# Patient Record
Sex: Female | Born: 1990 | Race: Black or African American | Hispanic: No | Marital: Single | Smoking: Former smoker
Health system: Southern US, Community
[De-identification: ages and names within clinical notes are randomized; demographics above are authoritative.]

## PROBLEM LIST (undated history)

## (undated) ENCOUNTER — Inpatient Hospital Stay (HOSPITAL_COMMUNITY): Payer: Self-pay

## (undated) DIAGNOSIS — I10 Essential (primary) hypertension: Secondary | ICD-10-CM

## (undated) DIAGNOSIS — F319 Bipolar disorder, unspecified: Secondary | ICD-10-CM

## (undated) DIAGNOSIS — T50902A Poisoning by unspecified drugs, medicaments and biological substances, intentional self-harm, initial encounter: Secondary | ICD-10-CM

## (undated) DIAGNOSIS — Z8041 Family history of malignant neoplasm of ovary: Secondary | ICD-10-CM

## (undated) DIAGNOSIS — K8019 Calculus of gallbladder with other cholecystitis with obstruction: Secondary | ICD-10-CM

## (undated) DIAGNOSIS — F329 Major depressive disorder, single episode, unspecified: Secondary | ICD-10-CM

## (undated) DIAGNOSIS — F32A Depression, unspecified: Secondary | ICD-10-CM

## (undated) DIAGNOSIS — M1990 Unspecified osteoarthritis, unspecified site: Secondary | ICD-10-CM

## (undated) HISTORY — DX: Family history of malignant neoplasm of ovary: Z80.41

---
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
## 2011-05-06 ENCOUNTER — Encounter (HOSPITAL_COMMUNITY): Payer: Self-pay | Admitting: *Deleted

## 2011-05-06 ENCOUNTER — Emergency Department (HOSPITAL_COMMUNITY)
Admission: EM | Admit: 2011-05-06 | Discharge: 2011-05-06 | Disposition: A | Discharge status: Home / Self Care | Payer: Medicaid Other | Attending: Emergency Medicine | Admitting: Emergency Medicine

## 2011-05-06 DIAGNOSIS — F17200 Nicotine dependence, unspecified, uncomplicated: Secondary | ICD-10-CM | POA: Insufficient documentation

## 2011-05-06 DIAGNOSIS — R0981 Nasal congestion: Secondary | ICD-10-CM

## 2011-05-06 DIAGNOSIS — J45909 Unspecified asthma, uncomplicated: Secondary | ICD-10-CM | POA: Insufficient documentation

## 2011-05-06 DIAGNOSIS — J3489 Other specified disorders of nose and nasal sinuses: Secondary | ICD-10-CM | POA: Insufficient documentation

## 2011-05-06 DIAGNOSIS — J029 Acute pharyngitis, unspecified: Secondary | ICD-10-CM | POA: Insufficient documentation

## 2011-05-06 DIAGNOSIS — F3289 Other specified depressive episodes: Secondary | ICD-10-CM | POA: Insufficient documentation

## 2011-05-06 DIAGNOSIS — F329 Major depressive disorder, single episode, unspecified: Secondary | ICD-10-CM | POA: Insufficient documentation

## 2011-05-06 HISTORY — DX: Depression, unspecified: F32.A

## 2011-05-06 HISTORY — DX: Major depressive disorder, single episode, unspecified: F32.9

## 2011-05-06 LAB — POCT I-STAT, CHEM 8
Glucose, Bld: 90 mg/dL (ref 70–99)
HCT: 42 % (ref 36.0–46.0)
Hemoglobin: 14.3 g/dL (ref 12.0–15.0)
Potassium: 4 mEq/L (ref 3.5–5.1)
Sodium: 139 mEq/L (ref 135–145)

## 2011-05-06 LAB — RAPID STREP SCREEN (MED CTR MEBANE ONLY): Streptococcus, Group A Screen (Direct): NEGATIVE

## 2011-05-06 MED ORDER — IBUPROFEN 800 MG PO TABS
800.0000 mg | ORAL_TABLET | Freq: Once | ORAL | Status: AC
  Administered 2011-05-06: 800 mg via ORAL
  Filled 2011-05-06: qty 1

## 2011-05-06 NOTE — ED Notes (Signed)
The pt arrived by ambulance with a cold sorethroat ear stuffiness and  Congestion for one week

## 2011-05-06 NOTE — Discharge Instructions (Signed)
Return to the ED with any concerns including shortness of breath, vomiting and not able to keep down liquids, difficulty swallowing or breathing, or any other alarming symptoms.  He used to take ibuprofen as needed for your sore throat and be sure to increase your fluid intake. He should also arrange for followup if her symptoms persist with your primary care Dr.

## 2011-05-06 NOTE — ED Provider Notes (Signed)
History     CSN: [id number]  Arrival date & time 05/06/11  2034   First MD Initiated Contact with Patient 05/06/11 2155      Chief Complaint  Patient presents with  . Sore Throat    (Consider location/radiation/quality/duration/timing/severity/associated sxs/prior treatment) HPI Patient presents with complaint of nasal congestion mild sore throat and mild congestion in her chest. She states the symptoms have been present over the past one week. She arrived via EMS. She's had no fever or difficulty breathing. She's had no vomiting. She states that she's continued to drink and eat normally. She does have a history of asthma and has been using her albuterol inhaler last time just shortly prior to arrival. It has not made any change in her symptoms. There no other associated systemic symptoms. She does not know of any specific sick contacts. There are no alleviating or modifying factors.  Symptoms are constant and described as mild  Past Medical History  Diagnosis Date  . Depression     History reviewed. No pertinent past surgical history.  No family history on file.  History  Substance Use Topics  . Smoking status: Current Everyday Smoker  . Smokeless tobacco: Not on file  . Alcohol Use: Yes    OB History    Grav Para Term Preterm Abortions TAB SAB Ect Mult Living                  Review of Systems ROS reviewed and all otherwise negative except for mentioned in HPI  Allergies  Review of patient's allergies indicates no known allergies.  Home Medications   Current Outpatient Rx  Name Route Sig Dispense Refill  . ALBUTEROL SULFATE HFA 108 (90 BASE) MCG/ACT IN AERS Inhalation Inhale 2 puffs into the lungs every 6 (six) hours as needed. For wheezing/shortness of breath    . CETIRIZINE HCL 10 MG PO TABS Oral Take 10 mg by mouth daily.      BP 119/80  Pulse 107  Temp(Src) 98.5 F (36.9 C) (Oral)  Resp 18  SpO2 99% Vitals reviewed Physical Exam Physical  Examination: General appearance - alert, well appearing, and in no distress Mental status - alert, oriented to person, place, and time Eyes - pupils equal and reactive, no conjunctival injection Mouth - mmm, op with mild erythema, no exudate, palate symmetric and uvula midline Neck - supple, no significant adenopathy Chest - clear to auscultation, no wheezes, rales or rhonchi, symmetric air entry, no increased respiratory effort Heart - normal rate, regular rhythm, normal S1, S2, no murmurs, rubs, clicks or gallops Abdomen - soft, nontender, nondistended, no masses or organomegaly Extremities - peripheral pulses normal, no pedal edema, no clubbing or cyanosis Skin - normal coloration and turgor, no rashes, no suspicious skin lesions noted Psych- laughing, cooperative  ED Course  Procedures (including critical care time)  9:59 PM went to see patient, she is not in room   Labs Reviewed  RAPID STREP SCREEN  POCT I-STAT, CHEM 8  LAB REPORT - SCANNED   No results found.   1. Pharyngitis   2. Nasal congestion       MDM   patient presenting with nasal congestion and symptoms of mild sore throat and congestion in her chest. She has been using albuterol inhaler and was noted to be mildly tachycardiac upon arrival. This did improve. She does not appear dehydrated and states that she's been drinking liquids normally. I believe the tachycardia is most likely related to recent albuterol  use.  Her lungs are clear and there is no wheezing or increased respiratory effort. A rapid strep screen was obtained which was negative. She is overall well appearing on examination. Of note patient was laughing and jovial throughout the entire examination and history. Advised her to continue her albuterol as needed and continue taking Zyrtec. She was discharged with strict return precautions and she is agreeable with this plan.        Ethelda Chick, MD 05/11/11 [phone]

## 2011-07-16 ENCOUNTER — Emergency Department (HOSPITAL_COMMUNITY)
Admission: EM | Admit: 2011-07-16 | Discharge: 2011-07-17 | Disposition: A | Discharge status: Home / Self Care | Payer: Medicaid Other | Attending: Emergency Medicine | Admitting: Emergency Medicine

## 2011-07-16 ENCOUNTER — Encounter (HOSPITAL_COMMUNITY): Payer: Self-pay

## 2011-07-16 DIAGNOSIS — T424X4A Poisoning by benzodiazepines, undetermined, initial encounter: Secondary | ICD-10-CM | POA: Insufficient documentation

## 2011-07-16 DIAGNOSIS — T424X1A Poisoning by benzodiazepines, accidental (unintentional), initial encounter: Secondary | ICD-10-CM | POA: Insufficient documentation

## 2011-07-16 DIAGNOSIS — F3289 Other specified depressive episodes: Secondary | ICD-10-CM | POA: Insufficient documentation

## 2011-07-16 DIAGNOSIS — F329 Major depressive disorder, single episode, unspecified: Secondary | ICD-10-CM | POA: Insufficient documentation

## 2011-07-16 DIAGNOSIS — F17200 Nicotine dependence, unspecified, uncomplicated: Secondary | ICD-10-CM | POA: Insufficient documentation

## 2011-07-16 DIAGNOSIS — Y92009 Unspecified place in unspecified non-institutional (private) residence as the place of occurrence of the external cause: Secondary | ICD-10-CM | POA: Insufficient documentation

## 2011-07-16 DIAGNOSIS — Z79899 Other long term (current) drug therapy: Secondary | ICD-10-CM | POA: Insufficient documentation

## 2011-07-16 DIAGNOSIS — G4700 Insomnia, unspecified: Secondary | ICD-10-CM | POA: Insufficient documentation

## 2011-07-16 HISTORY — DX: Poisoning by unspecified drugs, medicaments and biological substances, intentional self-harm, initial encounter: T50.902A

## 2011-07-16 NOTE — ED Notes (Signed)
YNW:[id number]<OZ> Expected date:07/16/11<BR> Expected time:11:22 PM<BR> Means of arrival:Ambulance<BR> Comments:<BR> Overdose

## 2011-07-16 NOTE — ED Notes (Signed)
Per [facility]- Pt reports ingestion of ativan 1mg  (20 tablets) over 2-3 hours.  Also reports ingestion of tylenol 2 tablets.  Pt present alert and active with care.  #18 LAC saline lock.  Pt denies SI states "wants to sleep"  VSS

## 2011-07-16 NOTE — ED Notes (Signed)
MD at bedside. EDP Miller 

## 2011-07-17 LAB — PREGNANCY, URINE: Preg Test, Ur: NEGATIVE

## 2011-07-17 LAB — URINALYSIS, ROUTINE W REFLEX MICROSCOPIC
Bilirubin Urine: NEGATIVE
Glucose, UA: NEGATIVE mg/dL
Hgb urine dipstick: NEGATIVE
Specific Gravity, Urine: 1.011 (ref 1.005–1.030)
pH: 7 (ref 5.0–8.0)

## 2011-07-17 LAB — COMPREHENSIVE METABOLIC PANEL
ALT: 14 U/L (ref 0–35)
Albumin: 4.1 g/dL (ref 3.5–5.2)
Alkaline Phosphatase: 65 U/L (ref 39–117)
Chloride: 100 mEq/L (ref 96–112)
Glucose, Bld: 91 mg/dL (ref 70–99)
Potassium: 4 mEq/L (ref 3.5–5.1)
Sodium: 135 mEq/L (ref 135–145)
Total Protein: 7.6 g/dL (ref 6.0–8.3)

## 2011-07-17 LAB — CBC
Hemoglobin: 13.2 g/dL (ref 12.0–15.0)
MCHC: 33.6 g/dL (ref 30.0–36.0)
RDW: 12.7 % (ref 11.5–15.5)
WBC: 5.6 10*3/uL (ref 4.0–10.5)

## 2011-07-17 LAB — ACETAMINOPHEN LEVEL: Acetaminophen (Tylenol), Serum: 19.4 ug/mL (ref 10–30)

## 2011-07-17 LAB — RAPID URINE DRUG SCREEN, HOSP PERFORMED: Tetrahydrocannabinol: NOT DETECTED

## 2011-07-17 LAB — SALICYLATE LEVEL: Salicylate Lvl: <2 mg/dL — ABNORMAL LOW (ref 2.8–20.0)

## 2011-07-17 NOTE — ED Provider Notes (Signed)
History     CSN: [id number]  Arrival date & time 07/16/11  2336   First MD Initiated Contact with Patient 07/16/11 2358      Chief Complaint  Patient presents with  . Drug Overdose    (Consider location/radiation/quality/duration/timing/severity/associated sxs/prior treatment) HPI Comments: 21 year old female with a history of recently being diagnosed with bipolar disorder and depression who also has been given a sleeping pill for difficulty with sleep. She states that this evening she had been having a tough time going to sleep and states that she wanted to get her schedule on track for starting summer school. She took 2 Ativan tablets of 1 mg each, this did not do anything so short time later she took 3 tablets, then 4 tablets, then 5 tablets. After taking approximately 20 mg of Ativan and 2 tablets of acetaminophen and not being sleepy, she called her girlfriend to let her to. Her girlfriend called the ambulance for transport to 2 overdose. The patient denies being depressed or suicidal but does state that she has some stressors including difficulty paying rent which is due tomorrow. She states that she has recently been in counseling discussions with her girlfriend because of difficulties with their relationship but states that it is going well at this time. She adamantly denies that this was a suicide attempt but states that it was wanting to go to sleep.  Patient is a 21 y.o. female presenting with Overdose. The history is provided by the patient and the EMS personnel.  Drug Overdose    Past Medical History  Diagnosis Date  . Depression   . Suicidal overdose     History reviewed. No pertinent past surgical history.  No family history on file.  History  Substance Use Topics  . Smoking status: Current Everyday Smoker  . Smokeless tobacco: Not on file  . Alcohol Use: Yes    OB History    Grav Para Term Preterm Abortions TAB SAB Ect Mult Living                  Review  of Systems  All other systems reviewed and are negative.    Allergies  Review of patient's allergies indicates no known allergies.  Home Medications   Current Outpatient Rx  Name Route Sig Dispense Refill  . ACETAMINOPHEN 500 MG PO TABS Oral Take 500 mg by mouth every 6 (six) hours as needed. For pain    . ALBUTEROL SULFATE HFA 108 (90 BASE) MCG/ACT IN AERS Inhalation Inhale 2 puffs into the lungs every 6 (six) hours as needed. For wheezing/shortness of breath    . CETIRIZINE HCL 10 MG PO TABS Oral Take 10 mg by mouth daily.    Marland Kitchen LORAZEPAM 1 MG PO TABS Oral Take 0.5-1 mg by mouth at bedtime as needed. For sleep      BP 138/99  Pulse 118  Temp 98.9 F (37.2 C) (Oral)  Resp 20  SpO2 100%  Physical Exam  Nursing note and vitals reviewed. Constitutional: She appears well-developed and well-nourished.  HENT:  Head: Normocephalic and atraumatic.  Mouth/Throat: Oropharynx is clear and moist. No oropharyngeal exudate.  Eyes: Conjunctivae and EOM are normal. Pupils are equal, round, and reactive to light. Right eye exhibits no discharge. Left eye exhibits no discharge. No scleral icterus.  Neck: Normal range of motion. Neck supple. No JVD present. No thyromegaly present.  Cardiovascular: Normal rate, regular rhythm, normal heart sounds and intact distal pulses.  Exam reveals no gallop and  no friction rub.   No murmur heard. Pulmonary/Chest: Effort normal and breath sounds normal. No respiratory distress. She has no wheezes. She has no rales.  Abdominal: Soft. Bowel sounds are normal. She exhibits no distension and no mass. There is no tenderness.  Musculoskeletal: Normal range of motion. She exhibits no edema and no tenderness.  Lymphadenopathy:    She has no cervical adenopathy.  Neurological: She is alert. Coordination normal.       Clear speech, normal coordination, no tremor, no facial droop  Skin: Skin is warm and dry. No rash noted. No erythema.  Psychiatric:       Tearful,  no suicidal thoughts, no hallucinations, depressed affect    ED Course  Procedures (including critical care time)  Labs Reviewed  SALICYLATE LEVEL - Abnormal; Notable for the following:    Salicylate Lvl <2.0 (*)     All other components within normal limits  CBC  COMPREHENSIVE METABOLIC PANEL  ETHANOL  URINE RAPID DRUG SCREEN (HOSP PERFORMED)  URINALYSIS, ROUTINE W REFLEX MICROSCOPIC  PREGNANCY, URINE  ACETAMINOPHEN LEVEL   No results found.   1. Overdose of benzodiazepine   2. Insomnia       MDM  At this time the patient does appear depressed and tearful, she denies that there was any part of this that was suicidal, will wait for significant other to get further information regarding the patient's mental state at this time. We'll have behavioral health assessment team to evaluate patient as well. Levels ordered,  blood pressure stable at this time. EKG and coingestants labs ordered.   ED ECG REPORT   Date: 07/17/2011 I personally interpreted this ECG  Rate: 110  Rhythm: sinus tachycardia  QRS Axis: normal  Intervals: normal  ST/T Wave abnormalities: normal  Conduction Disutrbances:none  Narrative Interpretation:   Old EKG Reviewed: c/w 04/30/10, T wave abnormalities less abnormal in lateral precordial leads  Patient has been evaluated by ACT team who has agreed that she is stable for discharge, this was not a suicide attempt. Patient has been sleeping, easily arousable and agreeable to discharge with return should her symptoms worsen. Laboratory workup otherwise unremarkable    Vida Roller, MD 07/17/11 [phone]

## 2011-07-17 NOTE — ED Notes (Signed)
ACT team spoke with this pt

## 2011-07-17 NOTE — ED Notes (Signed)
Pt resting with eyes closed.
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
## 2011-07-17 NOTE — Discharge Instructions (Signed)
Please don't take any more of the ativan - this is not a sleep medicine.  It is a sedative and will make you sleepy and groggy throughout the day today.  If you develop worsening depression or any suicidal thoughts return to the hospital immediately. Call your therapist in the morning to schedule a followup appointment to reevaluate your difficulty sleeping and ongoing depression.

## 2011-07-17 NOTE — ED Notes (Signed)
Pt changed into blue scrubs, wanded by [employer]. Pt has piercing on bottom of lip left side that will not come out (imbeded in lip). Charge RN made aware.

## 2011-07-17 NOTE — ED Notes (Signed)
Gave pt. [place] sandwich, sprite and cheese stick after she stated she was hungry and RN cleared her to eat.

## 2011-08-17 ENCOUNTER — Encounter (HOSPITAL_COMMUNITY): Payer: Self-pay | Admitting: *Deleted

## 2011-08-17 ENCOUNTER — Emergency Department (HOSPITAL_COMMUNITY)
Admission: EM | Admit: 2011-08-17 | Discharge: 2011-08-18 | Disposition: A | Discharge status: Home / Self Care | Payer: Medicaid Other | Source: Home / Self Care | Attending: Emergency Medicine | Admitting: Emergency Medicine

## 2011-08-17 DIAGNOSIS — F329 Major depressive disorder, single episode, unspecified: Secondary | ICD-10-CM | POA: Insufficient documentation

## 2011-08-17 DIAGNOSIS — J069 Acute upper respiratory infection, unspecified: Secondary | ICD-10-CM | POA: Insufficient documentation

## 2011-08-17 DIAGNOSIS — Z79899 Other long term (current) drug therapy: Secondary | ICD-10-CM | POA: Insufficient documentation

## 2011-08-17 DIAGNOSIS — B354 Tinea corporis: Secondary | ICD-10-CM | POA: Insufficient documentation

## 2011-08-17 DIAGNOSIS — F17200 Nicotine dependence, unspecified, uncomplicated: Secondary | ICD-10-CM | POA: Insufficient documentation

## 2011-08-17 DIAGNOSIS — F3289 Other specified depressive episodes: Secondary | ICD-10-CM | POA: Insufficient documentation

## 2011-08-17 NOTE — ED Notes (Addendum)
Per [facility] - pt c/o congestion, sore throat, n/v, and fever that began approx 2000 this evening.

## 2011-08-18 ENCOUNTER — Encounter (HOSPITAL_COMMUNITY): Payer: Self-pay | Admitting: Family Medicine

## 2011-08-18 ENCOUNTER — Emergency Department (HOSPITAL_COMMUNITY)
Admission: EM | Admit: 2011-08-18 | Discharge: 2011-08-18 | Disposition: A | Discharge status: Home / Self Care | Payer: Medicaid Other | Attending: Emergency Medicine | Admitting: Emergency Medicine

## 2011-08-18 DIAGNOSIS — B49 Unspecified mycosis: Secondary | ICD-10-CM

## 2011-08-18 MED ORDER — PSEUDOEPHEDRINE HCL ER 120 MG PO TB12: 120.0000 mg | ORAL_TABLET | Freq: Two times a day (BID) | ORAL | Status: DC

## 2011-08-18 MED ORDER — CLOTRIMAZOLE 1 % EX CREA: TOPICAL_CREAM | CUTANEOUS | Status: DC

## 2011-08-18 MED ORDER — PSEUDOEPHEDRINE HCL ER 120 MG PO TB12
120.0000 mg | ORAL_TABLET | Freq: Two times a day (BID) | ORAL | Status: DC
  Administered 2011-08-18: 120 mg via ORAL
  Filled 2011-08-18: qty 1

## 2011-08-18 NOTE — ED Notes (Signed)
Patient states that she was here earlier for a sinus infection and thinks she may be having a reaction to the medication she received. States that approx 2 hours after leaving she noticed "bruising" to her left side. Area of redness noted underneath left breast.

## 2011-08-18 NOTE — ED Provider Notes (Signed)
Medical screening examination/treatment/procedure(s) were performed by non-physician practitioner and as supervising physician I was immediately available for consultation/collaboration.  Jasmine Awe, MD 08/18/11 [phone]

## 2011-08-18 NOTE — ED Provider Notes (Addendum)
History     CSN: [id number]  Arrival date & time 08/17/11  2213   First MD Initiated Contact with Patient 08/18/11 0001      Chief Complaint  Patient presents with  . Nasal Congestion  . Emesis    (Consider location/radiation/quality/duration/timing/severity/associated sxs/prior treatment) HPI Comments: URI symptoms   The history is provided by the patient.    Past Medical History  Diagnosis Date  . Depression   . Suicidal overdose     History reviewed. No pertinent past surgical history.  History reviewed. No pertinent family history.  History  Substance Use Topics  . Smoking status: Current Everyday Smoker  . Smokeless tobacco: Not on file  . Alcohol Use: Yes    OB History    Grav Para Term Preterm Abortions TAB SAB Ect Mult Living                  Review of Systems  Constitutional: Positive for fever. Negative for chills.  HENT: Positive for congestion and sore throat.   Gastrointestinal: Positive for nausea and vomiting.  Neurological: Positive for headaches. Negative for dizziness and weakness.    Allergies  Review of patient's allergies indicates no known allergies.  Home Medications   Current Outpatient Rx  Name Route Sig Dispense Refill  . ACETAMINOPHEN 500 MG PO TABS Oral Take 500 mg by mouth every 6 (six) hours as needed. For pain    . ALBUTEROL SULFATE HFA 108 (90 BASE) MCG/ACT IN AERS Inhalation Inhale 2 puffs into the lungs every 6 (six) hours as needed. For wheezing/shortness of breath    . CETIRIZINE HCL 10 MG PO TABS Oral Take 10 mg by mouth daily.    Marland Kitchen LORAZEPAM 1 MG PO TABS Oral Take 0.5-1 mg by mouth at bedtime as needed. For sleep    . PSEUDOEPHEDRINE HCL ER 120 MG PO TB12 Oral Take 1 tablet (120 mg total) by mouth every 12 (twelve) hours. 20 tablet 0    BP 117/67  Pulse 74  Temp 98.4 F (36.9 C) (Oral)  Resp 22  Ht 5\' 5"  (1.651 m)  SpO2 100%  Physical Exam  Constitutional: She is oriented to person, place, and time. She  appears well-developed.  HENT:  Head: Normocephalic.  Eyes: Pupils are equal, round, and reactive to light.  Neck: Normal range of motion.  Cardiovascular: Normal rate.   Pulmonary/Chest: Effort normal. No respiratory distress. She has no wheezes. She has no rales.  Abdominal: Soft. She exhibits no distension. There is no tenderness.  Musculoskeletal: Normal range of motion.  Neurological: She is alert and oriented to person, place, and time.  Skin: Skin is warm.    ED Course  Procedures (including critical care time)  Labs Reviewed - No data to display No results found.   1. URI (upper respiratory infection)       MDM  URI        Arman Filter, NP 08/18/11 0038  Arman Filter, NP 10/19/11 2005

## 2011-08-18 NOTE — ED Provider Notes (Signed)
Medical screening examination/treatment/procedure(s) were performed by non-physician practitioner and as supervising physician I was immediately available for consultation/collaboration.  Rosemary Mossbarger K Yolanda Huffstetler-Rasch, MD 08/18/11 0414 

## 2011-08-18 NOTE — ED Provider Notes (Signed)
History     CSN: [id number]  Arrival date & time 08/18/11  0228   None     Chief Complaint  Patient presents with  . Rash    (Consider location/radiation/quality/duration/timing/severity/associated sxs/prior treatment) HPI  Patient to the ER for complaints of "bruise" to left ribs. She says that she was seen here in the ED last night and treated with Sudafed for nasal congestion. After leaving the hospital she noticed that she developed a tender area under her left breast. It does not itch, it is not bleeding, their is no puss. Denies trauma or injury. She denies fevers or the use of antibiotics. She denies having had this reaction in the past. VSS/NAD, afebrile,   Past Medical History  Diagnosis Date  . Depression   . Suicidal overdose     History reviewed. No pertinent past surgical history.  No family history on file.  History  Substance Use Topics  . Smoking status: Current Everyday Smoker  . Smokeless tobacco: Not on file  . Alcohol Use: Yes    OB History    Grav Para Term Preterm Abortions TAB SAB Ect Mult Living                  Review of Systems   HEENT: denies blurry vision or change in hearing PULMONARY: Denies difficulty breathing and SOB CARDIAC: denies chest pain or heart palpitations SKIN: + new rash PSYCH: patient denies anxiety or depression. NECK: Pt denies having neck pain     Allergies  Review of patient's allergies indicates no known allergies.  Home Medications   Current Outpatient Rx  Name Route Sig Dispense Refill  . ACETAMINOPHEN 500 MG PO TABS Oral Take 500 mg by mouth every 6 (six) hours as needed. For pain    . ALBUTEROL SULFATE HFA 108 (90 BASE) MCG/ACT IN AERS Inhalation Inhale 2 puffs into the lungs every 6 (six) hours as needed. For wheezing/shortness of breath    . CETIRIZINE HCL 10 MG PO TABS Oral Take 10 mg by mouth daily.    Marland Kitchen LORAZEPAM 1 MG PO TABS Oral Take 0.5-1 mg by mouth at bedtime as needed. For sleep    .  PSEUDOEPHEDRINE HCL ER 120 MG PO TB12 Oral Take 1 tablet (120 mg total) by mouth every 12 (twelve) hours. 20 tablet 0  . CLOTRIMAZOLE 1 % EX CREA  Apply to affected area 2 times daily 15 g 0    BP 124/72  Pulse 83  Temp 98.5 F (36.9 C) (Oral)  Resp 18  SpO2 100%  LMP 08/09/2011  Physical Exam  Nursing note and vitals reviewed. Constitutional: She appears well-developed and well-nourished. No distress.  HENT:  Head: Normocephalic and atraumatic.  Eyes: Pupils are equal, round, and reactive to light.  Neck: Normal range of motion. Neck supple.  Cardiovascular: Normal rate and regular rhythm.   Pulmonary/Chest: Effort normal.  Abdominal: Soft.  Neurological: She is alert.  Skin: Skin is warm and dry. Rash noted.          Their is a small area of raw appearing skin under the left breast that measures approx 5 cm x 4 cm in a circular formation with central clearing. She states that it is tender and slightly itchy.  No induration, crepitus, drainage.     ED Course  Procedures (including critical care time)  Labs Reviewed - No data to display No results found.   1. Fungal infection       MDM  Pt Rx antifungal cream and education about keeping skin under breasts dry, such as using powders under her breasts.  Pt has been advised of the symptoms that warrant their return to the ED. Patient has voiced understanding and has agreed to follow-up with the PCP or specialist.         Dorthula Matas, PA 08/18/11 [phone]

## 2011-09-13 ENCOUNTER — Emergency Department (HOSPITAL_COMMUNITY): Payer: Medicaid Other

## 2011-09-13 ENCOUNTER — Emergency Department (HOSPITAL_COMMUNITY)
Admission: EM | Admit: 2011-09-13 | Discharge: 2011-09-13 | Disposition: A | Discharge status: Home / Self Care | Payer: Medicaid Other | Attending: Emergency Medicine | Admitting: Emergency Medicine

## 2011-09-13 ENCOUNTER — Encounter (HOSPITAL_COMMUNITY): Payer: Self-pay | Admitting: Emergency Medicine

## 2011-09-13 DIAGNOSIS — IMO0002 Reserved for concepts with insufficient information to code with codable children: Secondary | ICD-10-CM | POA: Insufficient documentation

## 2011-09-13 DIAGNOSIS — Y92009 Unspecified place in unspecified non-institutional (private) residence as the place of occurrence of the external cause: Secondary | ICD-10-CM | POA: Insufficient documentation

## 2011-09-13 DIAGNOSIS — F319 Bipolar disorder, unspecified: Secondary | ICD-10-CM | POA: Insufficient documentation

## 2011-09-13 DIAGNOSIS — S6990XA Unspecified injury of unspecified wrist, hand and finger(s), initial encounter: Secondary | ICD-10-CM | POA: Insufficient documentation

## 2011-09-13 DIAGNOSIS — F17200 Nicotine dependence, unspecified, uncomplicated: Secondary | ICD-10-CM | POA: Insufficient documentation

## 2011-09-13 HISTORY — DX: Bipolar disorder, unspecified: F31.9

## 2011-09-13 MED ORDER — IBUPROFEN 800 MG PO TABS
800.0000 mg | ORAL_TABLET | Freq: Once | ORAL | Status: AC
  Administered 2011-09-13: 800 mg via ORAL
  Filled 2011-09-13: qty 1

## 2011-09-13 NOTE — ED Notes (Signed)
HQI:ON62<XB> Expected date:<BR> Expected time:<BR> Means of arrival:<BR> Comments:<BR> EMS/punched a wall-hand pain

## 2011-09-13 NOTE — ED Provider Notes (Signed)
History     CSN: [id number]  Arrival date & time 09/13/11  0208   First MD Initiated Contact with Patient 09/13/11 0240      Chief Complaint  Patient presents with  . Hand Injury    (Consider location/radiation/quality/duration/timing/severity/associated sxs/prior treatment) Patient is a 21 y.o. female presenting with hand injury. The history is provided by the patient.  Hand Injury  The incident occurred less than 1 hour ago. The incident occurred at home. The injury mechanism was a direct blow. The pain is present in the right hand. The quality of the pain is described as aching. The pain is at a severity of 4/10. The pain is mild. The pain has been constant since the incident. Pertinent negatives include no fever. The symptoms are aggravated by movement and palpation. She has tried nothing for the symptoms.    Past Medical History  Diagnosis Date  . Depression   . Suicidal overdose   . Bipolar 1 disorder     History reviewed. No pertinent past surgical history.  No family history on file.  History  Substance Use Topics  . Smoking status: Current Some Day Smoker  . Smokeless tobacco: Never Used  . Alcohol Use: 12.0 oz/week    20 Shots of liquor per week     vodka 2-3 times weekly    OB History    Grav Para Term Preterm Abortions TAB SAB Ect Mult Living                  Review of Systems  Constitutional: Negative for fever.  Musculoskeletal: Positive for joint swelling.  Neurological: Negative for weakness and numbness.    Allergies  Review of patient's allergies indicates no known allergies.  Home Medications   Current Outpatient Rx  Name Route Sig Dispense Refill  . ACETAMINOPHEN 500 MG PO TABS Oral Take 500 mg by mouth every 6 (six) hours as needed. For pain    . ALBUTEROL SULFATE HFA 108 (90 BASE) MCG/ACT IN AERS Inhalation Inhale 2 puffs into the lungs every 6 (six) hours as needed. For wheezing/shortness of breath    . CETIRIZINE HCL 10 MG PO TABS  Oral Take 10 mg by mouth daily.    Marland Kitchen CLOTRIMAZOLE 1 % EX CREA  Apply to affected area 2 times daily 15 g 0  . LORAZEPAM 1 MG PO TABS Oral Take 0.5-1 mg by mouth at bedtime as needed. For sleep    . PSEUDOEPHEDRINE HCL ER 120 MG PO TB12 Oral Take 1 tablet (120 mg total) by mouth every 12 (twelve) hours. 20 tablet 0    BP 118/69  Pulse 119  Temp 98.5 F (36.9 C) (Oral)  Resp 20  SpO2 97%  LMP 08/09/2011  Physical Exam  Constitutional: She appears well-nourished.  Eyes: Pupils are equal, round, and reactive to light.  Cardiovascular: Normal rate.   Musculoskeletal: She exhibits tenderness. She exhibits no edema.       Hands: Skin: Skin is warm and dry. There is erythema.    ED Course  Procedures (including critical care time)  Labs Reviewed - No data to display Dg Hand Complete Right  09/13/2011  *RADIOLOGY REPORT*  Clinical Data: Hand pain status post trauma.  RIGHT HAND - COMPLETE 3+ VIEW  Comparison: 04/13/2010  Findings: Suboptimal examination as the patient is unable to extend her fingers.  Furthermore, due to the positioning, the carpal bones/base of the metacarpals are nondiagnostic, requiring dedicated wrist views if there is clinical concern  at this level. Soft tissue swelling overlies the dorsum of the hand. No displaced fracture or dislocation identified.  IMPRESSION: Flexed second through fifth digits at the PIP joints limits osseous evaluation.  No displaced fracture is identified. See above.   Original Report Authenticated By: Waneta Martins, M.D.      1. Hand injury       MDM  Will review xray         Arman Filter, NP 09/13/11 0325  Arman Filter, NP 09/13/11 [phone]

## 2011-09-13 NOTE — ED Provider Notes (Signed)
Medical screening examination/treatment/procedure(s) were performed by non-physician practitioner and as supervising physician I was immediately available for consultation/collaboration.   Lyanne Co, MD 09/13/11 [phone]

## 2011-09-13 NOTE — ED Notes (Signed)
While receiving bedside reporting, pt stated she wanted to know when "the woman was going to look at her hand." She then began to say she is impatient and does not have a problem punching one of our walls. Stated she "wouldn't break the wall", but "would just punch it. " Instructed pt to not punch the wall because there would be consequences to her actions if performed. Nurse Vance Gather notified.

## 2011-09-13 NOTE — ED Notes (Signed)
Pt "punched" wall, (fiber board) with right hand, has mild swelling, c/o pain in right hand. Called EMS, not treatment PTA, refused to use ice that was offered. Pt demeanor giggly, and euphoric upon arrival

## 2011-10-20 NOTE — ED Provider Notes (Signed)
Medical screening examination/treatment/procedure(s) were performed by non-physician practitioner and as supervising physician I was immediately available for consultation/collaboration.  Jasmine Awe, MD 10/20/11 [phone]

## 2012-03-03 ENCOUNTER — Emergency Department (HOSPITAL_COMMUNITY)
Admission: EM | Admit: 2012-03-03 | Discharge: 2012-03-03 | Disposition: A | Discharge status: Home / Self Care | Payer: Medicaid Other | Attending: Emergency Medicine | Admitting: Emergency Medicine

## 2012-03-03 ENCOUNTER — Encounter (HOSPITAL_COMMUNITY): Payer: Self-pay | Admitting: Emergency Medicine

## 2012-03-03 DIAGNOSIS — Z3202 Encounter for pregnancy test, result negative: Secondary | ICD-10-CM | POA: Insufficient documentation

## 2012-03-03 DIAGNOSIS — M549 Dorsalgia, unspecified: Secondary | ICD-10-CM | POA: Insufficient documentation

## 2012-03-03 DIAGNOSIS — F17200 Nicotine dependence, unspecified, uncomplicated: Secondary | ICD-10-CM | POA: Insufficient documentation

## 2012-03-03 DIAGNOSIS — R1110 Vomiting, unspecified: Secondary | ICD-10-CM | POA: Insufficient documentation

## 2012-03-03 DIAGNOSIS — Z8659 Personal history of other mental and behavioral disorders: Secondary | ICD-10-CM | POA: Insufficient documentation

## 2012-03-03 DIAGNOSIS — R1084 Generalized abdominal pain: Secondary | ICD-10-CM | POA: Insufficient documentation

## 2012-03-03 LAB — POCT I-STAT, CHEM 8
Calcium, Ion: 1.1 mmol/L — ABNORMAL LOW (ref 1.12–1.23)
Glucose, Bld: 102 mg/dL — ABNORMAL HIGH (ref 70–99)
HCT: 39 % (ref 36.0–46.0)
Hemoglobin: 13.3 g/dL (ref 12.0–15.0)

## 2012-03-03 LAB — POCT PREGNANCY, URINE: Preg Test, Ur: NEGATIVE

## 2012-03-03 MED ORDER — ONDANSETRON 4 MG PO TBDP
4.0000 mg | ORAL_TABLET | Freq: Once | ORAL | Status: AC
  Administered 2012-03-03: 4 mg via ORAL
  Filled 2012-03-03: qty 1

## 2012-03-03 MED ORDER — KETOROLAC TROMETHAMINE 60 MG/2ML IM SOLN
60.0000 mg | Freq: Once | INTRAMUSCULAR | Status: AC
  Administered 2012-03-03: 60 mg via INTRAMUSCULAR
  Filled 2012-03-03: qty 2

## 2012-03-03 MED ORDER — ONDANSETRON HCL 4 MG PO TABS: 4.0000 mg | ORAL_TABLET | Freq: Three times a day (TID) | ORAL | Status: DC | PRN

## 2012-03-03 NOTE — ED Provider Notes (Signed)
History     CSN: [id number]  Arrival date & time 03/03/12  1123   First MD Initiated Contact with Patient 03/03/12 1201      Chief Complaint  Patient presents with  . Emesis  . Back Pain    (Consider location/radiation/quality/duration/timing/severity/associated sxs/prior treatment) HPI Comments: This is a 22 year old, female, PMH of bipolar and depression, who presents to the emergency department with a chief complaint of vomiting.  Patient states that the vomiting started this morning.  She denies any gross blood, but states that after she finished vomiting, she noticed a little bit of streaking.  She also endorses crampy abdominal pain, which she believes is from the retching.  She denies fever, chills, CP, SOB, diarrhea, constipation, numbness or tingling of the extremities.  She states that her pain is moderate now.  She has not tried taking anything to alleviate her symptoms.  Nothing makes her symptoms better or worse.  The history is provided by the patient. No language interpreter was used.    Past Medical History  Diagnosis Date  . Depression   . Suicidal overdose   . Bipolar 1 disorder     History reviewed. No pertinent past surgical history.  No family history on file.  History  Substance Use Topics  . Smoking status: Current Every Day Smoker    Types: Cigarettes  . Smokeless tobacco: Never Used  . Alcohol Use: No     Comment: vodka 2-3 times weekly; quit 02/18/12    OB History   Grav Para Term Preterm Abortions TAB SAB Ect Mult Living                  Review of Systems  All other systems reviewed and are negative.    Allergies  Review of patient's allergies indicates no known allergies.  Home Medications   Current Outpatient Rx  Name  Route  Sig  Dispense  Refill  . albuterol (PROVENTIL HFA;VENTOLIN HFA) 108 (90 BASE) MCG/ACT inhaler   Inhalation   Inhale 2 puffs into the lungs every 6 (six) hours as needed. For wheezing/shortness of breath          . cetirizine (ZYRTEC) 10 MG tablet   Oral   Take 10 mg by mouth daily.           BP 118/54  Pulse 82  Temp(Src) 98.5 F (36.9 C) (Oral)  Resp 20  SpO2 100%  LMP 10/02/2011  Physical Exam  Nursing note and vitals reviewed. Constitutional: She is oriented to person, place, and time. She appears well-developed and well-nourished.  HENT:  Head: Normocephalic and atraumatic.  Eyes: Conjunctivae and EOM are normal. Pupils are equal, round, and reactive to light.  Neck: Normal range of motion. Neck supple.  Cardiovascular: Normal rate and regular rhythm.  Exam reveals no gallop and no friction rub.   No murmur heard. Pulmonary/Chest: Effort normal and breath sounds normal. No respiratory distress. She has no wheezes. She has no rales. She exhibits no tenderness.  Abdominal: Soft. Bowel sounds are normal. She exhibits no distension and no mass. There is no tenderness. There is no rebound and no guarding.  Mild diffuse abdominal discomfort with palpation, no RLQ tenderness, no Murphy's sign, no peritoneal signs.  Musculoskeletal: Normal range of motion. She exhibits no edema and no tenderness.  1x1 cm ganglion cyst on anterior left wrist, no signs of infection  Neurological: She is alert and oriented to person, place, and time.  Skin: Skin is  warm and dry.  Psychiatric: She has a normal mood and affect. Her behavior is normal. Judgment and thought content normal.  Inappropriate at times, overly dramatic    ED Course  Procedures (including critical care time)  Labs Reviewed  POCT I-STAT, CHEM 8 - Abnormal; Notable for the following:    Glucose, Bld 102 (*)    Calcium, Ion 1.10 (*)    All other components within normal limits   Results for orders placed during the hospital encounter of 03/03/12  POCT I-STAT, CHEM 8      Result Value Range   Sodium 142  135 - 145 mEq/L   Potassium 3.9  3.5 - 5.1 mEq/L   Chloride 104  96 - 112 mEq/L   BUN 8  6 - 23 mg/dL   Creatinine,  Ser 1.61  0.50 - 1.10 mg/dL   Glucose, Bld 096 (*) 70 - 99 mg/dL   Calcium, Ion 0.45 (*) 1.12 - 1.23 mmol/L   TCO2 30  0 - 100 mmol/L   Hemoglobin 13.3  12.0 - 15.0 g/dL   HCT 40.9  81.1 - 91.4 %  POCT PREGNANCY, URINE      Result Value Range   Preg Test, Ur NEGATIVE  NEGATIVE       1. Vomiting       MDM  22 year old female with vomiting and back pain.  Suspect the back pain to be musculoskeletal in nature as it is reproducible with palpation.  I have treated the patient's pain with toradol in the ED, have also given Zofran and fluid challenged the patient.  Believe the patient's abdominal discomfort to be due to retching.  I am going to discharge the patient to home with PCP follow up.  Encouraged her to see her PCP regarding the ganglion cyst, as this is not an emergent issue.  The patient understands and agrees with the plan.  She is stable and ready for discharge.        Roxy Horseman, PA-C 03/03/12 1446

## 2012-03-03 NOTE — ED Notes (Signed)
Pt laughing, giggling with friend, faint smell of ETOH in room- pt denies drinking last night

## 2012-03-03 NOTE — ED Notes (Signed)
Also states has not had a period in 5 months. Admits to drinking heavily on the weekends- goes to college

## 2012-03-03 NOTE — ED Provider Notes (Signed)
Medical screening examination/treatment/procedure(s) were performed by non-physician practitioner and as supervising physician I was immediately available for consultation/collaboration.  Tobin Chad, MD 03/03/12 [phone]

## 2012-03-03 NOTE — ED Notes (Signed)
Fluid challenge- gingerale given to drink

## 2012-03-03 NOTE — Discharge Instructions (Signed)
Nausea and Vomiting Nausea is a sick feeling that often comes before throwing up (vomiting). Vomiting is a reflex where stomach contents come out of your mouth. Vomiting can cause severe loss of body fluids (dehydration). Children and elderly adults can become dehydrated quickly, especially if they also have diarrhea. Nausea and vomiting are symptoms of a condition or disease. It is important to find the cause of your symptoms. CAUSES   Direct irritation of the stomach lining. This irritation can result from increased acid production (gastroesophageal reflux disease), infection, food poisoning, taking certain medicines (such as nonsteroidal anti-inflammatory drugs), alcohol use, or tobacco use.  Signals from the brain.These signals could be caused by a headache, heat exposure, an inner ear disturbance, increased pressure in the brain from injury, infection, a tumor, or a concussion, pain, emotional stimulus, or metabolic problems.  An obstruction in the gastrointestinal tract (bowel obstruction).  Illnesses such as diabetes, hepatitis, gallbladder problems, appendicitis, kidney problems, cancer, sepsis, atypical symptoms of a heart attack, or eating disorders.  Medical treatments such as chemotherapy and radiation.  Receiving medicine that makes you sleep (general anesthetic) during surgery. DIAGNOSIS Your caregiver may ask for tests to be done if the problems do not improve after a few days. Tests may also be done if symptoms are severe or if the reason for the nausea and vomiting is not clear. Tests may include:  Urine tests.  Blood tests.  Stool tests.  Cultures (to look for evidence of infection).  X-rays or other imaging studies. Test results can help your caregiver make decisions about treatment or the need for additional tests. TREATMENT You need to stay well hydrated. Drink frequently but in small amounts.You may wish to drink water, sports drinks, clear broth, or eat frozen  ice pops or gelatin dessert to help stay hydrated.When you eat, eating slowly may help prevent nausea.There are also some antinausea medicines that may help prevent nausea. HOME CARE INSTRUCTIONS   Take all medicine as directed by your caregiver.  If you do not have an appetite, do not force yourself to eat. However, you must continue to drink fluids.  If you have an appetite, eat a normal diet unless your caregiver tells you differently.  Eat a variety of complex carbohydrates (rice, wheat, potatoes, bread), lean meats, yogurt, fruits, and vegetables.  Avoid high-fat foods because they are more difficult to digest.  Drink enough water and fluids to keep your urine clear or pale yellow.  If you are dehydrated, ask your caregiver for specific rehydration instructions. Signs of dehydration may include:  Severe thirst.  Dry lips and mouth.  Dizziness.  Dark urine.  Decreasing urine frequency and amount.  Confusion.  Rapid breathing or pulse. SEEK IMMEDIATE MEDICAL CARE IF:   You have blood or brown flecks (like coffee grounds) in your vomit.  You have black or bloody stools.  You have a severe headache or stiff neck.  You are confused.  You have severe abdominal pain.  You have chest pain or trouble breathing.  You do not urinate at least once every 8 hours.  You develop cold or clammy skin.  You continue to vomit for longer than 24 to 48 hours.  You have a fever. MAKE SURE YOU:   Understand these instructions.  Will watch your condition.  Will get help right away if you are not doing well or get worse. Document Released: 01/02/2005 Document Revised: 03/27/2011 Document Reviewed: 06/01/2010 [employer] Patient Information 2013 [employer].  

## 2012-03-03 NOTE — ED Notes (Signed)
Pt c/o low back pain x 2 days, abdominal pain and  vomiting onset today. Pt also c/o finding a knot on left wrist this morning, pt denies pain to area of injury.

## 2012-04-29 ENCOUNTER — Encounter: Payer: Self-pay | Admitting: Obstetrics

## 2012-04-29 ENCOUNTER — Ambulatory Visit (INDEPENDENT_AMBULATORY_CARE_PROVIDER_SITE_OTHER): Payer: Medicaid Other | Admitting: Obstetrics

## 2012-04-29 VITALS — BP 124/85 | HR 80 | Temp 98.3°F | Ht 66.0 in | Wt 218.0 lb

## 2012-04-29 DIAGNOSIS — N912 Amenorrhea, unspecified: Secondary | ICD-10-CM

## 2012-04-29 DIAGNOSIS — Z113 Encounter for screening for infections with a predominantly sexual mode of transmission: Secondary | ICD-10-CM

## 2012-04-29 DIAGNOSIS — N760 Acute vaginitis: Secondary | ICD-10-CM

## 2012-04-29 DIAGNOSIS — N766 Ulceration of vulva: Secondary | ICD-10-CM

## 2012-04-29 DIAGNOSIS — F329 Major depressive disorder, single episode, unspecified: Secondary | ICD-10-CM

## 2012-04-29 DIAGNOSIS — N921 Excessive and frequent menstruation with irregular cycle: Secondary | ICD-10-CM

## 2012-04-29 MED ORDER — NORETHIN ACE-ETH ESTRAD-FE 1-20 MG-MCG(24) PO TABS: 1.0000 | ORAL_TABLET | Freq: Every day | ORAL | Status: DC

## 2012-04-29 NOTE — Patient Instructions (Signed)
Contraception options. AUB

## 2012-04-29 NOTE — Progress Notes (Signed)
.   Subjective:     [name] [name] is a 22 y.o. female here for a problem visit.   Current complaints - she has not had a cycle since October 2013 until this past Friday and she only spotted for 2 days.  She has a painful bump on the outside of her vagina.  Personal health questionnaire reviewed: yes.   Gynecologic History Patient's last menstrual period was 04/26/2012. Contraception: none Last Pap: N/A Last mammogram: N/A    Common ambulatory SmartLinks:[id number]}  Review of Systems Pertinent items are noted in HPI.    Objective:    Pelvic: cervix normal in appearance, no adnexal masses or tenderness, no cervical motion tenderness, uterus normal size, shape, and consistency, vagina normal without discharge and ulceration upper left labia majora.  Herpes culture done.    Assessment:    Metrorrhagia.  R/O genital herpes.   Plan:    Education reviewed: Contraeption options..    Loestrin 24 Fe Rx for regulation of cycles.  Herpes culture done.

## 2012-04-30 LAB — GC/CHLAMYDIA PROBE AMP: GC Probe RNA: NEGATIVE

## 2012-05-01 LAB — HERPES SIMPLEX VIRUS CULTURE: Organism ID, Bacteria: NOT DETECTED

## 2012-05-06 ENCOUNTER — Encounter: Payer: Self-pay | Admitting: Obstetrics

## 2012-05-07 NOTE — Telephone Encounter (Signed)
Spoke with patient on the phone. Pt informed that all of her test results in the office came back negative. Pt informed that the bump tested negative for HSV.

## 2012-05-21 ENCOUNTER — Ambulatory Visit (INDEPENDENT_AMBULATORY_CARE_PROVIDER_SITE_OTHER): Payer: Medicaid Other | Admitting: Obstetrics

## 2012-05-21 ENCOUNTER — Other Ambulatory Visit: Payer: Self-pay | Admitting: Obstetrics

## 2012-05-21 ENCOUNTER — Encounter: Payer: Self-pay | Admitting: Obstetrics

## 2012-05-21 VITALS — BP 115/80 | HR 68 | Temp 98.0°F | Ht 66.0 in | Wt 219.0 lb

## 2012-05-21 DIAGNOSIS — N946 Dysmenorrhea, unspecified: Secondary | ICD-10-CM

## 2012-05-21 DIAGNOSIS — N760 Acute vaginitis: Secondary | ICD-10-CM | POA: Insufficient documentation

## 2012-05-21 DIAGNOSIS — R109 Unspecified abdominal pain: Secondary | ICD-10-CM

## 2012-05-21 LAB — POCT URINALYSIS DIPSTICK
Bilirubin, UA: NEGATIVE
Glucose, UA: NEGATIVE
Nitrite, UA: NEGATIVE
Urobilinogen, UA: NEGATIVE

## 2012-05-21 MED ORDER — TINIDAZOLE 500 MG PO TABS: 500.0000 mg | ORAL_TABLET | Freq: Every day | ORAL | Status: DC

## 2012-05-21 MED ORDER — IBUPROFEN 800 MG PO TABS: 800.0000 mg | ORAL_TABLET | Freq: Three times a day (TID) | ORAL | Status: DC | PRN

## 2012-05-21 MED ORDER — FLUCONAZOLE 150 MG PO TABS: 150.0000 mg | ORAL_TABLET | Freq: Once | ORAL | Status: DC

## 2012-05-21 NOTE — Patient Instructions (Signed)
BV Pelvic Pain

## 2012-05-21 NOTE — Progress Notes (Signed)
Subjective:     [name] [name] is a 22 y.o. female here for follow up on OCP.  Current complaints: constant lower abdominal pain and pressure.    Gynecologic History Patient's last menstrual period was 04/26/2012. Contraception: OCP (estrogen/progesterone)  Obstetric History OB History   Grav Para Term Preterm Abortions TAB SAB Ect Mult Living   0 0 0 0 0 0 0 0 0 0        The following portions of the patient's history were reviewed and updated as appropriate: allergies, current medications, past family history, past medical history, past social history, past surgical history and problem list.  Review of Systems Pertinent items are noted in HPI.    Objective:    General appearance: alert and no distress Abdomen: normal findings: soft, non-tender Pelvic: cervix normal in appearance, external genitalia normal, no adnexal masses or tenderness, no cervical motion tenderness and uterus normal size, shape, and consistency   Vagina with thin, gray discharge.  Assessment:    BV. Dysmenorrhea.   Plan:    Education reviewed: safe sex/STD prevention and BV. Contraception: OCP (estrogen/progesterone). Follow up in: 6 weeks. Tindamax / Diflucan Rx. U/S ordered.

## 2012-05-22 LAB — WET PREP BY MOLECULAR PROBE
Candida species: NEGATIVE
Trichomonas vaginosis: NEGATIVE

## 2012-05-24 ENCOUNTER — Ambulatory Visit (HOSPITAL_COMMUNITY)
Admission: RE | Admit: 2012-05-24 | Discharge: 2012-05-24 | Disposition: A | Discharge status: Home / Self Care | Payer: Medicaid Other | Source: Ambulatory Visit | Attending: Obstetrics | Admitting: Obstetrics

## 2012-05-24 DIAGNOSIS — N946 Dysmenorrhea, unspecified: Secondary | ICD-10-CM

## 2012-05-24 DIAGNOSIS — N949 Unspecified condition associated with female genital organs and menstrual cycle: Secondary | ICD-10-CM | POA: Insufficient documentation

## 2012-05-28 ENCOUNTER — Ambulatory Visit: Payer: Medicaid Other | Admitting: Obstetrics

## 2012-06-15 ENCOUNTER — Encounter (HOSPITAL_COMMUNITY): Payer: Self-pay | Admitting: *Deleted

## 2012-06-15 ENCOUNTER — Emergency Department (HOSPITAL_COMMUNITY): Payer: Medicaid Other

## 2012-06-15 ENCOUNTER — Emergency Department (HOSPITAL_COMMUNITY)
Admission: EM | Admit: 2012-06-15 | Discharge: 2012-06-15 | Disposition: A | Discharge status: Home / Self Care | Payer: Medicaid Other | Attending: Emergency Medicine | Admitting: Emergency Medicine

## 2012-06-15 DIAGNOSIS — F10920 Alcohol use, unspecified with intoxication, uncomplicated: Secondary | ICD-10-CM

## 2012-06-15 DIAGNOSIS — R4789 Other speech disturbances: Secondary | ICD-10-CM | POA: Insufficient documentation

## 2012-06-15 DIAGNOSIS — Z8659 Personal history of other mental and behavioral disorders: Secondary | ICD-10-CM | POA: Insufficient documentation

## 2012-06-15 DIAGNOSIS — S0590XA Unspecified injury of unspecified eye and orbit, initial encounter: Secondary | ICD-10-CM | POA: Insufficient documentation

## 2012-06-15 DIAGNOSIS — F1010 Alcohol abuse, uncomplicated: Secondary | ICD-10-CM | POA: Insufficient documentation

## 2012-06-15 DIAGNOSIS — Z79899 Other long term (current) drug therapy: Secondary | ICD-10-CM | POA: Insufficient documentation

## 2012-06-15 DIAGNOSIS — Z87891 Personal history of nicotine dependence: Secondary | ICD-10-CM | POA: Insufficient documentation

## 2012-06-15 LAB — POCT I-STAT, CHEM 8
Creatinine, Ser: 1 mg/dL (ref 0.50–1.10)
HCT: 38 % (ref 36.0–46.0)
Hemoglobin: 12.9 g/dL (ref 12.0–15.0)
Sodium: 145 mEq/L (ref 135–145)
TCO2: 27 mmol/L (ref 0–100)

## 2012-06-15 LAB — RAPID URINE DRUG SCREEN, HOSP PERFORMED
Amphetamines: NOT DETECTED
Barbiturates: NOT DETECTED
Benzodiazepines: NOT DETECTED
Tetrahydrocannabinol: POSITIVE — AB

## 2012-06-15 LAB — ETHANOL: Alcohol, Ethyl (B): 189 mg/dL — ABNORMAL HIGH (ref 0–11)
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
## 2012-06-15 NOTE — ED Notes (Signed)
Pt states that she cannot urinate.   Informed RN.
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
## 2012-06-15 NOTE — ED Notes (Signed)
Pt refusing to allow labs to be drawn or give a urine sample at this time.  Will make PA aware.

## 2012-06-15 NOTE — ED Notes (Signed)
Pt states she cannot urinate right now and would rather have in and out cath

## 2012-06-16 NOTE — ED Provider Notes (Signed)
Medical screening examination/treatment/procedure(s) were performed by non-physician practitioner and as supervising physician I was immediately available for consultation/collaboration.  Khalifa Knecht R. Yuritzi Kamp, MD 06/16/12 0805 

## 2012-08-29 ENCOUNTER — Ambulatory Visit: Payer: Medicaid Other | Admitting: Obstetrics

## 2012-11-21 ENCOUNTER — Other Ambulatory Visit: Payer: Self-pay

## 2012-12-07 ENCOUNTER — Emergency Department (HOSPITAL_COMMUNITY)
Admission: EM | Admit: 2012-12-07 | Discharge: 2012-12-07 | Disposition: A | Discharge status: Home / Self Care | Payer: Medicaid Other | Attending: Emergency Medicine | Admitting: Emergency Medicine

## 2012-12-07 ENCOUNTER — Emergency Department (HOSPITAL_COMMUNITY): Payer: Medicaid Other

## 2012-12-07 ENCOUNTER — Encounter (HOSPITAL_COMMUNITY): Payer: Self-pay | Admitting: Emergency Medicine

## 2012-12-07 DIAGNOSIS — R109 Unspecified abdominal pain: Secondary | ICD-10-CM | POA: Insufficient documentation

## 2012-12-07 DIAGNOSIS — Z3202 Encounter for pregnancy test, result negative: Secondary | ICD-10-CM | POA: Insufficient documentation

## 2012-12-07 DIAGNOSIS — F3289 Other specified depressive episodes: Secondary | ICD-10-CM | POA: Insufficient documentation

## 2012-12-07 DIAGNOSIS — R197 Diarrhea, unspecified: Secondary | ICD-10-CM | POA: Insufficient documentation

## 2012-12-07 DIAGNOSIS — R509 Fever, unspecified: Secondary | ICD-10-CM | POA: Insufficient documentation

## 2012-12-07 DIAGNOSIS — R112 Nausea with vomiting, unspecified: Secondary | ICD-10-CM | POA: Insufficient documentation

## 2012-12-07 DIAGNOSIS — F329 Major depressive disorder, single episode, unspecified: Secondary | ICD-10-CM | POA: Insufficient documentation

## 2012-12-07 DIAGNOSIS — R3989 Other symptoms and signs involving the genitourinary system: Secondary | ICD-10-CM | POA: Insufficient documentation

## 2012-12-07 DIAGNOSIS — Z87891 Personal history of nicotine dependence: Secondary | ICD-10-CM | POA: Insufficient documentation

## 2012-12-07 DIAGNOSIS — Z79899 Other long term (current) drug therapy: Secondary | ICD-10-CM | POA: Insufficient documentation

## 2012-12-07 LAB — COMPREHENSIVE METABOLIC PANEL
Alkaline Phosphatase: 65 U/L (ref 39–117)
BUN: 10 mg/dL (ref 6–23)
CO2: 23 mEq/L (ref 19–32)
Chloride: 99 mEq/L (ref 96–112)
Creatinine, Ser: 0.65 mg/dL (ref 0.50–1.10)
GFR calc Af Amer: >90 mL/min (ref >90)
GFR calc non Af Amer: >90 mL/min (ref >90)
Glucose, Bld: 87 mg/dL (ref 70–99)
Total Bilirubin: 0.4 mg/dL (ref 0.3–1.2)

## 2012-12-07 LAB — CBC WITH DIFFERENTIAL/PLATELET
Basophils Relative: 0 % (ref 0–1)
Eosinophils Relative: 1 % (ref 0–5)
HCT: 36 % (ref 36.0–46.0)
Hemoglobin: 11.7 g/dL — ABNORMAL LOW (ref 12.0–15.0)
Lymphocytes Relative: 14 % (ref 12–46)
Lymphs Abs: 0.9 10*3/uL (ref 0.7–4.0)
MCH: 28 pg (ref 26.0–34.0)
MCHC: 32.5 g/dL (ref 30.0–36.0)
MCV: 86.1 fL (ref 78.0–100.0)
Monocytes Absolute: 0.4 10*3/uL (ref 0.1–1.0)
Monocytes Relative: 6 % (ref 3–12)
Neutro Abs: 5.4 10*3/uL (ref 1.7–7.7)
RBC: 4.18 MIL/uL (ref 3.87–5.11)

## 2012-12-07 LAB — URINALYSIS, ROUTINE W REFLEX MICROSCOPIC
Bilirubin Urine: NEGATIVE
Glucose, UA: NEGATIVE mg/dL
Ketones, ur: NEGATIVE mg/dL
Leukocytes, UA: NEGATIVE
Specific Gravity, Urine: 1.017 (ref 1.005–1.030)
pH: 6.5 (ref 5.0–8.0)

## 2012-12-07 LAB — LIPASE, BLOOD: Lipase: 20 U/L (ref 11–59)

## 2012-12-07 LAB — POCT PREGNANCY, URINE: Preg Test, Ur: NEGATIVE

## 2012-12-07 MED ORDER — TRAMADOL HCL 50 MG PO TABS: 50.0000 mg | ORAL_TABLET | Freq: Four times a day (QID) | ORAL | Status: DC | PRN

## 2012-12-07 MED ORDER — SODIUM CHLORIDE 0.9 % IV BOLUS (SEPSIS)
1000.0000 mL | Freq: Once | INTRAVENOUS | Status: AC
  Administered 2012-12-07: 1000 mL via INTRAVENOUS

## 2012-12-07 MED ORDER — ONDANSETRON HCL 4 MG/2ML IJ SOLN
4.0000 mg | Freq: Once | INTRAMUSCULAR | Status: AC
  Administered 2012-12-07: 4 mg via INTRAVENOUS
  Filled 2012-12-07: qty 2

## 2012-12-07 MED ORDER — PROMETHAZINE HCL 25 MG PO TABS: 25.0000 mg | ORAL_TABLET | Freq: Four times a day (QID) | ORAL | Status: DC | PRN

## 2012-12-07 MED ORDER — DICYCLOMINE HCL 20 MG PO TABS: 20.0000 mg | ORAL_TABLET | Freq: Two times a day (BID) | ORAL | Status: DC

## 2012-12-07 MED ORDER — MORPHINE SULFATE 4 MG/ML IJ SOLN
4.0000 mg | Freq: Once | INTRAMUSCULAR | Status: AC
  Administered 2012-12-07: 4 mg via INTRAVENOUS
  Filled 2012-12-07: qty 1

## 2012-12-07 NOTE — ED Notes (Signed)
Patient in via EMS with co N/V/D and abdominal pain.

## 2012-12-07 NOTE — ED Provider Notes (Signed)
CSN: [id number]     Arrival date & time 12/07/12  1030 History   First MD Initiated Contact with Patient 12/07/12 1032     Chief Complaint  Patient presents with  . Nausea  . Emesis  . Abdominal Pain   (Consider location/radiation/quality/duration/timing/severity/associated sxs/prior Treatment) HPI  [name] [name] is a 22 y.o. female complaining of 3 days of nonbloody, nonbilious, non-coffee ground emesis, watery diarrhea, 8/10 abdominal pain associated with fever (Tmax 1013 days ago). Patient denies any sick contacts, melena, hematochezia, prior abdominal surgeries. Reports reduced urinary output.  Past Medical History  Diagnosis Date  . Depression   . Suicidal overdose   . Bipolar 1 disorder    Past Surgical History  Procedure Laterality Date  . No past surgeries     Family History  Problem Relation Age of Onset  . Heart attack Mother   . Hypertension Father   . Stomach cancer Mother    History  Substance Use Topics  . Smoking status: Former Smoker -- 0.50 packs/day for 1 years    Types: Cigarettes  . Smokeless tobacco: Never Used  . Alcohol Use: 0.6 oz/week    1 Cans of beer per week     Comment: vodka 2-3 times weekly   OB History   Grav Para Term Preterm Abortions TAB SAB Ect Mult Living   0 0 0 0 0 0 0 0 0 0      Review of Systems 10 systems reviewed and found to be negative, except as noted in the HPI   Allergies  Review of patient's allergies indicates no known allergies.  Home Medications   Current Outpatient Rx  Name  Route  Sig  Dispense  Refill  . albuterol (PROVENTIL HFA;VENTOLIN HFA) 108 (90 BASE) MCG/ACT inhaler   Inhalation   Inhale 2 puffs into the lungs every 6 (six) hours as needed. For wheezing/shortness of breath         . norelgestromin-ethinyl estradiol (ORTHO EVRA) 150-35 MCG/24HR transdermal patch   Transdermal   Place 1 patch onto the skin once a week.         . dicyclomine (BENTYL) 20 MG tablet   Oral   Take 1 tablet (20  mg total) by mouth 2 (two) times daily.   20 tablet   0   . promethazine (PHENERGAN) 25 MG tablet   Oral   Take 1 tablet (25 mg total) by mouth every 6 (six) hours as needed for nausea or vomiting.   12 tablet   0   . traMADol (ULTRAM) 50 MG tablet   Oral   Take 1 tablet (50 mg total) by mouth every 6 (six) hours as needed.   15 tablet   0    BP 117/59  Pulse 99  Temp(Src) 99.2 F (37.3 C) (Oral)  Resp 20  SpO2 100%  LMP 11/23/2012 Physical Exam  Nursing note and vitals reviewed. Constitutional: She is oriented to person, place, and time. She appears well-developed and well-nourished. No distress.  HENT:  Head: Normocephalic.  Eyes: Conjunctivae and EOM are normal.  Neck: Normal range of motion.  Cardiovascular: Normal rate.   Pulmonary/Chest: Effort normal and breath sounds normal. No stridor. No respiratory distress. She has no wheezes. She has no rales. She exhibits no tenderness.  Abdominal: Soft. Bowel sounds are normal. There is tenderness.  Positive Murphy sign, positive epigastric pain. No guarding or rebound.  Musculoskeletal: Normal range of motion.  Neurological: She is alert and oriented to person, place,  and time.  Psychiatric: She has a normal mood and affect.    ED Course  Procedures (including critical care time) Labs Review Labs Reviewed  CBC WITH DIFFERENTIAL - Abnormal; Notable for the following:    Hemoglobin 11.7 (*)    Neutrophils Relative % 79 (*)    All other components within normal limits  COMPREHENSIVE METABOLIC PANEL - Abnormal; Notable for the following:    Sodium 133 (*)    All other components within normal limits  URINALYSIS, ROUTINE W REFLEX MICROSCOPIC  LIPASE, BLOOD  POCT PREGNANCY, URINE   Imaging Review US Abdomen Complete  12/07/2012   CLINICAL DATA:  Nausea, vomiting, abdominal pain  EXAM: ULTRASOUND ABDOMEN COMPLETE  COMPARISON:  None.  FINDINGS: Gallbladder  No gallstones or wall thickening visualized. No sonographic  Murphy sign noted.  Common bile duct  Diameter: Normal caliber, 4 mm.  Liver  No focal lesion identified. Within normal limits in parenchymal echogenicity.  IVC  No abnormality visualized.  Pancreas  Visualized portion unremarkable.  Spleen  Size and appearance within normal limits.  Right Kidney  Length: 11.0 cm. Echogenicity within normal limits. No mass or hydronephrosis visualized.  Left Kidney  Length: 10.9 cm. Echogenicity within normal limits. No mass or hydronephrosis visualized.  Abdominal aorta  No aneurysm visualized.  IMPRESSION: Unremarkable abdominal ultrasound.   Electronically Signed   By: Charlett Nose M.D.   On: 12/07/2012 13:01    EKG Interpretation   None       MDM   1. Nausea vomiting and diarrhea   2. Abdominal pain      Filed Vitals:   12/07/12 1035 12/07/12 1134 12/07/12 1406  BP: 125/75 110/56 117/59  Pulse: 99 90 99  Temp: 99 F (37.2 C) 99.2 F (37.3 C)   TempSrc: Oral Oral   Resp: 16 18 20   SpO2: 99% 100% 100%     [name] [name] is a 22 y.o. female nausea vomiting diarrhea and abdominal pain for 3 days. Patient has positive Murphy sign, basic blood work and ultrasound ordered.  Ultrasound shows no abnormalities, blood work is grossly within normal limits. Serial abdominal exam remains benign. Doubt any acute process that would require intervention at this time. We have discussed return precautions. Patient verbalized her understanding. Patient was sent home with pain control and medication for nausea.  Medications  sodium chloride 0.9 % bolus 1,000 mL (0 mLs Intravenous Stopped 12/07/12 1209)  morphine 4 MG/ML injection 4 mg (4 mg Intravenous Given 12/07/12 1227)  ondansetron (ZOFRAN) injection 4 mg (4 mg Intravenous Given 12/07/12 1226)    Pt is hemodynamically stable, appropriate for, and amenable to discharge at this time. Pt verbalized understanding and agrees with care plan. All questions answered. Outpatient follow-up and specific return  precautions discussed.    New Prescriptions   DICYCLOMINE (BENTYL) 20 MG TABLET    Take 1 tablet (20 mg total) by mouth 2 (two) times daily.   PROMETHAZINE (PHENERGAN) 25 MG TABLET    Take 1 tablet (25 mg total) by mouth every 6 (six) hours as needed for nausea or vomiting.   TRAMADOL (ULTRAM) 50 MG TABLET    Take 1 tablet (50 mg total) by mouth every 6 (six) hours as needed.    Note: Portions of this report may have been transcribed using voice recognition software. Every effort was made to ensure accuracy; however, inadvertent computerized transcription errors may be present      Wynetta Emery, PA-C 12/07/12 1638

## 2012-12-07 NOTE — ED Notes (Signed)
US at bedside

## 2012-12-07 NOTE — ED Provider Notes (Signed)
Medical screening examination/treatment/procedure(s) were performed by non-physician practitioner and as supervising physician I was immediately available for consultation/collaboration.  EKG Interpretation   None        Tyion Boylen F Cashmere Dingley, MD 12/07/12 1816 

## 2012-12-07 NOTE — ED Notes (Signed)
Pt up to BR w/o assistance

## 2012-12-07 NOTE — ED Notes (Signed)
Pt states she is unable to give urine sample at this time. 

## 2012-12-07 NOTE — ED Notes (Signed)
Patient will call when she can urinate 

## 2012-12-07 NOTE — ED Notes (Signed)
Bed: WA05 Expected date:  Expected time:  Means of arrival:  Comments: EMS N/V 

## 2013-01-10 ENCOUNTER — Emergency Department (HOSPITAL_COMMUNITY)
Admission: EM | Admit: 2013-01-10 | Discharge: 2013-01-11 | Disposition: A | Discharge status: Home / Self Care | Payer: Medicaid Other | Attending: Emergency Medicine | Admitting: Emergency Medicine

## 2013-01-10 DIAGNOSIS — R1110 Vomiting, unspecified: Secondary | ICD-10-CM

## 2013-01-10 DIAGNOSIS — Z87891 Personal history of nicotine dependence: Secondary | ICD-10-CM | POA: Insufficient documentation

## 2013-01-10 DIAGNOSIS — R109 Unspecified abdominal pain: Secondary | ICD-10-CM | POA: Insufficient documentation

## 2013-01-10 DIAGNOSIS — Z79899 Other long term (current) drug therapy: Secondary | ICD-10-CM | POA: Insufficient documentation

## 2013-01-10 DIAGNOSIS — Z8659 Personal history of other mental and behavioral disorders: Secondary | ICD-10-CM | POA: Insufficient documentation

## 2013-01-10 DIAGNOSIS — R112 Nausea with vomiting, unspecified: Secondary | ICD-10-CM | POA: Insufficient documentation

## 2013-01-11 ENCOUNTER — Encounter (HOSPITAL_COMMUNITY): Payer: Self-pay | Admitting: Emergency Medicine

## 2013-01-11 MED ORDER — PROMETHAZINE HCL 25 MG/ML IJ SOLN
25.0000 mg | Freq: Once | INTRAMUSCULAR | Status: AC
  Administered 2013-01-11: 25 mg via INTRAMUSCULAR
  Filled 2013-01-11: qty 1

## 2013-01-11 MED ORDER — ONDANSETRON 8 MG PO TBDP
8.0000 mg | ORAL_TABLET | Freq: Once | ORAL | Status: AC
  Administered 2013-01-11: 8 mg via ORAL
  Filled 2013-01-11: qty 1

## 2013-01-11 MED ORDER — PROMETHAZINE HCL 25 MG PO TABS: 25.0000 mg | ORAL_TABLET | Freq: Four times a day (QID) | ORAL | Status: DC | PRN

## 2013-01-11 NOTE — ED Provider Notes (Signed)
Medical screening examination/treatment/procedure(s) were performed by non-physician practitioner and as supervising physician I was immediately available for consultation/collaboration.   Selma Rodelo, MD 01/11/13 0749 

## 2013-01-11 NOTE — ED Notes (Signed)
Pt reports developing emesis last night and later today developed abdominal pain "after throwing up so many times."  Pt denies diarrhea. Pt is A/O x4 and vital signs are WDL.

## 2013-01-11 NOTE — ED Provider Notes (Signed)
CSN: [id number]     Arrival date & time 01/10/13  2241 History   First MD Initiated Contact with Patient 01/11/13 0014     Chief Complaint  Patient presents with  . Emesis  . Abdominal Pain   (Consider location/radiation/quality/duration/timing/severity/associated sxs/prior Treatment) HPI Comments: Patient is otherwise healthy 22 year old female who presents to the ED with complaints of nausea, vomiting x 6 with dry heaves now all day.  She reports abdominal cramping just before vomiting, and "soreness" because of so many episodes of vomiting.  She denies fever, chills, vaginal discharge, bleeding, states she is not pregnant "because I am gay".  She also denies dysuria, hematuria, recent sick contacts.  Patient is a 22 y.o. female presenting with vomiting and abdominal pain. The history is provided by the patient. No language interpreter was used.  Emesis Severity:  Moderate Duration:  12 hours Timing:  Constant Number of daily episodes:  6 Quality:  Undigested food and stomach contents How soon after eating does vomiting occur:  12 hours Progression:  Worsening Chronicity:  New Recent urination:  Normal Relieved by:  Nothing Worsened by:  Nothing tried Ineffective treatments:  None tried Associated symptoms: abdominal pain   Associated symptoms: no chills, no cough, no diarrhea, no fever, no headaches, no myalgias, no sore throat and no URI   Abdominal Pain Associated symptoms: vomiting   Associated symptoms: no chills, no diarrhea and no sore throat     Past Medical History  Diagnosis Date  . Depression   . Suicidal overdose   . Bipolar 1 disorder    Past Surgical History  Procedure Laterality Date  . No past surgeries     Family History  Problem Relation Age of Onset  . Heart attack Mother   . Hypertension Father   . Stomach cancer Mother    History  Substance Use Topics  . Smoking status: Former Smoker -- 0.50 packs/day for 1 years    Types: Cigarettes  .  Smokeless tobacco: Never Used  . Alcohol Use: 0.6 oz/week    1 Cans of beer per week     Comment: vodka 2-3 times weekly   OB History   Grav Para Term Preterm Abortions TAB SAB Ect Mult Living   0 0 0 0 0 0 0 0 0 0      Review of Systems  Constitutional: Negative for chills.  HENT: Negative for sore throat.   Gastrointestinal: Positive for vomiting and abdominal pain. Negative for diarrhea.  Musculoskeletal: Negative for myalgias.  Neurological: Negative for headaches.  All other systems reviewed and are negative.    Allergies  Review of patient's allergies indicates no known allergies.  Home Medications   Current Outpatient Rx  Name  Route  Sig  Dispense  Refill  . albuterol (PROVENTIL HFA;VENTOLIN HFA) 108 (90 BASE) MCG/ACT inhaler   Inhalation   Inhale 2 puffs into the lungs every 6 (six) hours as needed. For wheezing/shortness of breath          BP 122/69  Pulse 63  Temp(Src) 98.3 F (36.8 C) (Oral)  Resp 16  SpO2 100%  LMP 12/28/2012 Physical Exam  Nursing note and vitals reviewed. Constitutional: She is oriented to person, place, and time. She appears well-developed and well-nourished. No distress.  HENT:  Head: Normocephalic and atraumatic.  Right Ear: External ear normal.  Left Ear: External ear normal.  Nose: Nose normal.  Mouth/Throat: Oropharynx is clear and moist. No oropharyngeal exudate.  Eyes: Conjunctivae are  normal. Pupils are equal, round, and reactive to light. No scleral icterus.  Neck: Normal range of motion. Neck supple.  Cardiovascular: Normal rate, regular rhythm and normal heart sounds.  Exam reveals no gallop and no friction rub.   No murmur heard. Pulmonary/Chest: Effort normal and breath sounds normal. No respiratory distress. She has no wheezes.  Abdominal: Soft. Bowel sounds are normal. She exhibits no distension. There is no tenderness. There is no rebound and no guarding.  Musculoskeletal: Normal range of motion. She exhibits no  edema and no tenderness.  Lymphadenopathy:    She has no cervical adenopathy.  Neurological: She is alert and oriented to person, place, and time. She exhibits normal muscle tone. Coordination normal.  Skin: Skin is warm and dry. No rash noted. No erythema. No pallor.  Psychiatric: She has a normal mood and affect. Her behavior is normal. Judgment and thought content normal.    ED Course  Procedures (including critical care time) Labs Review Labs Reviewed - No data to display Imaging Review No results found.  EKG Interpretation   None       MDM  Vomiting  Clinically very well appearing 22 year old female, no signs of dehydration, still with nausea after zofran 4mg  ODT in triage, states feels better after phenergan 25 mg IM.  Able to tolerate po fluids, no abdominal pain on examination so I do not suspect an acute emergency abdominal condition.  Patient to be discharged home.    Izola Price Marisue Humble, [place] 01/11/13 0203

## 2013-01-11 NOTE — ED Notes (Signed)
Fluids given.

## 2013-07-27 ENCOUNTER — Emergency Department (HOSPITAL_COMMUNITY)
Admission: EM | Admit: 2013-07-27 | Discharge: 2013-07-27 | Disposition: A | Discharge status: Home / Self Care | Payer: Medicaid Other | Attending: Emergency Medicine | Admitting: Emergency Medicine

## 2013-07-27 ENCOUNTER — Encounter (HOSPITAL_COMMUNITY): Payer: Self-pay | Admitting: Emergency Medicine

## 2013-07-27 ENCOUNTER — Emergency Department (HOSPITAL_COMMUNITY): Payer: Medicaid Other

## 2013-07-27 DIAGNOSIS — R509 Fever, unspecified: Secondary | ICD-10-CM | POA: Insufficient documentation

## 2013-07-27 DIAGNOSIS — H9209 Otalgia, unspecified ear: Secondary | ICD-10-CM | POA: Diagnosis not present

## 2013-07-27 DIAGNOSIS — R1110 Vomiting, unspecified: Secondary | ICD-10-CM | POA: Insufficient documentation

## 2013-07-27 DIAGNOSIS — J069 Acute upper respiratory infection, unspecified: Secondary | ICD-10-CM | POA: Diagnosis not present

## 2013-07-27 DIAGNOSIS — J029 Acute pharyngitis, unspecified: Secondary | ICD-10-CM | POA: Insufficient documentation

## 2013-07-27 DIAGNOSIS — Z87891 Personal history of nicotine dependence: Secondary | ICD-10-CM | POA: Diagnosis not present

## 2013-07-27 DIAGNOSIS — R05 Cough: Secondary | ICD-10-CM | POA: Diagnosis present

## 2013-07-27 DIAGNOSIS — Z8659 Personal history of other mental and behavioral disorders: Secondary | ICD-10-CM | POA: Insufficient documentation

## 2013-07-27 DIAGNOSIS — R059 Cough, unspecified: Secondary | ICD-10-CM | POA: Diagnosis present

## 2013-07-27 LAB — RAPID STREP SCREEN (MED CTR MEBANE ONLY): STREPTOCOCCUS, GROUP A SCREEN (DIRECT): NEGATIVE

## 2013-07-27 MED ORDER — ACETAMINOPHEN 325 MG PO TABS
650.0000 mg | ORAL_TABLET | Freq: Once | ORAL | Status: AC
  Administered 2013-07-27: 650 mg via ORAL
  Filled 2013-07-27: qty 2

## 2013-07-27 MED ORDER — AZITHROMYCIN 250 MG PO TABS: ORAL_TABLET | ORAL | Status: DC

## 2013-07-27 NOTE — ED Provider Notes (Signed)
Complains of nonproductive cough, sore throat with pain on swallowing and one episode of posttussive vomiting onset this morning. No treatment prior to coming here. On exam no distress. HEENT exam oral pharynx minimally reddened. No tonsillar exudate. Uvula midline. Daily Citrucel neck supple. Trachea midline. No adenopathy. Lungs clear auscultation  Doug SouSam Yuritzy Zehring, MD 07/27/13 1622

## 2013-07-27 NOTE — ED Notes (Signed)
Pt sts woke up this afternoon feeling sick, with sore throat and cough, sts generalized body aches and nausea, pt sts went to club last night without any symptoms. Pt also reports sick nephew at home "but he has different symptoms".

## 2013-07-27 NOTE — Discharge Instructions (Signed)
°Upper Respiratory Infection, Adult °An upper respiratory infection (URI) is also sometimes known as the common cold. The upper respiratory tract includes the nose, sinuses, throat, trachea, and bronchi. Bronchi are the airways leading to the lungs. Most people improve within 1 week, but symptoms can last up to 2 weeks. A residual cough may last even longer.  °CAUSES °Many different viruses can infect the tissues lining the upper respiratory tract. The tissues become irritated and inflamed and often become very moist. Mucus production is also common. A cold is contagious. You can easily spread the virus to others by oral contact. This includes kissing, sharing a glass, coughing, or sneezing. Touching your mouth or nose and then touching a surface, which is then touched by another person, can also spread the virus. °SYMPTOMS  °Symptoms typically develop 1 to 3 days after you come in contact with a cold virus. Symptoms vary from person to person. They may include: °· Runny nose. °· Sneezing. °· Nasal congestion. °· Sinus irritation. °· Sore throat. °· Loss of voice (laryngitis). °· Cough. °· Fatigue. °· Muscle aches. °· Loss of appetite. °· Headache. °· Low-grade fever. °DIAGNOSIS  °You might diagnose your own cold based on familiar symptoms, since most people get a cold 2 to 3 times a year. Your caregiver can confirm this based on your exam. Most importantly, your caregiver can check that your symptoms are not due to another disease such as strep throat, sinusitis, pneumonia, asthma, or epiglottitis. Blood tests, throat tests, and X-rays are not necessary to diagnose a common cold, but they may sometimes be helpful in excluding other more serious diseases. Your caregiver will decide if any further tests are required. °RISKS AND COMPLICATIONS  °You may be at risk for a more severe case of the common cold if you smoke cigarettes, have chronic heart disease (such as heart failure) or lung disease (such as asthma), or  if you have a weakened immune system. The very young and very old are also at risk for more serious infections. Bacterial sinusitis, middle ear infections, and bacterial pneumonia can complicate the common cold. The common cold can worsen asthma and chronic obstructive pulmonary disease (COPD). Sometimes, these complications can require emergency medical care and may be life-threatening. °PREVENTION  °The best way to protect against getting a cold is to practice good hygiene. Avoid oral or hand contact with people with cold symptoms. Wash your hands often if contact occurs. There is no clear evidence that vitamin C, vitamin E, echinacea, or exercise reduces the chance of developing a cold. However, it is always recommended to get plenty of rest and practice good nutrition. °TREATMENT  °Treatment is directed at relieving symptoms. There is no cure. Antibiotics are not effective, because the infection is caused by a virus, not by bacteria. Treatment may include: °· Increased fluid intake. Sports drinks offer valuable electrolytes, sugars, and fluids. °· Breathing heated mist or steam (vaporizer or shower). °· Eating chicken soup or other clear broths, and maintaining good nutrition. °· Getting plenty of rest. °· Using gargles or lozenges for comfort. °· Controlling fevers with ibuprofen or acetaminophen as directed by your caregiver. °· Increasing usage of your inhaler if you have asthma. °Zinc gel and zinc lozenges, taken in the first 24 hours of the common cold, can shorten the duration and lessen the severity of symptoms. Pain medicines may help with fever, muscle aches, and throat pain. A variety of non-prescription medicines are available to treat congestion and runny nose. Your caregiver   can make recommendations and may suggest nasal or lung inhalers for other symptoms.  HOME CARE INSTRUCTIONS   Only take over-the-counter or prescription medicines for pain, discomfort, or fever as directed by your  caregiver.  Use a warm mist humidifier or inhale steam from a shower to increase air moisture. This may keep secretions moist and make it easier to breathe.  Drink enough water and fluids to keep your urine clear or pale yellow.  Rest as needed.  Return to work when your temperature has returned to normal or as your caregiver advises. You may need to stay home longer to avoid infecting others. You can also use a face mask and careful hand washing to prevent spread of the virus. SEEK MEDICAL CARE IF:   After the first few days, you feel you are getting worse rather than better.  You need your caregiver's advice about medicines to control symptoms.  You develop chills, worsening shortness of breath, or brown or red sputum. These may be signs of pneumonia.  You develop yellow or brown nasal discharge or pain in the face, especially when you bend forward. These may be signs of sinusitis.  You develop a fever, swollen neck glands, pain with swallowing, or white areas in the back of your throat. These may be signs of strep throat. SEEK IMMEDIATE MEDICAL CARE IF:   You have a fever.  You develop severe or persistent headache, ear pain, sinus pain, or chest pain.  You develop wheezing, a prolonged cough, cough up blood, or have a change in your usual mucus (if you have chronic lung disease).  You develop sore muscles or a stiff neck. Document Released: 06/28/2000 Document Revised: 03/27/2011 Document Reviewed: 05/06/2010 [facility] Patient Information 2015 [employer], [place]. This information is not intended to replace advice given to you by your health care provider. Make sure you discuss any questions you have with your health care provider. Azithromycin tablets What is this medicine? AZITHROMYCIN (az ith roe MYE sin) is a macrolide antibiotic. It is used to treat or prevent certain kinds of bacterial infections. It will not work for colds, flu, or other viral infections. This medicine may be  used for other purposes; ask your health care provider or pharmacist if you have questions. COMMON BRAND NAME(S): Zithromax, Zithromax Tri-Pak, Zithromax Z-Pak What should I tell my health care provider before I take this medicine? They need to know if you have any of these conditions: -kidney disease -liver disease -irregular heartbeat or heart disease -an unusual or allergic reaction to azithromycin, erythromycin, other macrolide antibiotics, foods, dyes, or preservatives -pregnant or trying to get pregnant -breast-feeding How should I use this medicine? Take this medicine by mouth with a full glass of water. Follow the directions on the prescription label. The tablets can be taken with food or on an empty stomach. If the medicine upsets your stomach, take it with food. Take your medicine at regular intervals. Do not take your medicine more often than directed. Take all of your medicine as directed even if you think your are better. Do not skip doses or stop your medicine early. Talk to your pediatrician regarding the use of this medicine in children. Special care may be needed. Overdosage: If you think you have taken too much of this medicine contact a poison control center or emergency room at once. NOTE: This medicine is only for you. Do not share this medicine with others. What if I miss a dose? If you miss a dose, take it  as soon as you can. If it is almost time for your next dose, take only that dose. Do not take double or extra doses. What may interact with this medicine? Do not take this medicine with any of the following medications: -lincomycin This medicine may also interact with the following medications: -amiodarone -antacids -birth control pills -cyclosporine -digoxin -magnesium -nelfinavir -phenytoin -warfarin This list may not describe all possible interactions. Give your health care provider a list of all the medicines, herbs, non-prescription drugs, or dietary  supplements you use. Also tell them if you smoke, drink alcohol, or use illegal drugs. Some items may interact with your medicine. What should I watch for while using this medicine? Tell your doctor or health care professional if your symptoms do not improve. Do not treat diarrhea with over the counter products. Contact your doctor if you have diarrhea that lasts more than 2 days or if it is severe and watery. This medicine can make you more sensitive to the sun. Keep out of the sun. If you cannot avoid being in the sun, wear protective clothing and use sunscreen. Do not use sun lamps or tanning beds/booths. What side effects may I notice from receiving this medicine? Side effects that you should report to your doctor or health care professional as soon as possible: -allergic reactions like skin rash, itching or hives, swelling of the face, lips, or tongue -confusion, nightmares or hallucinations -dark urine -difficulty breathing -hearing loss -irregular heartbeat or chest pain -pain or difficulty passing urine -redness, blistering, peeling or loosening of the skin, including inside the mouth -white patches or sores in the mouth -yellowing of the eyes or skin Side effects that usually do not require medical attention (report to your doctor or health care professional if they continue or are bothersome): -diarrhea -dizziness, drowsiness -headache -stomach upset or vomiting -tooth discoloration -vaginal irritation This list may not describe all possible side effects. Call your doctor for medical advice about side effects. You may report side effects to FDA at 1-800-FDA-1088. Where should I keep my medicine? Keep out of the reach of children. Store at room temperature between 15 and 30 degrees C (59 and 86 degrees F). Throw away any unused medicine after the expiration date. NOTE: This sheet is a summary. It may not cover all possible information. If you have questions about this medicine,  talk to your doctor, pharmacist, or health care provider.  2015, [employer]. (2012-08-08 15:38:48) .

## 2013-07-27 NOTE — ED Notes (Signed)
PA-C at bedside 

## 2013-07-27 NOTE — ED Provider Notes (Signed)
CSN: [id number]     Arrival date & time 07/27/13  1408 History   First MD Initiated Contact with Patient 07/27/13 1514     Chief Complaint  Patient presents with  . multiple complaints      (Consider location/radiation/quality/duration/timing/severity/associated sxs/prior Treatment) Patient is a 23 y.o. female presenting with URI. The history is provided by the patient and medical records. No language interpreter was used.  URI Presenting symptoms: congestion, cough, ear pain, fatigue, fever and sore throat   Cough:    Cough characteristics:  Non-productive   Severity:  Moderate   Onset quality:  Sudden Severity:  Moderate Onset quality:  Sudden Duration: began this morning. Timing:  Constant Progression:  Worsening Chronicity:  Recurrent (similar sxs last week) Relieved by:  None tried Exacerbated by: coughing swallowing. Associated symptoms: arthralgias and myalgias   Associated symptoms: no headaches   Associated symptoms comment:  Chest pain with cough. Wheezing which resolved PTA Risk factors: no sick contacts   Risk factors comment:  Smoker   Past Medical History  Diagnosis Date  . Depression   . Suicidal overdose   . Bipolar 1 disorder    Past Surgical History  Procedure Laterality Date  . No past surgeries     Family History  Problem Relation Age of Onset  . Heart attack Mother   . Hypertension Father   . Stomach cancer Mother    History  Substance Use Topics  . Smoking status: Former Smoker -- 0.50 packs/day for 1 years    Types: Cigarettes  . Smokeless tobacco: Never Used  . Alcohol Use: 0.6 oz/week    1 Cans of beer per week     Comment: vodka 2-3 times weekly   OB History   Grav Para Term Preterm Abortions TAB SAB Ect Mult Living   0 0 0 0 0 0 0 0 0 0      Review of Systems  Constitutional: Positive for fever, chills, appetite change and fatigue.  HENT: Positive for congestion, ear pain and sore throat.   Respiratory: Positive for cough.    Gastrointestinal: Positive for vomiting (Post tussive (2 episodes)).  Genitourinary: Negative for dysuria.  Musculoskeletal: Positive for arthralgias and myalgias.  Neurological: Positive for weakness. Negative for headaches.  Hematological: Negative for adenopathy.      Allergies  Review of patient's allergies indicates no known allergies.  Home Medications   Prior to Admission medications   Medication Sig Start Date End Date Taking? Authorizing Provider  albuterol (PROVENTIL HFA;VENTOLIN HFA) 108 (90 BASE) MCG/ACT inhaler Inhale 2 puffs into the lungs every 6 (six) hours as needed. For wheezing/shortness of breath   Yes Historical Provider, MD   BP 125/71  Pulse 105  Temp(Src) 102.2 F (39 C) (Oral)  Resp 16  SpO2 100% Physical Exam Appears moderately ill but not toxic; temperature as noted in vitals. Ears normal. Eyes:glassy appearance, no discharge  Heart: RRR, NO M/G/R Throat and pharynx erythematous with mild swelling of the tonsils. No exudates.  Neck supple. No adenopathyhy in the neck.  Sinuses non tender.  The chest is clear. Abdomen is soft and non tender. Normal BS.  ED Course  Procedures (including critical care time) Labs Review Labs Reviewed  RAPID STREP SCREEN    Imaging Review Dg Chest 2 View  07/27/2013   CLINICAL DATA:  Cough and weakness.  EXAM: CHEST  2 VIEW  COMPARISON:  None.  FINDINGS: Heart size and mediastinal contours are within normal limits. Both lungs are  clear. Visualized skeletal structures are unremarkable.  IMPRESSION: Negative exam.   Electronically Signed   By: Drusilla Kannerhomas  Dalessio M.D.   On: 07/27/2013 15:41     EKG Interpretation None      MDM   Final diagnoses:  URI (upper respiratory infection)    4:00 PM BP 125/71  Pulse 105  Temp(Src) 102.2 F (39 C) (Oral)  Resp 16  SpO2 100% Patient febrile with sudden onset URI sxs here in the ED. Patient denies uriary sxs. She denies possibility of pregnancy as she is  WSW. Last LMP  6 mos ago (normal for her to have irregular periods). She was given tylenol . Negative CXR, no wheezing or abnormal lung sounds. rapid strep pending.  Patient rapid strep is negative. As the patient reports, she has a recurrence of sxs from last week. i will discharge her with azithromycin. She may use otc meds for sxs relief. Her vital signs are improving and her temperature has decreased.  Patient / Family / Caregiver informed of clinical course, understand medical decision-making process, and agree with plan.      Arthor Captainbigail Surie Suchocki, PA-C 07/29/13 [phone]

## 2013-07-27 NOTE — ED Notes (Signed)
Per EMS pt coming from home with c/o feeling sick: sore throat, cough, runny nose, lower back pain. Pt was ambulatory on arrival.

## 2013-07-27 NOTE — ED Notes (Signed)
Bed: WA03 Expected date:  Expected time:  Means of arrival:  Comments: EMS flu-like sx

## 2013-07-29 LAB — CULTURE, GROUP A STREP

## 2013-07-30 NOTE — ED Provider Notes (Signed)
Medical screening examination/treatment/procedure(s) were conducted as a shared visit with non-physician practitioner(s) and myself.  I personally evaluated the patient during the encounter.   EKG Interpretation None       Doug SouSam Hanifah Royse, MD 07/30/13 [phone]

## 2013-12-26 ENCOUNTER — Encounter (HOSPITAL_COMMUNITY): Payer: Self-pay | Admitting: *Deleted

## 2013-12-26 ENCOUNTER — Inpatient Hospital Stay (HOSPITAL_COMMUNITY)
Admission: AD | Admit: 2013-12-26 | Discharge: 2013-12-26 | Disposition: A | Discharge status: Home / Self Care | Payer: Medicaid Other | Source: Ambulatory Visit | Attending: Obstetrics and Gynecology | Admitting: Obstetrics and Gynecology

## 2013-12-26 ENCOUNTER — Inpatient Hospital Stay (HOSPITAL_COMMUNITY): Payer: Medicaid Other

## 2013-12-26 DIAGNOSIS — Z87891 Personal history of nicotine dependence: Secondary | ICD-10-CM | POA: Diagnosis not present

## 2013-12-26 DIAGNOSIS — O26851 Spotting complicating pregnancy, first trimester: Secondary | ICD-10-CM

## 2013-12-26 DIAGNOSIS — N949 Unspecified condition associated with female genital organs and menstrual cycle: Secondary | ICD-10-CM | POA: Insufficient documentation

## 2013-12-26 DIAGNOSIS — Z3A00 Weeks of gestation of pregnancy not specified: Secondary | ICD-10-CM | POA: Insufficient documentation

## 2013-12-26 DIAGNOSIS — R109 Unspecified abdominal pain: Secondary | ICD-10-CM | POA: Diagnosis present

## 2013-12-26 DIAGNOSIS — R102 Pelvic and perineal pain: Secondary | ICD-10-CM

## 2013-12-26 DIAGNOSIS — F319 Bipolar disorder, unspecified: Secondary | ICD-10-CM | POA: Insufficient documentation

## 2013-12-26 DIAGNOSIS — O26891 Other specified pregnancy related conditions, first trimester: Secondary | ICD-10-CM

## 2013-12-26 LAB — URINALYSIS, ROUTINE W REFLEX MICROSCOPIC
Bilirubin Urine: NEGATIVE
Glucose, UA: NEGATIVE mg/dL
KETONES UR: NEGATIVE mg/dL
NITRITE: NEGATIVE
Protein, ur: NEGATIVE mg/dL
SPECIFIC GRAVITY, URINE: 1.02 (ref 1.005–1.030)
Urobilinogen, UA: 1 mg/dL (ref 0.0–1.0)
pH: 6.5 (ref 5.0–8.0)

## 2013-12-26 LAB — URINE MICROSCOPIC-ADD ON

## 2013-12-26 LAB — WET PREP, GENITAL
Clue Cells Wet Prep HPF POC: NONE SEEN
TRICH WET PREP: NONE SEEN
YEAST WET PREP: NONE SEEN

## 2013-12-26 LAB — CBC
HCT: 35.1 % — ABNORMAL LOW (ref 36.0–46.0)
Hemoglobin: 11.5 g/dL — ABNORMAL LOW (ref 12.0–15.0)
MCH: 29.3 pg (ref 26.0–34.0)
MCHC: 32.8 g/dL (ref 30.0–36.0)
MCV: 89.5 fL (ref 78.0–100.0)
Platelets: 276 10*3/uL (ref 150–400)
RBC: 3.92 MIL/uL (ref 3.87–5.11)
RDW: 13.1 % (ref 11.5–15.5)
WBC: 8.1 10*3/uL (ref 4.0–10.5)

## 2013-12-26 LAB — ABO/RH: ABO/RH(D): A POS

## 2013-12-26 LAB — HIV ANTIBODY (ROUTINE TESTING W REFLEX): HIV 1&2 Ab, 4th Generation: NONREACTIVE

## 2013-12-26 LAB — POCT PREGNANCY, URINE: Preg Test, Ur: POSITIVE — AB

## 2013-12-26 LAB — HCG, QUANTITATIVE, PREGNANCY: hCG, Beta Chain, Quant, S: 20287 m[IU]/mL — ABNORMAL HIGH (ref <5)

## 2013-12-26 NOTE — Discharge Instructions (Signed)
First Trimester of Pregnancy The first trimester of pregnancy is from week 1 until the end of week 12 (months 1 through 3). A week after a sperm fertilizes an egg, the egg will implant on the wall of the uterus. This embryo will begin to develop into a baby. Genes from you and your partner are forming the baby. The female genes determine whether the baby is a boy or a girl. At 6-8 weeks, the eyes and face are formed, and the heartbeat can be seen on ultrasound. At the end of 12 weeks, all the baby's organs are formed.  Now that you are pregnant, you will want to do everything you can to have a healthy baby. Two of the most important things are to get good prenatal care and to follow your health care provider's instructions. Prenatal care is all the medical care you receive before the baby's birth. This care will help prevent, find, and treat any problems during the pregnancy and childbirth. BODY CHANGES Your body goes through many changes during pregnancy. The changes vary from woman to woman.   You may gain or lose a couple of pounds at first.  You may feel sick to your stomach (nauseous) and throw up (vomit). If the vomiting is uncontrollable, call your health care provider.  You may tire easily.  You may develop headaches that can be relieved by medicines approved by your health care provider.  You may urinate more often. Painful urination may mean you have a bladder infection.  You may develop heartburn as a result of your pregnancy.  You may develop constipation because certain hormones are causing the muscles that push waste through your intestines to slow down.  You may develop hemorrhoids or swollen, bulging veins (varicose veins).  Your breasts may begin to grow larger and become tender. Your nipples may stick out more, and the tissue that surrounds them (areola) may become darker.  Your gums may bleed and may be sensitive to brushing and flossing.  Dark spots or blotches (chloasma,  mask of pregnancy) may develop on your face. This will likely fade after the baby is born.  Your menstrual periods will stop.  You may have a loss of appetite.  You may develop cravings for certain kinds of food.  You may have changes in your emotions from day to day, such as being excited to be pregnant or being concerned that something may go wrong with the pregnancy and baby.  You may have more vivid and strange dreams.  You may have changes in your hair. These can include thickening of your hair, rapid growth, and changes in texture. Some women also have hair loss during or after pregnancy, or hair that feels dry or thin. Your hair will most likely return to normal after your baby is born. WHAT TO EXPECT AT YOUR PRENATAL VISITS During a routine prenatal visit:  You will be weighed to make sure you and the baby are growing normally.  Your blood pressure will be taken.  Your abdomen will be measured to track your baby's growth.  The fetal heartbeat will be listened to starting around week 10 or 12 of your pregnancy.  Test results from any previous visits will be discussed. Your health care provider may ask you:  How you are feeling.  If you are feeling the baby move.  If you have had any abnormal symptoms, such as leaking fluid, bleeding, severe headaches, or abdominal cramping.  If you have any questions. Other tests   that may be performed during your first trimester include:  Blood tests to find your blood type and to check for the presence of any previous infections. They will also be used to check for low iron levels (anemia) and Rh antibodies. Later in the pregnancy, blood tests for diabetes will be done along with other tests if problems develop.  Urine tests to check for infections, diabetes, or protein in the urine.  An ultrasound to confirm the proper growth and development of the baby.  An amniocentesis to check for possible genetic problems.  Fetal screens for  spina bifida and Down syndrome.  You may need other tests to make sure you and the baby are doing well. HOME CARE INSTRUCTIONS  Medicines  Follow your health care provider's instructions regarding medicine use. Specific medicines may be either safe or unsafe to take during pregnancy.  Take your prenatal vitamins as directed.  If you develop constipation, try taking a stool softener if your health care provider approves. Diet  Eat regular, well-balanced meals. Choose a variety of foods, such as meat or vegetable-based protein, fish, milk and low-fat dairy products, vegetables, fruits, and whole grain breads and cereals. Your health care provider will help you determine the amount of weight gain that is right for you.  Avoid raw meat and uncooked cheese. These carry germs that can cause birth defects in the baby.  Eating four or five small meals rather than three large meals a day may help relieve nausea and vomiting. If you start to feel nauseous, eating a few soda crackers can be helpful. Drinking liquids between meals instead of during meals also seems to help nausea and vomiting.  If you develop constipation, eat more high-fiber foods, such as fresh vegetables or fruit and whole grains. Drink enough fluids to keep your urine clear or pale yellow. Activity and Exercise  Exercise only as directed by your health care provider. Exercising will help you:  Control your weight.  Stay in shape.  Be prepared for labor and delivery.  Experiencing pain or cramping in the lower abdomen or low back is a good sign that you should stop exercising. Check with your health care provider before continuing normal exercises.  Try to avoid standing for long periods of time. Move your legs often if you must stand in one place for a long time.  Avoid heavy lifting.  Wear low-heeled shoes, and practice good posture.  You may continue to have sex unless your health care provider directs you  otherwise. Relief of Pain or Discomfort  Wear a good support bra for breast tenderness.   Take warm sitz baths to soothe any pain or discomfort caused by hemorrhoids. Use hemorrhoid cream if your health care provider approves.   Rest with your legs elevated if you have leg cramps or low back pain.  If you develop varicose veins in your legs, wear support hose. Elevate your feet for 15 minutes, 3-4 times a day. Limit salt in your diet. Prenatal Care  Schedule your prenatal visits by the twelfth week of pregnancy. They are usually scheduled monthly at first, then more often in the last 2 months before delivery.  Write down your questions. Take them to your prenatal visits.  Keep all your prenatal visits as directed by your health care provider. Safety  Wear your seat belt at all times when driving.  Make a list of emergency phone numbers, including numbers for family, friends, the hospital, and police and fire departments. General Tips    Ask your health care provider for a referral to a local prenatal education class. Begin classes no later than at the beginning of month 6 of your pregnancy.  Ask for help if you have counseling or nutritional needs during pregnancy. Your health care provider can offer advice or refer you to specialists for help with various needs.  Do not use hot tubs, steam rooms, or saunas.  Do not douche or use tampons or scented sanitary pads.  Do not cross your legs for long periods of time.  Avoid cat litter boxes and soil used by cats. These carry germs that can cause birth defects in the baby and possibly loss of the fetus by miscarriage or stillbirth.  Avoid all smoking, herbs, alcohol, and medicines not prescribed by your health care provider. Chemicals in these affect the formation and growth of the baby.  Schedule a dentist appointment. At home, brush your teeth with a soft toothbrush and be gentle when you floss. SEEK MEDICAL CARE IF:   You have  dizziness.  You have mild pelvic cramps, pelvic pressure, or nagging pain in the abdominal area.  You have persistent nausea, vomiting, or diarrhea.  You have a bad smelling vaginal discharge.  You have pain with urination.  You notice increased swelling in your face, hands, legs, or ankles. SEEK IMMEDIATE MEDICAL CARE IF:   You have a fever.  You are leaking fluid from your vagina.  You have spotting or bleeding from your vagina.  You have severe abdominal cramping or pain.  You have rapid weight gain or loss.  You vomit blood or material that looks like coffee grounds.  You are exposed to German measles and have never had them.  You are exposed to fifth disease or chickenpox.  You develop a severe headache.  You have shortness of breath.  You have any kind of trauma, such as from a fall or a car accident. Document Released: 12/27/2000 Document Revised: 05/19/2013 Document Reviewed: 11/12/2012 [employer] Patient Information 2015 [employer]. This information is not intended to replace advice given to you by your health care provider. Make sure you discuss any questions you have with your health care provider.  

## 2013-12-26 NOTE — MAU Provider Note (Signed)
History     CSN: [id number]  Arrival date and time: 12/26/13 1233   First Provider Initiated Contact with Patient 12/26/13 1617      Chief Complaint  Patient presents with  . Vaginal Bleeding  . Positive upt    HPI  [name] [name] is a 23 y.o. G1P0000 at Unknown who presents today with spotting and cramping. She states that she has had the cramping for about a month. She states that she started spotting yesterday and took a pregnancy test. She is unsure of her LMP. She states that she has only had two periods this year, and that her periods have always been very irregular. She denies any vaginal discharge.   Past Medical History  Diagnosis Date  . Depression   . Suicidal overdose   . Bipolar 1 disorder     Past Surgical History  Procedure Laterality Date  . No past surgeries      Family History  Problem Relation Age of Onset  . Heart attack Mother   . Hypertension Father   . Stomach cancer Mother     History  Substance Use Topics  . Smoking status: Former Smoker -- 0.50 packs/day for 1 years    Types: Cigarettes  . Smokeless tobacco: Never Used  . Alcohol Use: 0.6 oz/week    1 Cans of beer per week     Comment: vodka 2-3 times weekly    Allergies: No Known Allergies  Prescriptions prior to admission  Medication Sig Dispense Refill Last Dose  . albuterol (PROVENTIL HFA;VENTOLIN HFA) 108 (90 BASE) MCG/ACT inhaler Inhale 2 puffs into the lungs every 6 (six) hours as needed. For wheezing/shortness of breath   Past Month at Unknown time  . azithromycin (ZITHROMAX Z-PAK) 250 MG tablet 2 po day one, then 1 daily x 4 days 5 tablet 0     ROS Physical Exam   Blood pressure 117/76, pulse 77, temperature 98.6 F (37 C), temperature source Oral, resp. rate 16, height 5\' 6"  (1.676 m), weight 95.936 kg (211 lb 8 oz).  Physical Exam  Nursing note and vitals reviewed. Constitutional: She is oriented to person, place, and time. She appears well-developed and  well-nourished. No distress.  Cardiovascular: Normal rate.   Respiratory: Effort normal.  GI: Soft. There is no tenderness. There is no rebound.  Genitourinary:   External: no lesion Vagina: small amount of white discharge blood seen Cervix: pink, smooth, no CMT Uterus: NSSC Adnexa: NT   Neurological: She is alert and oriented to person, place, and time.  Skin: Skin is warm and dry.  Psychiatric: She has a normal mood and affect.    MAU Course  Procedures   Results for orders placed or performed during the hospital encounter of 12/26/13 (from the past 24 hour(s))  Urinalysis, Routine w reflex microscopic     Status: Abnormal   Collection Time: 12/26/13  1:59 PM  Result Value Ref Range   Color, Urine YELLOW YELLOW   APPearance CLEAR CLEAR   Specific Gravity, Urine 1.020 1.005 - 1.030   pH 6.5 5.0 - 8.0   Glucose, UA NEGATIVE NEGATIVE mg/dL   Hgb urine dipstick LARGE (A) NEGATIVE   Bilirubin Urine NEGATIVE NEGATIVE   Ketones, ur NEGATIVE NEGATIVE mg/dL   Protein, ur NEGATIVE NEGATIVE mg/dL   Urobilinogen, UA 1.0 0.0 - 1.0 mg/dL   Nitrite NEGATIVE NEGATIVE   Leukocytes, UA SMALL (A) NEGATIVE  Urine microscopic-add on     Status: Abnormal   Collection Time:  12/26/13  1:59 PM  Result Value Ref Range   Squamous Epithelial / LPF FEW (A) RARE   WBC, UA 7-10 <3 WBC/hpf   RBC / HPF 3-6 <3 RBC/hpf   Bacteria, UA FEW (A) RARE   Urine-Other MUCOUS PRESENT   Pregnancy, urine POC     Status: Abnormal   Collection Time: 12/26/13  2:11 PM  Result Value Ref Range   Preg Test, Ur POSITIVE (A) NEGATIVE  CBC     Status: Abnormal   Collection Time: 12/26/13  4:05 PM  Result Value Ref Range   WBC 8.1 4.0 - 10.5 K/uL   RBC 3.92 3.87 - 5.11 MIL/uL   Hemoglobin 11.5 (L) 12.0 - 15.0 g/dL   HCT 40.935.1 (L) 81.136.0 - 91.446.0 %   MCV 89.5 78.0 - 100.0 fL   MCH 29.3 26.0 - 34.0 pg   MCHC 32.8 30.0 - 36.0 g/dL   RDW 78.213.1 95.611.5 - 21.315.5 %   Platelets 276 150 - 400 K/uL  hCG, quantitative, pregnancy      Status: Abnormal   Collection Time: 12/26/13  4:05 PM  Result Value Ref Range   hCG, Beta Chain, Quant, S 20287 (H) <5 mIU/mL  Wet prep, genital     Status: Abnormal   Collection Time: 12/26/13  4:25 PM  Result Value Ref Range   Yeast Wet Prep HPF POC NONE SEEN NONE SEEN   Trich, Wet Prep NONE SEEN NONE SEEN   Clue Cells Wet Prep HPF POC NONE SEEN NONE SEEN   WBC, Wet Prep HPF POC FEW (A) NONE SEEN   [place] Ob Comp Less 14 Wks  12/26/2013   CLINICAL DATA:  Pregnant, cramping, vaginal bleeding/passing clots  EXAM: OBSTETRIC <14 WK US AND TRANSVAGINAL OB US  TECHNIQUE: Both transabdominal and transvaginal ultrasound examinations were performed for complete evaluation of the gestation as well as the maternal uterus, adnexal regions, and pelvic cul-de-sac. Transvaginal technique was performed to assess early pregnancy.  COMPARISON:  None.  FINDINGS: Intrauterine gestational sac: Visualized/normal in shape.  Yolk sac:  Present  Embryo:  Present  Cardiac Activity: Present  Heart Rate:  126 bpm  CRL:   8.6  mm   6 w 6 d                  US EDC: 08/15/2014  Maternal uterus/adnexae: Moderate subchronic hemorrhage.  Right ovary is within normal limits, measuring 3.0 x 1.7 x 1.5 cm.  Left ovary is within normal limits, measuring 3.0 x 2.1 x 1.8 cm.  Trace pelvic fluid.  IMPRESSION: Single live intrauterine gestation with estimated gestational age [redacted] weeks 6 days.   Electronically Signed   By: Charline BillsSriyesh  Krishnan M.D.   On: 12/26/2013 17:10   [place] Ob Transvaginal  12/26/2013   CLINICAL DATA:  Pregnant, cramping, vaginal bleeding/passing clots  EXAM: OBSTETRIC <14 WK US AND TRANSVAGINAL OB US  TECHNIQUE: Both transabdominal and transvaginal ultrasound examinations were performed for complete evaluation of the gestation as well as the maternal uterus, adnexal regions, and pelvic cul-de-sac. Transvaginal technique was performed to assess early pregnancy.  COMPARISON:  None.  FINDINGS: Intrauterine gestational sac:  Visualized/normal in shape.  Yolk sac:  Present  Embryo:  Present  Cardiac Activity: Present  Heart Rate:  126 bpm  CRL:   8.6  mm   6 w 6 d                  US EDC: 08/15/2014  Maternal uterus/adnexae:  Moderate subchronic hemorrhage.  Right ovary is within normal limits, measuring 3.0 x 1.7 x 1.5 cm.  Left ovary is within normal limits, measuring 3.0 x 2.1 x 1.8 cm.  Trace pelvic fluid.  IMPRESSION: Single live intrauterine gestation with estimated gestational age [redacted] weeks 6 days.   Electronically Signed   By: Charline Bills M.D.   On: 12/26/2013 17:10     Assessment and Plan   1. Spotting affecting pregnancy in first trimester   2. Pelvic pain affecting pregnancy in first trimester, antepartum    First trimester precautions Return to MAU as needed Start [facility] as soon as possible Pregnancy verification letter given  Follow-up Information    Follow up with [facility] [place].   Contact information:   [address] [facility] [address] [phone]       Tawnya Crook 12/26/2013, 4:23 PM

## 2013-12-26 NOTE — MAU Note (Signed)
Pt states here for positive upt at home, and cramping, has passed small clots as well. Cramping began one month ago. Hx irregular cycles, unknown LMP.

## 2013-12-27 LAB — GC/CHLAMYDIA PROBE AMP
CT Probe RNA: NEGATIVE
GC Probe RNA: POSITIVE — AB

## 2014-01-16 NOTE — L&D Delivery Note (Signed)
Delivery Note At 9:58 AM a viable female was delivered via Vaginal, Spontaneous Delivery (Presentation: LOA;  ).  APGAR: 8, 9; weight  pending   Placenta status: Intact, Spontaneous.  Cord: 3 vessels with the following complications: None.    Anesthesia: Epidural  Episiotomy: None Lacerations: 2nd degree;Perineal Suture Repair: 2.0 monofilament Est. Blood Loss (mL): 345  Mom to postpartum.  Baby to Couplet care / Skin to Skin.  Lowanda Foster 08/07/2014, 10:33 AM   I was gloved and present for entire delivery Agree with note There was no difficulty with shoulders FHR remained reassuring throughout Shallow 2nd degree laceration repaired with monocryl in usual fashion Aviva Signs, CNM

## 2014-03-04 ENCOUNTER — Other Ambulatory Visit (HOSPITAL_COMMUNITY)
Admission: RE | Admit: 2014-03-04 | Discharge: 2014-03-04 | Disposition: A | Discharge status: Home / Self Care | Payer: Medicaid Other | Source: Ambulatory Visit | Attending: Advanced Practice Midwife | Admitting: Advanced Practice Midwife

## 2014-03-04 ENCOUNTER — Encounter: Payer: Self-pay | Admitting: Advanced Practice Midwife

## 2014-03-04 ENCOUNTER — Ambulatory Visit (INDEPENDENT_AMBULATORY_CARE_PROVIDER_SITE_OTHER): Payer: Medicaid Other | Admitting: Obstetrics & Gynecology

## 2014-03-04 VITALS — BP 120/83 | HR 79 | Wt 214.5 lb

## 2014-03-04 DIAGNOSIS — Z113 Encounter for screening for infections with a predominantly sexual mode of transmission: Secondary | ICD-10-CM | POA: Insufficient documentation

## 2014-03-04 DIAGNOSIS — Z01419 Encounter for gynecological examination (general) (routine) without abnormal findings: Secondary | ICD-10-CM | POA: Insufficient documentation

## 2014-03-04 DIAGNOSIS — O219 Vomiting of pregnancy, unspecified: Secondary | ICD-10-CM

## 2014-03-04 DIAGNOSIS — Z124 Encounter for screening for malignant neoplasm of cervix: Secondary | ICD-10-CM

## 2014-03-04 DIAGNOSIS — F32A Depression, unspecified: Secondary | ICD-10-CM

## 2014-03-04 DIAGNOSIS — F329 Major depressive disorder, single episode, unspecified: Secondary | ICD-10-CM

## 2014-03-04 DIAGNOSIS — Z3490 Encounter for supervision of normal pregnancy, unspecified, unspecified trimester: Secondary | ICD-10-CM

## 2014-03-04 DIAGNOSIS — Z118 Encounter for screening for other infectious and parasitic diseases: Secondary | ICD-10-CM

## 2014-03-04 DIAGNOSIS — Z3492 Encounter for supervision of normal pregnancy, unspecified, second trimester: Secondary | ICD-10-CM

## 2014-03-04 LAB — POCT URINALYSIS DIP (DEVICE)
Bilirubin Urine: NEGATIVE
Glucose, UA: NEGATIVE mg/dL
Hgb urine dipstick: NEGATIVE
Ketones, ur: NEGATIVE mg/dL
Nitrite: NEGATIVE
PROTEIN: NEGATIVE mg/dL
Specific Gravity, Urine: 1.02 (ref 1.005–1.030)
UROBILINOGEN UA: 0.2 mg/dL (ref 0.0–1.0)
pH: 7 (ref 5.0–8.0)

## 2014-03-04 MED ORDER — DOXYLAMINE-PYRIDOXINE 10-10 MG PO TBEC: 2.0000 | DELAYED_RELEASE_TABLET | Freq: Every day | ORAL | Status: DC

## 2014-03-04 MED ORDER — PRENATAL VITAMINS 0.8 MG PO TABS: 1.0000 | ORAL_TABLET | Freq: Every day | ORAL | Status: DC

## 2014-03-04 MED ORDER — PROMETHAZINE HCL 25 MG PO TABS: 25.0000 mg | ORAL_TABLET | Freq: Four times a day (QID) | ORAL | Status: DC | PRN

## 2014-03-04 NOTE — Patient Instructions (Signed)
Second Trimester of Pregnancy The second trimester is from week 13 through week 28, months 4 through 6. The second trimester is often a time when you feel your best. Your body has also adjusted to being pregnant, and you begin to feel better physically. Usually, morning sickness has lessened or quit completely, you may have more energy, and you may have an increase in appetite. The second trimester is also a time when the fetus is growing rapidly. At the end of the sixth month, the fetus is about 9 inches long and weighs about 1 pounds. You will likely begin to feel the baby move (quickening) between 18 and 20 weeks of the pregnancy. BODY CHANGES Your body goes through many changes during pregnancy. The changes vary from woman to woman.   Your weight will continue to increase. You will notice your lower abdomen bulging out.  You may begin to get stretch marks on your hips, abdomen, and breasts.  You may develop headaches that can be relieved by medicines approved by your health care provider.  You may urinate more often because the fetus is pressing on your bladder.  You may develop or continue to have heartburn as a result of your pregnancy.  You may develop constipation because certain hormones are causing the muscles that push waste through your intestines to slow down.  You may develop hemorrhoids or swollen, bulging veins (varicose veins).  You may have back pain because of the weight gain and pregnancy hormones relaxing your joints between the bones in your pelvis and as a result of a shift in weight and the muscles that support your balance.  Your breasts will continue to grow and be tender.  Your gums may bleed and may be sensitive to brushing and flossing.  Dark spots or blotches (chloasma, mask of pregnancy) may develop on your face. This will likely fade after the baby is born.  A dark line from your belly button to the pubic area (linea nigra) may appear. This will likely fade  after the baby is born.  You may have changes in your hair. These can include thickening of your hair, rapid growth, and changes in texture. Some women also have hair loss during or after pregnancy, or hair that feels dry or thin. Your hair will most likely return to normal after your baby is born. WHAT TO EXPECT AT YOUR PRENATAL VISITS During a routine prenatal visit:  You will be weighed to make sure you and the fetus are growing normally.  Your blood pressure will be taken.  Your abdomen will be measured to track your baby's growth.  The fetal heartbeat will be listened to.  Any test results from the previous visit will be discussed. Your health care provider may ask you:  How you are feeling.  If you are feeling the baby move.  If you have had any abnormal symptoms, such as leaking fluid, bleeding, severe headaches, or abdominal cramping.  If you have any questions. Other tests that may be performed during your second trimester include:  Blood tests that check for:  Low iron levels (anemia).  Gestational diabetes (between 24 and 28 weeks).  Rh antibodies.  Urine tests to check for infections, diabetes, or protein in the urine.  An ultrasound to confirm the proper growth and development of the baby.  An amniocentesis to check for possible genetic problems.  Fetal screens for spina bifida and Down syndrome. HOME CARE INSTRUCTIONS   Avoid all smoking, herbs, alcohol, and unprescribed   drugs. These chemicals affect the formation and growth of the baby.  Follow your health care provider's instructions regarding medicine use. There are medicines that are either safe or unsafe to take during pregnancy.  Exercise only as directed by your health care provider. Experiencing uterine cramps is a good sign to stop exercising.  Continue to eat regular, healthy meals.  Wear a good support bra for breast tenderness.  Do not use hot tubs, steam rooms, or saunas.  Wear your  seat belt at all times when driving.  Avoid raw meat, uncooked cheese, cat litter boxes, and soil used by cats. These carry germs that can cause birth defects in the baby.  Take your prenatal vitamins.  Try taking a stool softener (if your health care provider approves) if you develop constipation. Eat more high-fiber foods, such as fresh vegetables or fruit and whole grains. Drink plenty of fluids to keep your urine clear or pale yellow.  Take warm sitz baths to soothe any pain or discomfort caused by hemorrhoids. Use hemorrhoid cream if your health care provider approves.  If you develop varicose veins, wear support hose. Elevate your feet for 15 minutes, 3-4 times a day. Limit salt in your diet.  Avoid heavy lifting, wear low heel shoes, and practice good posture.  Rest with your legs elevated if you have leg cramps or low back pain.  Visit your dentist if you have not gone yet during your pregnancy. Use a soft toothbrush to brush your teeth and be gentle when you floss.  A sexual relationship may be continued unless your health care provider directs you otherwise.  Continue to go to all your prenatal visits as directed by your health care provider. SEEK MEDICAL CARE IF:   You have dizziness.  You have mild pelvic cramps, pelvic pressure, or nagging pain in the abdominal area.  You have persistent nausea, vomiting, or diarrhea.  You have a bad smelling vaginal discharge.  You have pain with urination. SEEK IMMEDIATE MEDICAL CARE IF:   You have a fever.  You are leaking fluid from your vagina.  You have spotting or bleeding from your vagina.  You have severe abdominal cramping or pain.  You have rapid weight gain or loss.  You have shortness of breath with chest pain.  You notice sudden or extreme swelling of your face, hands, ankles, feet, or legs.  You have not felt your baby move in over an hour.  You have severe headaches that do not go away with  medicine.  You have vision changes. Document Released: 12/27/2000 Document Revised: 01/07/2013 Document Reviewed: 03/05/2012 Exit[employer] Patient Information 2015 [employer]. This information is not intended to replace advice given to you by your health care provider. Make sure you discuss any questions you have with your health care provider.  

## 2014-03-04 NOTE — Progress Notes (Signed)
    Subjective:    [name] CoombesJustina [name] is a 24 y.o. at G1P0000 at [id number]  Dated by 7 week scan being seen today for her first obstetrical visit.  Her obstetrical history is significant for history of depression and suicide attempt in the past; denies any current depression symptoms. She is not on any medications, not seeing any provider; "I am no longer depressed".  Reports that pregnancy make her happy and does not usually exacerbate her symptoms.  Pregnancy history fully reviewed.  Patient reports nausea, vomiting and insomnia.  Desires medication. Also desires PNV.  Filed Vitals:   03/04/14 1008  BP: 120/83  Pulse: 79  Weight: 214 lb 8 oz (97.297 kg)    HISTORY: OB History  Gravida Para Term Preterm AB SAB TAB Ectopic Multiple Living  1 0 0 0 0 0 0 0 0 0     # Outcome Date GA Lbr Len/2nd Weight Sex Delivery Anes PTL Lv  1 Current              Past Medical History  Diagnosis Date  . Depression   . Suicidal overdose   . Bipolar 1 disorder    Past Surgical History  Procedure Laterality Date  . No past surgeries     Family History  Problem Relation Age of Onset  . Heart attack Mother   . Hypertension Father   . Stomach cancer Mother      Exam    Uterus:   16 week size  Pelvic Exam:    Perineum: No Hemorrhoids, Normal Perineum   Vulva: normal   Vagina:  normal mucosa, normal discharge   Cervix: multiparous appearance, no bleeding following Pap and no cervical motion tenderness   Adnexa: normal adnexa and no mass, fullness, tenderness   Bony Pelvis: average  System: Breast:  normal appearance, no masses or tenderness, Inspection negative   Skin: normal coloration and turgor, no rashes   Neurologic: oriented, normal   Extremities: normal strength, tone, and muscle mass   HEENT PERRLA and extra ocular movement intact   Mouth/Teeth mucous membranes moist, pharynx normal without lesions and dental hygiene good   Neck supple and no masses   Cardiovascular: regular rate  and rhythm   Respiratory:  appears well, vitals normal, no respiratory distress, acyanotic, normal RR, chest clear, no wheezing, crepitations, rhonchi, normal symmetric air entry   Abdomen: soft, non-tender; bowel sounds normal; no masses,  no organomegaly   Urinary: urethral meatus normal      Assessment:    Pregnancy: G1P0000 Patient Active Problem List   Diagnosis Date Noted  . Supervision of normal pregnancy 03/04/2014  . Dysmenorrhea 05/21/2012  . Vaginitis and vulvovaginitis, unspecified 05/21/2012  . Metrorrhagia 04/29/2012  . Ulceration of vulva, unspecified 04/29/2012  . Depression 04/29/2012     Plan:   Initial labs including 1 hr GTT drawn today; will follow up results and manage accordingly. Prenatal vitamins prescribed.  Also prescribed Diclegis for nausea.  If this does not work, will try Phenergan.  Will monitor for depression symptoms; no intervention needed for now. Problem list reviewed and updated. Genetic Screening discussed Quad Screen: ordered. Ultrasound discussed; fetal survey: ordered. The nature [facility] with multiple MDs and other Advanced Practitioners was explained to patient; also emphasized that residents, students are part of our team. Follow up in 4 weeks.  Routine obstetric precautions reviewed.  Tereso NewcomerANYANWU,UGONNA A, MD 03/04/2014

## 2014-03-04 NOTE — Progress Notes (Signed)
Pt reports that she gets headaches when she gets angry. Has been taking iburprofen.

## 2014-03-05 LAB — PRENATAL PROFILE (SOLSTAS)
ANTIBODY SCREEN: NEGATIVE
BASOS PCT: 0 % (ref 0–1)
Basophils Absolute: 0 10*3/uL (ref 0.0–0.1)
EOS PCT: 1 % (ref 0–5)
Eosinophils Absolute: 0.1 10*3/uL (ref 0.0–0.7)
HCT: 37 % (ref 36.0–46.0)
HEMOGLOBIN: 12.2 g/dL (ref 12.0–15.0)
HIV 1&2 Ab, 4th Generation: NONREACTIVE
Hepatitis B Surface Ag: NEGATIVE
LYMPHS ABS: 1.5 10*3/uL (ref 0.7–4.0)
Lymphocytes Relative: 19 % (ref 12–46)
MCH: 28.8 pg (ref 26.0–34.0)
MCHC: 33 g/dL (ref 30.0–36.0)
MCV: 87.3 fL (ref 78.0–100.0)
MPV: 9.6 fL (ref 8.6–12.4)
Monocytes Absolute: 0.6 10*3/uL (ref 0.1–1.0)
Monocytes Relative: 7 % (ref 3–12)
NEUTROS PCT: 73 % (ref 43–77)
Neutro Abs: 5.9 10*3/uL (ref 1.7–7.7)
Platelets: 247 10*3/uL (ref 150–400)
RBC: 4.24 MIL/uL (ref 3.87–5.11)
RDW: 13.8 % (ref 11.5–15.5)
RH TYPE: POSITIVE
Rubella: 2.75 Index — ABNORMAL HIGH (ref <0.90)
WBC: 8.1 10*3/uL (ref 4.0–10.5)

## 2014-03-05 LAB — CYTOLOGY - PAP

## 2014-03-05 LAB — GLUCOSE TOLERANCE, 1 HOUR (50G) W/O FASTING: Glucose, 1 Hour GTT: 74 mg/dL (ref 70–140)

## 2014-03-06 LAB — PRESCRIPTION MONITORING PROFILE (19 PANEL)
Amphetamine/Meth: NEGATIVE ng/mL
BENZODIAZEPINE SCREEN, URINE: NEGATIVE ng/mL
BUPRENORPHINE, URINE: NEGATIVE ng/mL
Barbiturate Screen, Urine: NEGATIVE ng/mL
CANNABINOID SCRN UR: NEGATIVE ng/mL
Carisoprodol, Urine: NEGATIVE ng/mL
Cocaine Metabolites: NEGATIVE ng/mL
Creatinine, Urine: 121.17 mg/dL (ref >20.0)
Fentanyl, Ur: NEGATIVE ng/mL
MDMA URINE: NEGATIVE ng/mL
METHADONE SCREEN, URINE: NEGATIVE ng/mL
METHAQUALONE SCREEN (URINE): NEGATIVE ng/mL
Meperidine, Ur: NEGATIVE ng/mL
NITRITES URINE, INITIAL: NEGATIVE ug/mL
Opiate Screen, Urine: NEGATIVE ng/mL
Oxycodone Screen, Ur: NEGATIVE ng/mL
PHENCYCLIDINE, UR: NEGATIVE ng/mL
PROPOXYPHENE: NEGATIVE ng/mL
Tapentadol, urine: NEGATIVE ng/mL
Tramadol Scrn, Ur: NEGATIVE ng/mL
ZOLPIDEM, URINE: NEGATIVE ng/mL
pH, Initial: 7 pH (ref 4.5–8.9)

## 2014-03-06 LAB — HEMOGLOBINOPATHY EVALUATION
Hemoglobin Other: 0 %
Hgb A2 Quant: 2.9 % (ref 2.2–3.2)
Hgb A: 96.6 % — ABNORMAL LOW (ref 96.8–97.8)
Hgb F Quant: 0.5 % (ref 0.0–2.0)
Hgb S Quant: 0 %

## 2014-03-08 ENCOUNTER — Other Ambulatory Visit: Payer: Self-pay | Admitting: Advanced Practice Midwife

## 2014-03-08 DIAGNOSIS — O2342 Unspecified infection of urinary tract in pregnancy, second trimester: Secondary | ICD-10-CM | POA: Insufficient documentation

## 2014-03-08 LAB — CULTURE, OB URINE: Colony Count: >=100000

## 2014-03-08 MED ORDER — NITROFURANTOIN MONOHYD MACRO 100 MG PO CAPS: 100.0000 mg | ORAL_CAPSULE | Freq: Two times a day (BID) | ORAL | Status: DC

## 2014-03-08 NOTE — Progress Notes (Signed)
UTI. Rx Macrobid.  

## 2014-03-09 ENCOUNTER — Encounter: Payer: Self-pay | Admitting: *Deleted

## 2014-03-09 LAB — AFP, QUAD SCREEN
AFP: 32.3 ng/mL
Curr Gest Age: 16.4 wks.days
Down Syndrome Scr Risk Est: 1:7160 {titer}
HCG, Total: 27.26 IU/mL
INH: 224.4 pg/mL
Interpretation-AFP: NEGATIVE
MOM FOR AFP: 0.99
MOM FOR HCG: 0.9
MoM for INH: 1.54
Open Spina bifida: NEGATIVE
Osb Risk: 1:27300 {titer}
TRI 18 SCR RISK EST: NEGATIVE
UE3 VALUE: 1.02 ng/mL
uE3 Mom: 1.17

## 2014-03-09 NOTE — Progress Notes (Signed)
Mychart message sent to patient.

## 2014-03-10 ENCOUNTER — Encounter: Payer: Self-pay | Admitting: *Deleted

## 2014-03-19 ENCOUNTER — Encounter: Payer: Self-pay | Admitting: Family Medicine

## 2014-03-26 ENCOUNTER — Ambulatory Visit (HOSPITAL_COMMUNITY)
Admission: RE | Admit: 2014-03-26 | Discharge: 2014-03-26 | Disposition: A | Discharge status: Home / Self Care | Payer: Medicaid Other | Source: Ambulatory Visit | Attending: Obstetrics & Gynecology | Admitting: Obstetrics & Gynecology

## 2014-03-26 DIAGNOSIS — Z36 Encounter for antenatal screening of mother: Secondary | ICD-10-CM | POA: Insufficient documentation

## 2014-03-26 DIAGNOSIS — Z3A19 19 weeks gestation of pregnancy: Secondary | ICD-10-CM | POA: Insufficient documentation

## 2014-03-26 DIAGNOSIS — Z3492 Encounter for supervision of normal pregnancy, unspecified, second trimester: Secondary | ICD-10-CM

## 2014-03-26 DIAGNOSIS — Z3689 Encounter for other specified antenatal screening: Secondary | ICD-10-CM | POA: Insufficient documentation

## 2014-04-01 ENCOUNTER — Ambulatory Visit (INDEPENDENT_AMBULATORY_CARE_PROVIDER_SITE_OTHER): Payer: Medicaid Other | Admitting: Certified Nurse Midwife

## 2014-04-01 VITALS — BP 113/78 | HR 96 | Wt 219.4 lb

## 2014-04-01 DIAGNOSIS — Z3492 Encounter for supervision of normal pregnancy, unspecified, second trimester: Secondary | ICD-10-CM

## 2014-04-01 DIAGNOSIS — Z3482 Encounter for supervision of other normal pregnancy, second trimester: Secondary | ICD-10-CM

## 2014-04-01 LAB — POCT URINALYSIS DIP (DEVICE)
Bilirubin Urine: NEGATIVE
Glucose, UA: NEGATIVE mg/dL
Hgb urine dipstick: NEGATIVE
Ketones, ur: NEGATIVE mg/dL
Nitrite: NEGATIVE
Protein, ur: NEGATIVE mg/dL
Specific Gravity, Urine: 1.02 (ref 1.005–1.030)
Urobilinogen, UA: 1 mg/dL (ref 0.0–1.0)
pH: 7 (ref 5.0–8.0)

## 2014-04-01 NOTE — Patient Instructions (Signed)
Braxton Hicks Contractions °Contractions of the uterus can occur throughout pregnancy. Contractions are not always a sign that you are in labor.  °WHAT ARE BRAXTON HICKS CONTRACTIONS?  °Contractions that occur before labor are called Braxton Hicks contractions, or false labor. Toward the end of pregnancy (32-34 weeks), these contractions can develop more often and may become more forceful. This is not true labor because these contractions do not result in opening (dilatation) and thinning of the cervix. They are sometimes difficult to tell apart from true labor because these contractions can be forceful and people have different pain tolerances. You should not feel embarrassed if you go to the hospital with false labor. Sometimes, the only way to tell if you are in true labor is for your health care provider to look for changes in the cervix. °If there are no prenatal problems or other health problems associated with the pregnancy, it is completely safe to be sent home with false labor and await the onset of true labor. °HOW CAN YOU TELL THE DIFFERENCE BETWEEN TRUE AND FALSE LABOR? °False Labor °· The contractions of false labor are usually shorter and not as hard as those of true labor.   °· The contractions are usually irregular.   °· The contractions are often felt in the front of the lower abdomen and in the groin.   °· The contractions may go away when you walk around or change positions while lying down.   °· The contractions get weaker and are shorter lasting as time goes on.   °· The contractions do not usually become progressively stronger, regular, and closer together as with true labor.   °True Labor °· Contractions in true labor last 30-70 seconds, become very regular, usually become more intense, and increase in frequency.   °· The contractions do not go away with walking.   °· The discomfort is usually felt in the top of the uterus and spreads to the lower abdomen and low back.   °· True labor can be  determined by your health care provider with an exam. This will show that the cervix is dilating and getting thinner.   °WHAT TO REMEMBER °· Keep up with your usual exercises and follow other instructions given by your health care provider.   °· Take medicines as directed by your health care provider.   °· Keep your regular prenatal appointments.   °· Eat and drink lightly if you think you are going into labor.   °· If Braxton Hicks contractions are making you uncomfortable:   °¨ Change your position from lying down or resting to walking, or from walking to resting.   °¨ Sit and rest in a tub of warm water.   °¨ Drink 2-3 glasses of water. Dehydration may cause these contractions.   °¨ Do slow and deep breathing several times an hour.   °WHEN SHOULD I SEEK IMMEDIATE MEDICAL CARE? °Seek immediate medical care if: °· Your contractions become stronger, more regular, and closer together.   °· You have fluid leaking or gushing from your vagina.   °· You have a fever.   °· You pass blood-tinged mucus.   °· You have vaginal bleeding.   °· You have continuous abdominal pain.   °· You have low back pain that you never had before.   °· You feel your baby's head pushing down and causing pelvic pressure.   °· Your baby is not moving as much as it used to.   °Document Released: 01/02/2005 Document Revised: 01/07/2013 Document Reviewed: 10/14/2012 °ExitCare® Patient Information ©2015 [employer]. This information is not intended to replace advice given to you by your health care   provider. Make sure you discuss any questions you have with your health care provider. ° °

## 2014-04-01 NOTE — Progress Notes (Signed)
No complaints. Reports feeling fetal movement. No contractions.

## 2014-04-29 ENCOUNTER — Encounter: Payer: Self-pay | Admitting: Physician Assistant

## 2014-04-29 ENCOUNTER — Ambulatory Visit (INDEPENDENT_AMBULATORY_CARE_PROVIDER_SITE_OTHER): Payer: Medicaid Other | Admitting: Physician Assistant

## 2014-04-29 VITALS — BP 120/74 | HR 93 | Temp 98.1°F | Wt 220.4 lb

## 2014-04-29 DIAGNOSIS — Z3492 Encounter for supervision of normal pregnancy, unspecified, second trimester: Secondary | ICD-10-CM

## 2014-04-29 LAB — POCT URINALYSIS DIP (DEVICE)
Bilirubin Urine: NEGATIVE
Glucose, UA: NEGATIVE mg/dL
HGB URINE DIPSTICK: NEGATIVE
Ketones, ur: NEGATIVE mg/dL
NITRITE: NEGATIVE
PROTEIN: NEGATIVE mg/dL
SPECIFIC GRAVITY, URINE: 1.02 (ref 1.005–1.030)
UROBILINOGEN UA: 0.2 mg/dL (ref 0.0–1.0)
pH: 7 (ref 5.0–8.0)

## 2014-04-29 NOTE — Progress Notes (Signed)
24 weeks, complaining of increased vaginal pressure.  Declines pelvic exam.  States there is no chance of STD.  Denies VB, vaginal discharge, LOF, dysuria.  Endorses good fetal movement.  Also complaining of leg cramping and continued nausea.   She has not taken PNV in some time.  Encouraged to use PNV or flintstones (2/day) daily Nausea medication to take during pregnancy:   Unisom (doxylamine succinate 25 mg tablets) Take one tablet daily at bedtime. If symptoms are not adequately controlled, the dose can be increased to a maximum recommended dose of two tablets daily (1/2 tablet in the morning, 1/2 tablet mid-afternoon and one at bedtime).  Vitamin B6 100mg  tablets. Take one tablet twice a day (up to 200 mg per day).  RTC 4 weeks.

## 2014-04-29 NOTE — Progress Notes (Signed)
LL

## 2014-04-29 NOTE — Progress Notes (Signed)
Patient states she is still feeling nauseous, phenergan doesn't help

## 2014-04-29 NOTE — Patient Instructions (Addendum)
Nausea medication to take during pregnancy:   Unisom (doxylamine succinate 25 mg tablets) Take one tablet daily at bedtime. If symptoms are not adequately controlled, the dose can be increased to a maximum recommended dose of two tablets daily (1/2 tablet in the morning, 1/2 tablet mid-afternoon and one at bedtime).  Vitamin B6 100mg tablets. Take one tablet twice a day (up to 200 mg per day).   Second Trimester of Pregnancy The second trimester is from week 13 through week 28, months 4 through 6. The second trimester is often a time when you feel your best. Your body has also adjusted to being pregnant, and you begin to feel better physically. Usually, morning sickness has lessened or quit completely, you may have more energy, and you may have an increase in appetite. The second trimester is also a time when the fetus is growing rapidly. At the end of the sixth month, the fetus is about 9 inches long and weighs about 1 pounds. You will likely begin to feel the baby move (quickening) between 18 and 20 weeks of the pregnancy. BODY CHANGES Your body goes through many changes during pregnancy. The changes vary from woman to woman.   Your weight will continue to increase. You will notice your lower abdomen bulging out.  You may begin to get stretch marks on your hips, abdomen, and breasts.  You may develop headaches that can be relieved by medicines approved by your health care provider.  You may urinate more often because the fetus is pressing on your bladder.  You may develop or continue to have heartburn as a result of your pregnancy.  You may develop constipation because certain hormones are causing the muscles that push waste through your intestines to slow down.  You may develop hemorrhoids or swollen, bulging veins (varicose veins).  You may have back pain because of the weight gain and pregnancy hormones relaxing your joints between the bones in your pelvis and as a result of a shift  in weight and the muscles that support your balance.  Your breasts will continue to grow and be tender.  Your gums may bleed and may be sensitive to brushing and flossing.  Dark spots or blotches (chloasma, mask of pregnancy) may develop on your face. This will likely fade after the baby is born.  A dark line from your belly button to the pubic area (linea nigra) may appear. This will likely fade after the baby is born.  You may have changes in your hair. These can include thickening of your hair, rapid growth, and changes in texture. Some women also have hair loss during or after pregnancy, or hair that feels dry or thin. Your hair will most likely return to normal after your baby is born. WHAT TO EXPECT AT YOUR PRENATAL VISITS During a routine prenatal visit:  You will be weighed to make sure you and the fetus are growing normally.  Your blood pressure will be taken.  Your abdomen will be measured to track your baby's growth.  The fetal heartbeat will be listened to.  Any test results from the previous visit will be discussed. Your health care provider may ask you:  How you are feeling.  If you are feeling the baby move.  If you have had any abnormal symptoms, such as leaking fluid, bleeding, severe headaches, or abdominal cramping.  If you have any questions. Other tests that may be performed during your second trimester include:  Blood tests that check for:    Low iron levels (anemia).  Gestational diabetes (between 24 and 28 weeks).  Rh antibodies.  Urine tests to check for infections, diabetes, or protein in the urine.  An ultrasound to confirm the proper growth and development of the baby.  An amniocentesis to check for possible genetic problems.  Fetal screens for spina bifida and Down syndrome. HOME CARE INSTRUCTIONS   Avoid all smoking, herbs, alcohol, and unprescribed drugs. These chemicals affect the formation and growth of the baby.  Follow your health  care provider's instructions regarding medicine use. There are medicines that are either safe or unsafe to take during pregnancy.  Exercise only as directed by your health care provider. Experiencing uterine cramps is a good sign to stop exercising.  Continue to eat regular, healthy meals.  Wear a good support bra for breast tenderness.  Do not use hot tubs, steam rooms, or saunas.  Wear your seat belt at all times when driving.  Avoid raw meat, uncooked cheese, cat litter boxes, and soil used by cats. These carry germs that can cause birth defects in the baby.  Take your prenatal vitamins.  Try taking a stool softener (if your health care provider approves) if you develop constipation. Eat more high-fiber foods, such as fresh vegetables or fruit and whole grains. Drink plenty of fluids to keep your urine clear or pale yellow.  Take warm sitz baths to soothe any pain or discomfort caused by hemorrhoids. Use hemorrhoid cream if your health care provider approves.  If you develop varicose veins, wear support hose. Elevate your feet for 15 minutes, 3-4 times a day. Limit salt in your diet.  Avoid heavy lifting, wear low heel shoes, and practice good posture.  Rest with your legs elevated if you have leg cramps or low back pain.  Visit your dentist if you have not gone yet during your pregnancy. Use a soft toothbrush to brush your teeth and be gentle when you floss.  A sexual relationship may be continued unless your health care provider directs you otherwise.  Continue to go to all your prenatal visits as directed by your health care provider. SEEK MEDICAL CARE IF:   You have dizziness.  You have mild pelvic cramps, pelvic pressure, or nagging pain in the abdominal area.  You have persistent nausea, vomiting, or diarrhea.  You have a bad smelling vaginal discharge.  You have pain with urination. SEEK IMMEDIATE MEDICAL CARE IF:   You have a fever.  You are leaking fluid from  your vagina.  You have spotting or bleeding from your vagina.  You have severe abdominal cramping or pain.  You have rapid weight gain or loss.  You have shortness of breath with chest pain.  You notice sudden or extreme swelling of your face, hands, ankles, feet, or legs.  You have not felt your baby move in over an hour.  You have severe headaches that do not go away with medicine.  You have vision changes. Document Released: 12/27/2000 Document Revised: 01/07/2013 Document Reviewed: 03/05/2012 Exit[employer] Patient Information 2015 [employer]. This information is not intended to replace advice given to you by your health care provider. Make sure you discuss any questions you have with your health care provider.  

## 2014-05-28 ENCOUNTER — Ambulatory Visit (INDEPENDENT_AMBULATORY_CARE_PROVIDER_SITE_OTHER): Payer: Medicaid Other | Admitting: Certified Nurse Midwife

## 2014-05-28 VITALS — BP 121/70 | HR 78 | Temp 98.7°F | Wt 215.1 lb

## 2014-05-28 DIAGNOSIS — Z91048 Other nonmedicinal substance allergy status: Secondary | ICD-10-CM

## 2014-05-28 DIAGNOSIS — Z9109 Other allergy status, other than to drugs and biological substances: Secondary | ICD-10-CM

## 2014-05-28 DIAGNOSIS — Z3493 Encounter for supervision of normal pregnancy, unspecified, third trimester: Secondary | ICD-10-CM

## 2014-05-28 LAB — POCT URINALYSIS DIP (DEVICE)
Bilirubin Urine: NEGATIVE
Glucose, UA: NEGATIVE mg/dL
Hgb urine dipstick: NEGATIVE
Ketones, ur: NEGATIVE mg/dL
Nitrite: NEGATIVE
Protein, ur: NEGATIVE mg/dL
Specific Gravity, Urine: 1.02 (ref 1.005–1.030)
Urobilinogen, UA: 1 mg/dL (ref 0.0–1.0)
pH: 7 (ref 5.0–8.0)

## 2014-05-28 MED ORDER — CETIRIZINE HCL 10 MG PO CHEW: 10.0000 mg | CHEWABLE_TABLET | Freq: Every day | ORAL | Status: DC

## 2014-05-28 NOTE — Progress Notes (Signed)
TM- transluscent bilaterally. Advised allergy medicine. [facility]. Pt will return for separate lab appointment to do glucola.

## 2014-05-28 NOTE — Patient Instructions (Signed)
Second Trimester of Pregnancy The second trimester is from week 13 through week 28, months 4 through 6. The second trimester is often a time when you feel your best. Your body has also adjusted to being pregnant, and you begin to feel better physically. Usually, morning sickness has lessened or quit completely, you may have more energy, and you may have an increase in appetite. The second trimester is also a time when the fetus is growing rapidly. At the end of the sixth month, the fetus is about 9 inches long and weighs about 1 pounds. You will likely begin to feel the baby move (quickening) between 18 and 20 weeks of the pregnancy. BODY CHANGES Your body goes through many changes during pregnancy. The changes vary from woman to woman.   Your weight will continue to increase. You will notice your lower abdomen bulging out.  You may begin to get stretch marks on your hips, abdomen, and breasts.  You may develop headaches that can be relieved by medicines approved by your health care provider.  You may urinate more often because the fetus is pressing on your bladder.  You may develop or continue to have heartburn as a result of your pregnancy.  You may develop constipation because certain hormones are causing the muscles that push waste through your intestines to slow down.  You may develop hemorrhoids or swollen, bulging veins (varicose veins).  You may have back pain because of the weight gain and pregnancy hormones relaxing your joints between the bones in your pelvis and as a result of a shift in weight and the muscles that support your balance.  Your breasts will continue to grow and be tender.  Your gums may bleed and may be sensitive to brushing and flossing.  Dark spots or blotches (chloasma, mask of pregnancy) may develop on your face. This will likely fade after the baby is born.  A dark line from your belly button to the pubic area (linea nigra) may appear. This will likely fade  after the baby is born.  You may have changes in your hair. These can include thickening of your hair, rapid growth, and changes in texture. Some women also have hair loss during or after pregnancy, or hair that feels dry or thin. Your hair will most likely return to normal after your baby is born. WHAT TO EXPECT AT YOUR PRENATAL VISITS During a routine prenatal visit:  You will be weighed to make sure you and the fetus are growing normally.  Your blood pressure will be taken.  Your abdomen will be measured to track your baby's growth.  The fetal heartbeat will be listened to.  Any test results from the previous visit will be discussed. Your health care provider may ask you:  How you are feeling.  If you are feeling the baby move.  If you have had any abnormal symptoms, such as leaking fluid, bleeding, severe headaches, or abdominal cramping.  If you have any questions. Other tests that may be performed during your second trimester include:  Blood tests that check for:  Low iron levels (anemia).  Gestational diabetes (between 24 and 28 weeks).  Rh antibodies.  Urine tests to check for infections, diabetes, or protein in the urine.  An ultrasound to confirm the proper growth and development of the baby.  An amniocentesis to check for possible genetic problems.  Fetal screens for spina bifida and Down syndrome. HOME CARE INSTRUCTIONS   Avoid all smoking, herbs, alcohol, and unprescribed   drugs. These chemicals affect the formation and growth of the baby.  Follow your health care provider's instructions regarding medicine use. There are medicines that are either safe or unsafe to take during pregnancy.  Exercise only as directed by your health care provider. Experiencing uterine cramps is a good sign to stop exercising.  Continue to eat regular, healthy meals.  Wear a good support bra for breast tenderness.  Do not use hot tubs, steam rooms, or saunas.  Wear your  seat belt at all times when driving.  Avoid raw meat, uncooked cheese, cat litter boxes, and soil used by cats. These carry germs that can cause birth defects in the baby.  Take your prenatal vitamins.  Try taking a stool softener (if your health care provider approves) if you develop constipation. Eat more high-fiber foods, such as fresh vegetables or fruit and whole grains. Drink plenty of fluids to keep your urine clear or pale yellow.  Take warm sitz baths to soothe any pain or discomfort caused by hemorrhoids. Use hemorrhoid cream if your health care provider approves.  If you develop varicose veins, wear support hose. Elevate your feet for 15 minutes, 3-4 times a day. Limit salt in your diet.  Avoid heavy lifting, wear low heel shoes, and practice good posture.  Rest with your legs elevated if you have leg cramps or low back pain.  Visit your dentist if you have not gone yet during your pregnancy. Use a soft toothbrush to brush your teeth and be gentle when you floss.  A sexual relationship may be continued unless your health care provider directs you otherwise.  Continue to go to all your prenatal visits as directed by your health care provider. SEEK MEDICAL CARE IF:   You have dizziness.  You have mild pelvic cramps, pelvic pressure, or nagging pain in the abdominal area.  You have persistent nausea, vomiting, or diarrhea.  You have a bad smelling vaginal discharge.  You have pain with urination. SEEK IMMEDIATE MEDICAL CARE IF:   You have a fever.  You are leaking fluid from your vagina.  You have spotting or bleeding from your vagina.  You have severe abdominal cramping or pain.  You have rapid weight gain or loss.  You have shortness of breath with chest pain.  You notice sudden or extreme swelling of your face, hands, ankles, feet, or legs.  You have not felt your baby move in over an hour.  You have severe headaches that do not go away with  medicine.  You have vision changes. Document Released: 12/27/2000 Document Revised: 01/07/2013 Document Reviewed: 03/05/2012 Exit[employer] Patient Information 2015 [employer]. This information is not intended to replace advice given to you by your health care provider. Make sure you discuss any questions you have with your health care provider.  

## 2014-05-28 NOTE — Progress Notes (Signed)
C/o cold and allergy symptoms including congestion and ear infection.

## 2014-06-01 ENCOUNTER — Other Ambulatory Visit: Payer: Self-pay

## 2014-06-03 ENCOUNTER — Other Ambulatory Visit: Payer: Medicaid Other

## 2014-06-03 DIAGNOSIS — Z3492 Encounter for supervision of normal pregnancy, unspecified, second trimester: Secondary | ICD-10-CM

## 2014-06-03 LAB — CBC
HEMATOCRIT: 34.3 % — AB (ref 36.0–46.0)
Hemoglobin: 11.4 g/dL — ABNORMAL LOW (ref 12.0–15.0)
MCH: 28.9 pg (ref 26.0–34.0)
MCHC: 33.2 g/dL (ref 30.0–36.0)
MCV: 87.1 fL (ref 78.0–100.0)
MPV: 9.9 fL (ref 8.6–12.4)
PLATELETS: 215 10*3/uL (ref 150–400)
RBC: 3.94 MIL/uL (ref 3.87–5.11)
RDW: 13.7 % (ref 11.5–15.5)
WBC: 6.6 10*3/uL (ref 4.0–10.5)

## 2014-06-03 MED ORDER — ONDANSETRON 4 MG PO TBDP: 4.0000 mg | ORAL_TABLET | Freq: Three times a day (TID) | ORAL | Status: DC | PRN

## 2014-06-04 LAB — HIV ANTIBODY (ROUTINE TESTING W REFLEX): HIV: NONREACTIVE

## 2014-06-04 LAB — GLUCOSE TOLERANCE, 1 HOUR (50G) W/O FASTING: GLUCOSE 1 HOUR GTT: 110 mg/dL (ref 70–140)

## 2014-06-04 LAB — RPR

## 2014-06-18 ENCOUNTER — Encounter: Payer: Self-pay | Admitting: Advanced Practice Midwife

## 2014-06-30 ENCOUNTER — Encounter: Payer: Self-pay | Admitting: Obstetrics & Gynecology

## 2014-06-30 ENCOUNTER — Ambulatory Visit (INDEPENDENT_AMBULATORY_CARE_PROVIDER_SITE_OTHER): Payer: Medicaid Other | Admitting: Family Medicine

## 2014-06-30 VITALS — BP 127/88 | HR 76 | Temp 98.6°F | Wt 219.5 lb

## 2014-06-30 DIAGNOSIS — Z3492 Encounter for supervision of normal pregnancy, unspecified, second trimester: Secondary | ICD-10-CM

## 2014-06-30 LAB — POCT URINALYSIS DIP (DEVICE)
Bilirubin Urine: NEGATIVE
Glucose, UA: NEGATIVE mg/dL
Hgb urine dipstick: NEGATIVE
Ketones, ur: NEGATIVE mg/dL
Nitrite: NEGATIVE
PROTEIN: 30 mg/dL — AB
Specific Gravity, Urine: 1.02 (ref 1.005–1.030)
UROBILINOGEN UA: 4 mg/dL — AB (ref 0.0–1.0)
pH: 7 (ref 5.0–8.0)

## 2014-06-30 NOTE — Patient Instructions (Signed)

## 2014-06-30 NOTE — Progress Notes (Signed)
Patient is 24 y.o. G1P0000 [redacted]w[redacted]d.  +FM, denies LOF, VB, contractions, vaginal discharge.  Overall feeling well.

## 2014-06-30 NOTE — Progress Notes (Signed)
Pain

## 2014-07-14 ENCOUNTER — Ambulatory Visit (INDEPENDENT_AMBULATORY_CARE_PROVIDER_SITE_OTHER): Payer: Medicaid Other | Admitting: Obstetrics & Gynecology

## 2014-07-14 ENCOUNTER — Ambulatory Visit (HOSPITAL_COMMUNITY)
Admission: RE | Admit: 2014-07-14 | Discharge: 2014-07-14 | Disposition: A | Discharge status: Home / Self Care | Payer: Medicaid Other | Source: Ambulatory Visit | Attending: Family Medicine | Admitting: Family Medicine

## 2014-07-14 VITALS — BP 133/88 | HR 79 | Temp 98.7°F | Wt 223.3 lb

## 2014-07-14 DIAGNOSIS — Z3493 Encounter for supervision of normal pregnancy, unspecified, third trimester: Secondary | ICD-10-CM

## 2014-07-14 DIAGNOSIS — O288 Other abnormal findings on antenatal screening of mother: Secondary | ICD-10-CM

## 2014-07-14 DIAGNOSIS — O289 Unspecified abnormal findings on antenatal screening of mother: Secondary | ICD-10-CM | POA: Diagnosis not present

## 2014-07-14 DIAGNOSIS — O368190 Decreased fetal movements, unspecified trimester, not applicable or unspecified: Secondary | ICD-10-CM | POA: Diagnosis not present

## 2014-07-14 DIAGNOSIS — Z3A35 35 weeks gestation of pregnancy: Secondary | ICD-10-CM | POA: Insufficient documentation

## 2014-07-14 LAB — POCT URINALYSIS DIP (DEVICE)
GLUCOSE, UA: NEGATIVE mg/dL
Nitrite: NEGATIVE
Protein, ur: 30 mg/dL — AB
SPECIFIC GRAVITY, URINE: 1.025 (ref 1.005–1.030)
Urobilinogen, UA: 2 mg/dL — ABNORMAL HIGH (ref 0.0–1.0)
pH: 7 (ref 5.0–8.0)

## 2014-07-14 NOTE — Patient Instructions (Addendum)
Levonorgestrel intrauterine device (IUD) What is this medicine? LEVONORGESTREL IUD (LEE voe nor jes trel) is a contraceptive (birth control) device. The device is placed inside the uterus by a healthcare professional. It is used to prevent pregnancy and can also be used to treat heavy bleeding that occurs during your period. Depending on the device, it can be used for 3 to 5 years. This medicine may be used for other purposes; ask your health care provider or pharmacist if you have questions. COMMON BRAND NAME(S): LILETTA, Mirena, Skyla What should I tell my health care provider before I take this medicine? They need to know if you have any of these conditions: -abnormal Pap smear -cancer of the breast, uterus, or cervix -diabetes -endometritis -genital or pelvic infection now or in the past -have more than one sexual partner or your partner has more than one partner -heart disease -history of an ectopic or tubal pregnancy -immune system problems -IUD in place -liver disease or tumor -problems with blood clots or take blood-thinners -use intravenous drugs -uterus of unusual shape -vaginal bleeding that has not been explained -an unusual or allergic reaction to levonorgestrel, other hormones, silicone, or polyethylene, medicines, foods, dyes, or preservatives -pregnant or trying to get pregnant -breast-feeding How should I use this medicine? This device is placed inside the uterus by a health care professional. Talk to your pediatrician regarding the use of this medicine in children. Special care may be needed. Overdosage: If you think you have taken too much of this medicine contact a poison control center or emergency room at once. NOTE: This medicine is only for you. Do not share this medicine with others. What if I miss a dose? This does not apply. What may interact with this medicine? Do not take this medicine with any of the following  medications: -amprenavir -bosentan -fosamprenavir This medicine may also interact with the following medications: -aprepitant -barbiturate medicines for inducing sleep or treating seizures -bexarotene -griseofulvin -medicines to treat seizures like carbamazepine, ethotoin, felbamate, oxcarbazepine, phenytoin, topiramate -modafinil -pioglitazone -rifabutin -rifampin -rifapentine -some medicines to treat HIV infection like atazanavir, indinavir, lopinavir, nelfinavir, tipranavir, ritonavir -St. John's wort -warfarin This list may not describe all possible interactions. Give your health care provider a list of all the medicines, herbs, non-prescription drugs, or dietary supplements you use. Also tell them if you smoke, drink alcohol, or use illegal drugs. Some items may interact with your medicine. What should I watch for while using this medicine? Visit your doctor or health care professional for regular check ups. See your doctor if you or your partner has sexual contact with others, becomes HIV positive, or gets a sexual transmitted disease. This product does not protect you against HIV infection (AIDS) or other sexually transmitted diseases. You can check the placement of the IUD yourself by reaching up to the top of your vagina with clean fingers to feel the threads. Do not pull on the threads. It is a good habit to check placement after each menstrual period. Call your doctor right away if you feel more of the IUD than just the threads or if you cannot feel the threads at all. The IUD may come out by itself. You may become pregnant if the device comes out. If you notice that the IUD has come out use a backup birth control method like condoms and call your health care provider. Using tampons will not change the position of the IUD and are okay to use during your period. What side effects may   I notice from receiving this medicine? Side effects that you should report to your doctor or  health care professional as soon as possible: -allergic reactions like skin rash, itching or hives, swelling of the face, lips, or tongue -fever, flu-like symptoms -genital sores -high blood pressure -no menstrual period for 6 weeks during use -pain, swelling, warmth in the leg -pelvic pain or tenderness -severe or sudden headache -signs of pregnancy -stomach cramping -sudden shortness of breath -trouble with balance, talking, or walking -unusual vaginal bleeding, discharge -yellowing of the eyes or skin Side effects that usually do not require medical attention (report to your doctor or health care professional if they continue or are bothersome): -acne -breast pain -change in sex drive or performance -changes in weight -cramping, dizziness, or faintness while the device is being inserted -headache -irregular menstrual bleeding within first 3 to 6 months of use -nausea This list may not describe all possible side effects. Call your doctor for medical advice about side effects. You may report side effects to FDA at 1-800-FDA-1088. Where should I keep my medicine? This does not apply. NOTE: This sheet is a summary. It may not cover all possible information. If you have questions about this medicine, talk to your doctor, pharmacist, or health care provider.  2015, [employer] Standard. (2011-02-02 13:54:04) Fetal Movement Counts Patient Name: __________________________________________________ Patient Due Date: ____________________ Performing a fetal movement count is highly recommended in high-risk pregnancies, but it is good for every pregnant woman to do. Your health care provider may ask you to start counting fetal movements at 28 weeks of the pregnancy. Fetal movements often increase:  After eating a full meal.  After physical activity.  After eating or drinking something sweet or cold.  At rest. Pay attention to when you feel the baby is most active. This will help you  notice a pattern of your baby's sleep and wake cycles and what factors contribute to an increase in fetal movement. It is important to perform a fetal movement count at the same time each day when your baby is normally most active.  HOW TO COUNT FETAL MOVEMENTS 1. Find a quiet and comfortable area to sit or lie down on your left side. Lying on your left side provides the best blood and oxygen circulation to your baby. 2. Write down the day and time on a sheet of paper or in a journal. 3. Start counting kicks, flutters, swishes, rolls, or jabs in a 2-hour period. You should feel at least 10 movements within 2 hours. 4. If you do not feel 10 movements in 2 hours, wait 2-3 hours and count again. Look for a change in the pattern or not enough counts in 2 hours. SEEK MEDICAL CARE IF:  You feel less than 10 counts in 2 hours, tried twice.  There is no movement in over an hour.  The pattern is changing or taking longer each day to reach 10 counts in 2 hours.  You feel the baby is not moving as he or she usually does. Date: ____________ Movements: ____________ Start time: ____________ Doreatha Martin time: ____________  Date: ____________ Movements: ____________ Start time: ____________ Doreatha Martin time: ____________ Date: ____________ Movements: ____________ Start time: ____________ Doreatha Martin time: ____________ Date: ____________ Movements: ____________ Start time: ____________ Doreatha Martin time: ____________ Date: ____________ Movements: ____________ Start time: ____________ Doreatha Martin time: ____________ Date: ____________ Movements: ____________ Start time: ____________ Doreatha Martin time: ____________ Date: ____________ Movements: ____________ Start time: ____________ Doreatha Martin time: ____________ Date: ____________ Movements: ____________ Start time: ____________ Doreatha Martin time:  ____________  Date: ____________ Movements: ____________ Start time: ____________ Doreatha MartinFinish time: ____________ Date: ____________ Movements: ____________ Start  time: ____________ Doreatha MartinFinish time: ____________ Date: ____________ Movements: ____________ Start time: ____________ Doreatha MartinFinish time: ____________ Date: ____________ Movements: ____________ Start time: ____________ Doreatha MartinFinish time: ____________ Date: ____________ Movements: ____________ Start time: ____________ Doreatha MartinFinish time: ____________ Date: ____________ Movements: ____________ Start time: ____________ Doreatha MartinFinish time: ____________ Date: ____________ Movements: ____________ Start time: ____________ Doreatha MartinFinish time: ____________  Date: ____________ Movements: ____________ Start time: ____________ Doreatha MartinFinish time: ____________ Date: ____________ Movements: ____________ Start time: ____________ Doreatha MartinFinish time: ____________ Date: ____________ Movements: ____________ Start time: ____________ Doreatha MartinFinish time: ____________ Date: ____________ Movements: ____________ Start time: ____________ Doreatha MartinFinish time: ____________ Date: ____________ Movements: ____________ Start time: ____________ Doreatha MartinFinish time: ____________ Date: ____________ Movements: ____________ Start time: ____________ Doreatha MartinFinish time: ____________ Date: ____________ Movements: ____________ Start time: ____________ Doreatha MartinFinish time: ____________  Date: ____________ Movements: ____________ Start time: ____________ Doreatha MartinFinish time: ____________ Date: ____________ Movements: ____________ Start time: ____________ Doreatha MartinFinish time: ____________ Date: ____________ Movements: ____________ Start time: ____________ Doreatha MartinFinish time: ____________ Date: ____________ Movements: ____________ Start time: ____________ Doreatha MartinFinish time: ____________ Date: ____________ Movements: ____________ Start time: ____________ Doreatha MartinFinish time: ____________ Date: ____________ Movements: ____________ Start time: ____________ Doreatha MartinFinish time: ____________ Date: ____________ Movements: ____________ Start time: ____________ Doreatha MartinFinish time: ____________  Date: ____________ Movements: ____________ Start time: ____________ Doreatha MartinFinish time:  ____________ Date: ____________ Movements: ____________ Start time: ____________ Doreatha MartinFinish time: ____________ Date: ____________ Movements: ____________ Start time: ____________ Doreatha MartinFinish time: ____________ Date: ____________ Movements: ____________ Start time: ____________ Doreatha MartinFinish time: ____________ Date: ____________ Movements: ____________ Start time: ____________ Doreatha MartinFinish time: ____________ Date: ____________ Movements: ____________ Start time: ____________ Doreatha MartinFinish time: ____________ Date: ____________ Movements: ____________ Start time: ____________ Doreatha MartinFinish time: ____________  Date: ____________ Movements: ____________ Start time: ____________ Doreatha MartinFinish time: ____________ Date: ____________ Movements: ____________ Start time: ____________ Doreatha MartinFinish time: ____________ Date: ____________ Movements: ____________ Start time: ____________ Doreatha MartinFinish time: ____________ Date: ____________ Movements: ____________ Start time: ____________ Doreatha MartinFinish time: ____________ Date: ____________ Movements: ____________ Start time: ____________ Doreatha MartinFinish time: ____________ Date: ____________ Movements: ____________ Start time: ____________ Doreatha MartinFinish time: ____________ Date: ____________ Movements: ____________ Start time: ____________ Doreatha MartinFinish time: ____________  Date: ____________ Movements: ____________ Start time: ____________ Doreatha MartinFinish time: ____________ Date: ____________ Movements: ____________ Start time: ____________ Doreatha MartinFinish time: ____________ Date: ____________ Movements: ____________ Start time: ____________ Doreatha MartinFinish time: ____________ Date: ____________ Movements: ____________ Start time: ____________ Doreatha MartinFinish time: ____________ Date: ____________ Movements: ____________ Start time: ____________ Doreatha MartinFinish time: ____________ Date: ____________ Movements: ____________ Start time: ____________ Doreatha MartinFinish time: ____________ Date: ____________ Movements: ____________ Start time: ____________ Doreatha MartinFinish time: ____________  Date: ____________ Movements:  ____________ Start time: ____________ Doreatha MartinFinish time: ____________ Date: ____________ Movements: ____________ Start time: ____________ Doreatha MartinFinish time: ____________ Date: ____________ Movements: ____________ Start time: ____________ Doreatha MartinFinish time: ____________ Date: ____________ Movements: ____________ Start time: ____________ Doreatha MartinFinish time: ____________ Date: ____________ Movements: ____________ Start time: ____________ Doreatha MartinFinish time: ____________ Date: ____________ Movements: ____________ Start time: ____________ Doreatha MartinFinish time: ____________ Document Released: 02/01/2006 Document Revised: 05/19/2013 Document Reviewed: 10/30/2011 Exit[employer] Patient Information 2015 [employer]. This information is not intended to replace advice given to you by your health care provider. Make sure you discuss any questions you have with your health care provider.

## 2014-07-14 NOTE — Progress Notes (Signed)
Subjective:  [name] CoombesJustina [name] is a 24 y.o. G1P0000 at 5321w3d being seen today for ongoing prenatal care.  Contractions: Not present.  Vag. Bleeding: None. Movement: (!) Decreased. Denies leaking of fluid.   The following portions of the patient's history were reviewed and updated as appropriate: allergies, current medications, past family history, past medical history, past social history, past surgical history and problem list.   Objective:   Filed Vitals:   07/14/14 1403  BP: 133/88  Pulse: 79  Temp: 98.7 F (37.1 C)  Weight: 223 lb 4.8 oz (101.288 kg)    Fetal Status: Fetal Heart Rate (bpm): 145 Fundal Height: 35 cm Movement: (!) Decreased     General:  Alert, oriented and cooperative. Patient is in no acute distress.  Skin: Skin is warm and dry. No rash noted.   Cardiovascular: Normal heart rate noted  Respiratory: Normal respiratory effort, no problems with respiration noted  Abdomen: Soft, gravid, appropriate for gestational age. Pain/Pressure: Present     Vaginal: Vag. Bleeding: None.       Cervix: Not evaluated        Extremities: Normal range of motion.  Edema: None  Mental Status: Normal mood and affect. Normal behavior. Normal judgment and thought content.   Urinalysis: Urine Protein: 1+ Urine Glucose: Negative (Simultaneous filing. User may not have seen previous data.)  Assessment and Plan:  Pregnancy: G1P0000 at 1421w3d  1. Supervision of normal pregnancy, third trimester For tDap today Pt undecided on contraception - POCT urinalysis dip (device)  2. Decreased fetal movement affecting management of mother, antepartum + FHR but NST nonreactive For BPP today   Preterm labor symptoms and general obstetric precautions including but not limited to vaginal bleeding, contractions, leaking of fluid and fetal movement were reviewed in detail with the patient.  Please refer to After Visit Summary for other counseling recommendations.    Willodean Rosenthalarolyn Harraway-Smith, MD

## 2014-07-14 NOTE — Progress Notes (Signed)
Patient reports she has not felt baby move today- last felt her move last night when she was at work.

## 2014-07-22 ENCOUNTER — Encounter: Payer: Self-pay | Admitting: Certified Nurse Midwife

## 2014-08-06 ENCOUNTER — Encounter (HOSPITAL_COMMUNITY): Payer: Self-pay | Admitting: *Deleted

## 2014-08-06 ENCOUNTER — Inpatient Hospital Stay (HOSPITAL_COMMUNITY)
Admission: AD | Admit: 2014-08-06 | Discharge: 2014-08-06 | Discharge status: Against Medical Advice | Payer: Medicaid Other | Source: Ambulatory Visit | Attending: Obstetrics & Gynecology | Admitting: Obstetrics & Gynecology

## 2014-08-06 ENCOUNTER — Inpatient Hospital Stay (HOSPITAL_COMMUNITY)
Admission: AD | Admit: 2014-08-06 | Discharge: 2014-08-09 | DRG: 775 | Disposition: A | Discharge status: Home / Self Care | Payer: Medicaid Other | Source: Ambulatory Visit | Attending: Family Medicine | Admitting: Family Medicine

## 2014-08-06 ENCOUNTER — Ambulatory Visit (INDEPENDENT_AMBULATORY_CARE_PROVIDER_SITE_OTHER): Payer: Medicaid Other | Admitting: Family Medicine

## 2014-08-06 VITALS — BP 142/94 | HR 108 | Temp 98.4°F | Wt 226.0 lb

## 2014-08-06 DIAGNOSIS — O133 Gestational [pregnancy-induced] hypertension without significant proteinuria, third trimester: Secondary | ICD-10-CM | POA: Diagnosis present

## 2014-08-06 DIAGNOSIS — Z9109 Other allergy status, other than to drugs and biological substances: Secondary | ICD-10-CM

## 2014-08-06 DIAGNOSIS — Z3493 Encounter for supervision of normal pregnancy, unspecified, third trimester: Secondary | ICD-10-CM

## 2014-08-06 DIAGNOSIS — O2342 Unspecified infection of urinary tract in pregnancy, second trimester: Secondary | ICD-10-CM

## 2014-08-06 DIAGNOSIS — F418 Other specified anxiety disorders: Secondary | ICD-10-CM | POA: Diagnosis not present

## 2014-08-06 DIAGNOSIS — O169 Unspecified maternal hypertension, unspecified trimester: Secondary | ICD-10-CM | POA: Diagnosis present

## 2014-08-06 DIAGNOSIS — Z87891 Personal history of nicotine dependence: Secondary | ICD-10-CM

## 2014-08-06 DIAGNOSIS — O163 Unspecified maternal hypertension, third trimester: Secondary | ICD-10-CM

## 2014-08-06 DIAGNOSIS — O99344 Other mental disorders complicating childbirth: Secondary | ICD-10-CM | POA: Diagnosis not present

## 2014-08-06 DIAGNOSIS — Z3A38 38 weeks gestation of pregnancy: Secondary | ICD-10-CM | POA: Diagnosis present

## 2014-08-06 DIAGNOSIS — R030 Elevated blood-pressure reading, without diagnosis of hypertension: Secondary | ICD-10-CM | POA: Diagnosis present

## 2014-08-06 DIAGNOSIS — O139 Gestational [pregnancy-induced] hypertension without significant proteinuria, unspecified trimester: Secondary | ICD-10-CM | POA: Diagnosis present

## 2014-08-06 LAB — GROUP B STREP BY PCR: GROUP B STREP BY PCR: NEGATIVE

## 2014-08-06 LAB — URINALYSIS, ROUTINE W REFLEX MICROSCOPIC
BILIRUBIN URINE: NEGATIVE
Glucose, UA: NEGATIVE mg/dL
KETONES UR: NEGATIVE mg/dL
Nitrite: NEGATIVE
PROTEIN: NEGATIVE mg/dL
Specific Gravity, Urine: 1.015 (ref 1.005–1.030)
UROBILINOGEN UA: 0.2 mg/dL (ref 0.0–1.0)
pH: 6.5 (ref 5.0–8.0)

## 2014-08-06 LAB — COMPREHENSIVE METABOLIC PANEL
ALBUMIN: 2.7 g/dL — AB (ref 3.5–5.0)
ALT: 21 U/L (ref 14–54)
AST: 17 U/L (ref 15–41)
Alkaline Phosphatase: 164 U/L — ABNORMAL HIGH (ref 38–126)
Anion gap: 5 (ref 5–15)
BUN: <5 mg/dL — ABNORMAL LOW (ref 6–20)
CALCIUM: 9.3 mg/dL (ref 8.9–10.3)
CHLORIDE: 106 mmol/L (ref 101–111)
CO2: 24 mmol/L (ref 22–32)
CREATININE: 0.65 mg/dL (ref 0.44–1.00)
GFR calc Af Amer: >60 mL/min (ref >60)
GLUCOSE: 95 mg/dL (ref 65–99)
Potassium: 4.3 mmol/L (ref 3.5–5.1)
SODIUM: 135 mmol/L (ref 135–145)
Total Bilirubin: 0.4 mg/dL (ref 0.3–1.2)
Total Protein: 6.1 g/dL — ABNORMAL LOW (ref 6.5–8.1)

## 2014-08-06 LAB — POCT URINALYSIS DIP (DEVICE)
GLUCOSE, UA: NEGATIVE mg/dL
Hgb urine dipstick: NEGATIVE
Ketones, ur: NEGATIVE mg/dL
Nitrite: NEGATIVE
Protein, ur: 100 mg/dL — AB
Specific Gravity, Urine: 1.025 (ref 1.005–1.030)
Urobilinogen, UA: 1 mg/dL (ref 0.0–1.0)
pH: 6.5 (ref 5.0–8.0)

## 2014-08-06 LAB — CBC
HCT: 34.8 % — ABNORMAL LOW (ref 36.0–46.0)
Hemoglobin: 11.6 g/dL — ABNORMAL LOW (ref 12.0–15.0)
MCH: 29.4 pg (ref 26.0–34.0)
MCHC: 33.3 g/dL (ref 30.0–36.0)
MCV: 88.3 fL (ref 78.0–100.0)
PLATELETS: 196 10*3/uL (ref 150–400)
RBC: 3.94 MIL/uL (ref 3.87–5.11)
RDW: 13.1 % (ref 11.5–15.5)
WBC: 6.1 10*3/uL (ref 4.0–10.5)

## 2014-08-06 LAB — TYPE AND SCREEN
ABO/RH(D): A POS
Antibody Screen: NEGATIVE

## 2014-08-06 LAB — URINE MICROSCOPIC-ADD ON

## 2014-08-06 LAB — PROTEIN / CREATININE RATIO, URINE
CREATININE, URINE: 194 mg/dL
Protein Creatinine Ratio: 0.17 mg/mg{Cre} — ABNORMAL HIGH (ref 0.00–0.15)
TOTAL PROTEIN, URINE: 33 mg/dL

## 2014-08-06 LAB — RAPID URINE DRUG SCREEN, HOSP PERFORMED
Amphetamines: NOT DETECTED
BARBITURATES: NOT DETECTED
BENZODIAZEPINES: NOT DETECTED
COCAINE: NOT DETECTED
Opiates: NOT DETECTED
Tetrahydrocannabinol: NOT DETECTED

## 2014-08-06 MED ORDER — OXYCODONE-ACETAMINOPHEN 5-325 MG PO TABS
2.0000 | ORAL_TABLET | ORAL | Status: DC | PRN
  Administered 2014-08-07: 2 via ORAL
  Filled 2014-08-06: qty 2

## 2014-08-06 MED ORDER — TERBUTALINE SULFATE 1 MG/ML IJ SOLN: 0.2500 mg | Freq: Once | INTRAMUSCULAR | Status: AC | PRN

## 2014-08-06 MED ORDER — OXYTOCIN BOLUS FROM INFUSION: 500.0000 mL | INTRAVENOUS | Status: DC

## 2014-08-06 MED ORDER — LACTATED RINGERS IV SOLN
500.0000 mL | INTRAVENOUS | Status: DC | PRN
  Administered 2014-08-06 – 2014-08-07 (×2): 500 mL via INTRAVENOUS

## 2014-08-06 MED ORDER — MISOPROSTOL 25 MCG QUARTER TABLET
25.0000 ug | ORAL_TABLET | ORAL | Status: DC | PRN
  Administered 2014-08-06 (×2): 25 ug via VAGINAL
  Filled 2014-08-06 (×2): qty 0.25
  Filled 2014-08-06: qty 1

## 2014-08-06 MED ORDER — ZOLPIDEM TARTRATE 5 MG PO TABS
5.0000 mg | ORAL_TABLET | Freq: Every evening | ORAL | Status: DC | PRN
  Administered 2014-08-06: 5 mg via ORAL
  Filled 2014-08-06: qty 1

## 2014-08-06 MED ORDER — CITRIC ACID-SODIUM CITRATE 334-500 MG/5ML PO SOLN: 30.0000 mL | ORAL | Status: DC | PRN

## 2014-08-06 MED ORDER — MISOPROSTOL 25 MCG QUARTER TABLET: 25.0000 ug | ORAL_TABLET | ORAL | Status: DC | PRN

## 2014-08-06 MED ORDER — LACTATED RINGERS IV SOLN
INTRAVENOUS | Status: DC
  Administered 2014-08-06 – 2014-08-07 (×4): via INTRAVENOUS

## 2014-08-06 MED ORDER — ONDANSETRON HCL 4 MG/2ML IJ SOLN: 4.0000 mg | Freq: Four times a day (QID) | INTRAMUSCULAR | Status: DC | PRN

## 2014-08-06 MED ORDER — LIDOCAINE HCL (PF) 1 % IJ SOLN
30.0000 mL | INTRAMUSCULAR | Status: AC | PRN
  Administered 2014-08-07: 30 mL via SUBCUTANEOUS
  Filled 2014-08-06: qty 30

## 2014-08-06 MED ORDER — ACETAMINOPHEN 325 MG PO TABS: 650.0000 mg | ORAL_TABLET | ORAL | Status: DC | PRN

## 2014-08-06 MED ORDER — OXYCODONE-ACETAMINOPHEN 5-325 MG PO TABS: 1.0000 | ORAL_TABLET | ORAL | Status: DC | PRN

## 2014-08-06 MED ORDER — OXYTOCIN 40 UNITS IN LACTATED RINGERS INFUSION - SIMPLE MED
62.5000 mL/h | INTRAVENOUS | Status: DC
  Administered 2014-08-07: 62.5 mL/h via INTRAVENOUS
  Filled 2014-08-06: qty 1000

## 2014-08-06 MED ORDER — METRONIDAZOLE 500 MG PO TABS
2000.0000 mg | ORAL_TABLET | Freq: Once | ORAL | Status: AC
  Administered 2014-08-06: 2000 mg via ORAL
  Filled 2014-08-06: qty 4

## 2014-08-06 NOTE — MAU Note (Signed)
Pt sent up from clinic for elevated b/ps. Denies H/A or visual changes.

## 2014-08-06 NOTE — H&P (Signed)
Krystina Strieter is a 24 y.o. female presenting from clinic 2/2 to elevated blood pressures. She states that for the past two days she has had a HA w/o vision changes. She notes no changes in urine/bowels. She is not complaining of contractions. Maternal Medical History:  Reason for admission: Nausea.    OB History    Gravida Para Term Preterm AB TAB SAB Ectopic Multiple Living       Past Medical History  Diagnosis Date  . Depression   . Suicidal overdose   . Bipolar 1 disorder    Past Surgical History  Procedure Laterality Date  . No past surgeries     Family History: family history includes Heart attack in her mother; Hypertension in her father; Stomach cancer in her mother. Social History:  reports that she has quit smoking. Her smoking use included Cigarettes. She has a .5 pack-year smoking history. She has never used smokeless tobacco. She reports that she drinks about 0.6 oz of alcohol per week. She reports that she does not use illicit drugs.   Clinic [facility] Prenatal Labs  Dating 7 week scan Blood type: A/POS/-- (02/17 1418)   Genetic Screen Quad: Neg Antibody:NEG (02/17 1418)  Anatomic [place] Normal Rubella: 2.75 (02/17 1418)  GTT Early:  74         Third trimester:110 RPR: NON REAC (05/18 1539)   Flu vaccine declined HBsAg: NEGATIVE (02/17 1418)   TDaP vaccine            07/15/2014             HIV: NONREACTIVE (05/18 1539)   GBS  GBS:   Contraception  Pap: normal  [employer] breast   Circumcision girl   Pediatrician    Support Person        Prenatal Transfer Tool  Maternal Diabetes: No Genetic Screening: Normal Maternal Ultrasounds/Referrals: Normal Fetal Ultrasounds or other Referrals:  None Maternal Substance Abuse:  No Significant Maternal Medications:  None Significant Maternal Lab Results:  None Other Comments:  None  Review of Systems  Constitutional: Negative for fever, malaise/fatigue and diaphoresis.  Eyes: Negative for blurred vision  and pain.  Respiratory: Negative for sputum production and shortness of breath.   Cardiovascular: Negative for chest pain and leg swelling.  Gastrointestinal: Negative for heartburn, nausea, vomiting, abdominal pain, diarrhea and constipation.  Genitourinary: Negative for dysuria, urgency and frequency.  Skin: Negative for rash.  Neurological: Negative for weakness and headaches.      There were no vitals taken for this visit. Exam Physical Exam  Constitutional: She is oriented to person, place, and time. She appears well-developed and well-nourished. No distress.  HENT:  Head: Normocephalic and atraumatic.  Eyes: Conjunctivae and EOM are normal.  Neck: Normal range of motion. No tracheal deviation present.  Cardiovascular: Normal rate, regular rhythm and normal heart sounds.  Exam reveals no gallop and no friction rub.   No murmur heard. Respiratory: Effort normal and breath sounds normal. No respiratory distress.  GI: Soft. She exhibits no distension. There is no tenderness. There is no rebound and no guarding.  Genitourinary: Vagina normal and uterus normal.  Musculoskeletal: Normal range of motion.  Neurological: She is alert and oriented to person, place, and time.  Skin: Skin is warm and dry. She is not diaphoretic.  Psychiatric: She has a normal mood and affect. Her behavior is normal. Judgment and thought content normal.    Prenatal labs:  ABO, Rh: A/POS/-- (02/17 1418) Antibody: NEG (02/17 1418) Rubella: 2.75 (02/17 1418) RPR: NON REAC (05/18 1539)  HBsAg: NEGATIVE (02/17 1418)  HIV: NONREACTIVE (05/18 1539)  GBS:     Assessment/Plan: Pt sent from clinic 2/2 elevated BPs and concerns for pre-E.  #pre-E labs pending, will begin Mag if appropriate #Pt does not want to be admitted at this time. She wants to return home to obtain her belongings and then return for admission. She was advised against this and educated on the risks to her and the baby if she does have Pre-E.  She is leaving AMA.  #Once she returns, will admit for IOL 2/2 HTN and order routine labor orders    Lowanda Foster 08/06/2014, 5:31 PM  Duplicate note See other H&P  Aviva Signs, CNM

## 2014-08-06 NOTE — Progress Notes (Signed)
C/o headaches somedays, states relieved with going to sleep . Denies visual disturbances. Large leukocytes noted in urinalysis.

## 2014-08-06 NOTE — Progress Notes (Signed)
While admitting patient, when asked if the FOB is involved, she stated that she "bought her baby". She saw the "how to" off of you tube. I asked her if she knew who the father was and said yes, however he is not going to be involved with her or the baby.

## 2014-08-06 NOTE — H&P (Signed)
[name] [name] is a 24 y.o. female presenting from clinic 2/2 to elevated blood pressures. She states that for the past two days she has had a HA w/o vision changes. She notes no changes in urine/bowels. She is not complaining of contractions. Maternal Medical History:  Reason for admission: Nausea.    OB History    Gravida Para Term Preterm AB TAB SAB Ectopic Multiple Living       Past Medical History  Diagnosis Date  . Depression   . Suicidal overdose   . Bipolar 1 disorder    Past Surgical History  Procedure Laterality Date  . No past surgeries     Family History: family history includes Heart attack in her mother; Hypertension in her father; Stomach cancer in her mother. Social History:  reports that she has quit smoking. Her smoking use included Cigarettes. She has a .5 pack-year smoking history. She has never used smokeless tobacco. She reports that she drinks about 0.6 oz of alcohol per week. She reports that she does not use illicit drugs.   Clinic [facility] Prenatal Labs  Dating 7 week scan Blood type: A/POS/-- (02/17 1418)   Genetic Screen Quad: Neg Antibody:NEG (02/17 1418)  Anatomic [place] Normal Rubella: 2.75 (02/17 1418)  GTT Early:  74         Third trimester:110 RPR: NON REAC (05/18 1539)   Flu vaccine declined HBsAg: NEGATIVE (02/17 1418)   TDaP vaccine            07/15/2014             HIV: NONREACTIVE (05/18 1539)   GBS  GBS:   Contraception  Pap: normal  Baby [employer] breast   Circumcision girl   Pediatrician    Support Person        Prenatal Transfer Tool  Maternal Diabetes: No Genetic Screening: Normal Maternal Ultrasounds/Referrals: Normal Fetal Ultrasounds or other Referrals:  None Maternal Substance Abuse:  No Significant Maternal Medications:  None Significant Maternal Lab Results:  None Other Comments:  None  Review of Systems  Constitutional: Negative for fever, malaise/fatigue and diaphoresis.  Eyes: Negative for blurred vision  and pain.  Respiratory: Negative for sputum production and shortness of breath.   Cardiovascular: Negative for chest pain and leg swelling.  Gastrointestinal: Negative for heartburn, nausea, vomiting, abdominal pain, diarrhea and constipation.  Genitourinary: Negative for dysuria, urgency and frequency.  Skin: Negative for rash.  Neurological: Negative for weakness and headaches.      Blood pressure 138/91, pulse 90, resp. rate 18, SpO2 98 %. Maternal Exam:  Abdomen: Patient reports no abdominal tenderness.   Fetal Exam Fetal Monitor Review: Baseline rate: 140s.  Variability: minimal (<5 bpm).   Pattern: no accelerations.       Physical Exam  Constitutional: She is oriented to person, place, and time. She appears well-developed and well-nourished. No distress.  HENT:  Head: Normocephalic and atraumatic.  Eyes: Conjunctivae and EOM are normal.  Neck: Normal range of motion. No tracheal deviation present.  Cardiovascular: Normal rate, regular rhythm and normal heart sounds.  Exam reveals no gallop and no friction rub.   No murmur heard. Respiratory: Effort normal and breath sounds normal. No respiratory distress.  GI: Soft. She exhibits no distension. There is no tenderness. There is no rebound and no guarding.  Genitourinary: Vagina normal and uterus normal.  Musculoskeletal: Normal range of motion.  Neurological: She is alert and oriented to person,  place, and time.  Skin: Skin is warm and dry. She is not diaphoretic.  Psychiatric: She has a normal mood and affect. Her behavior is normal. Judgment and thought content normal.    Prenatal labs: ABO, Rh: A/POS/-- (02/17 1418) Antibody: NEG (02/17 1418) Rubella: 2.75 (02/17 1418) RPR: NON REAC (05/18 1539)  HBsAg: NEGATIVE (02/17 1418)  HIV: NONREACTIVE (05/18 1539)  GBS:     Assessment/Plan: Pt sent from clinic 2/2 elevated BPs and concerns for pre-E.  #pre-E labs pending, will begin Mag if appropriate #Pt does not  want to be admitted at this time. She wants to return home to obtain her belongings and then return for admission. She was advised against this and educated on the risks to her and the baby if she does have Pre-E. She is leaving AMA.  #Once she returns, will admit for IOL 2/2 HTN and order routine labor orders    Lowanda Foster 08/06/2014, 3:04 PM  Seen also by me. Agree with note  FHR reassuring Labs drawn. BPs elevated  Patient adivsed that leaving is AMA and risks reviewed.  Aviva Signs, CNM

## 2014-08-06 NOTE — Plan of Care (Signed)
Problem: Phase I Progression Outcomes Goal: Assess per MD/Nurse,Routine-VS,FHR,UC,Head to Toe assess Outcome: Progressing Discussed elevated BP in pregnancy. S/S to report. HA, blurred vision, upper right quadrant, 5lb weight gain in a short period of time. Will cont to monitor

## 2014-08-06 NOTE — Progress Notes (Signed)
   Sherri Mcarthy is a 24 y.o. G1P0000 at [redacted]w[redacted]d  admitted for induction of labor due to [facility] w/o preeclampsia.  Subjective:  No HA, RUQ pain, vision changes  Objective: Filed Vitals:   08/06/14 1936 08/06/14 1948 08/06/14 2017 08/06/14 2304  BP: 143/95  146/98 131/85  Pulse: 83 87 72 87  Temp: 98.7 F (37.1 C)   98.4 F (36.9 C)  TempSrc: Oral   Oral  Resp: 20   20  Height:      Weight:      SpO2:  92%        FHT:  FHR: 135 bpm, variability: minimal to moderate,  accelerations:  Present,  decelerations:  Absent UC:   irregular, every 2-10 minutes SVE:   Dilation: Fingertip Effacement (%): Thick Station: -2 Exam by:: Cammy Copa, RN   Labs: Lab Results  Component Value Date   WBC 6.1 08/06/2014   HGB 11.6* 08/06/2014   HCT 34.8* 08/06/2014   MCV 88.3 08/06/2014   PLT 196 08/06/2014    Assessment / Plan: IOL for GHTN, ripening phase  Labor: no Fetal Wellbeing:  Category I Pain Control:  Labor support without medications Anticipated MOD:  NSVD  CRESENZO-DISHMAN,Shomari Scicchitano 08/06/2014, 11:44 PM

## 2014-08-06 NOTE — Progress Notes (Signed)
Subjective:  [name] [name] is a 24 y.o. G1P0000 at [redacted]w[redacted]d being seen today for ongoing prenatal care.  Patient reports headache and swelling.  Contractions: Not present.  Vag. Bleeding: None. Movement: Present. Denies leaking of fluid.   The following portions of the patient's history were reviewed and updated as appropriate: allergies, current medications, past family history, past medical history, past social history, past surgical history and problem list.   Objective:   Filed Vitals:   08/06/14 1326 08/06/14 1330  BP: 144/97 142/94  Pulse: 108   Temp: 98.4 F (36.9 C)   Weight: 226 lb (102.513 kg)     Fetal Status: Fetal Heart Rate (bpm): 143   Movement: Present     General:  Alert, oriented and cooperative. Patient is in no acute distress.  Skin: Skin is warm and dry. No rash noted.   Cardiovascular: Normal heart rate noted  Respiratory: Normal respiratory effort, no problems with respiration noted  Abdomen: Soft, gravid, appropriate for gestational age. Pain/Pressure: Present     Vaginal: Vag. Bleeding: None.       Cervix: Not evaluated        Extremities: Normal range of motion.  Edema: None  Mental Status: Normal mood and affect. Normal behavior. Normal judgment and thought content.   Urinalysis: Urine Protein: 2+ Urine Glucose: Negative  Assessment and Plan:  Pregnancy: G1P0000 at [redacted]w[redacted]d  1. Supervision of normal pregnancy, third trimester FHT and Fundal height normal - GC/Chlamydia Probe Amp - Culture, beta strep (group b only)  2. Elevated blood pressure affecting pregnancy in third trimester, antepartum Will send to MAU for CMP, CBC, and urine P:C  Term labor symptoms and general obstetric precautions including but not limited to vaginal bleeding, contractions, leaking of fluid and fetal movement were reviewed in detail with the patient. Please refer to After Visit Summary for other counseling recommendations.  No Follow-up on file.   Levie Heritage, DO

## 2014-08-07 ENCOUNTER — Encounter (HOSPITAL_COMMUNITY): Payer: Self-pay | Admitting: Anesthesiology

## 2014-08-07 ENCOUNTER — Inpatient Hospital Stay (HOSPITAL_COMMUNITY): Payer: Medicaid Other | Admitting: Anesthesiology

## 2014-08-07 DIAGNOSIS — F319 Bipolar disorder, unspecified: Secondary | ICD-10-CM

## 2014-08-07 DIAGNOSIS — O99344 Other mental disorders complicating childbirth: Secondary | ICD-10-CM

## 2014-08-07 DIAGNOSIS — Z3A38 38 weeks gestation of pregnancy: Secondary | ICD-10-CM

## 2014-08-07 DIAGNOSIS — F418 Other specified anxiety disorders: Secondary | ICD-10-CM

## 2014-08-07 DIAGNOSIS — O133 Gestational [pregnancy-induced] hypertension without significant proteinuria, third trimester: Secondary | ICD-10-CM

## 2014-08-07 LAB — CBC
HCT: 34.5 % — ABNORMAL LOW (ref 36.0–46.0)
Hemoglobin: 11.3 g/dL — ABNORMAL LOW (ref 12.0–15.0)
MCH: 28.9 pg (ref 26.0–34.0)
MCHC: 32.8 g/dL (ref 30.0–36.0)
MCV: 88.2 fL (ref 78.0–100.0)
Platelets: 183 10*3/uL (ref 150–400)
RBC: 3.91 MIL/uL (ref 3.87–5.11)
RDW: 13.2 % (ref 11.5–15.5)
WBC: 7.3 10*3/uL (ref 4.0–10.5)

## 2014-08-07 LAB — GC/CHLAMYDIA PROBE AMP
CT PROBE, AMP APTIMA: NEGATIVE
GC Probe RNA: NEGATIVE

## 2014-08-07 LAB — RPR: RPR Ser Ql: NONREACTIVE

## 2014-08-07 LAB — HIV ANTIBODY (ROUTINE TESTING W REFLEX): HIV Screen 4th Generation wRfx: NONREACTIVE

## 2014-08-07 MED ORDER — ZOLPIDEM TARTRATE 5 MG PO TABS: 5.0000 mg | ORAL_TABLET | Freq: Every evening | ORAL | Status: DC | PRN

## 2014-08-07 MED ORDER — ONDANSETRON HCL 4 MG/2ML IJ SOLN
4.0000 mg | INTRAMUSCULAR | Status: DC | PRN
  Administered 2014-08-07: 4 mg via INTRAVENOUS
  Filled 2014-08-07: qty 2

## 2014-08-07 MED ORDER — PRENATAL MULTIVITAMIN CH
1.0000 | ORAL_TABLET | Freq: Every day | ORAL | Status: DC
  Administered 2014-08-08 – 2014-08-09 (×2): 1 via ORAL
  Filled 2014-08-07 (×2): qty 1

## 2014-08-07 MED ORDER — IBUPROFEN 600 MG PO TABS
600.0000 mg | ORAL_TABLET | Freq: Four times a day (QID) | ORAL | Status: DC
  Administered 2014-08-07 – 2014-08-09 (×8): 600 mg via ORAL
  Filled 2014-08-07 (×8): qty 1

## 2014-08-07 MED ORDER — PHENYLEPHRINE 40 MCG/ML (10ML) SYRINGE FOR IV PUSH (FOR BLOOD PRESSURE SUPPORT)
80.0000 ug | PREFILLED_SYRINGE | INTRAVENOUS | Status: DC | PRN
  Filled 2014-08-07: qty 20
  Filled 2014-08-07: qty 2

## 2014-08-07 MED ORDER — SENNOSIDES-DOCUSATE SODIUM 8.6-50 MG PO TABS
2.0000 | ORAL_TABLET | ORAL | Status: DC
  Administered 2014-08-07 – 2014-08-08 (×2): 2 via ORAL
  Filled 2014-08-07 (×2): qty 2

## 2014-08-07 MED ORDER — OXYCODONE-ACETAMINOPHEN 5-325 MG PO TABS
2.0000 | ORAL_TABLET | ORAL | Status: DC | PRN
  Administered 2014-08-07: 2 via ORAL
  Filled 2014-08-07: qty 2

## 2014-08-07 MED ORDER — ONDANSETRON HCL 4 MG PO TABS
4.0000 mg | ORAL_TABLET | ORAL | Status: DC | PRN
Start: 2014-08-07 — End: 2014-08-09

## 2014-08-07 MED ORDER — TETANUS-DIPHTH-ACELL PERTUSSIS 5-2.5-18.5 LF-MCG/0.5 IM SUSP
0.5000 mL | Freq: Once | INTRAMUSCULAR | Status: AC
  Administered 2014-08-08: 0.5 mL via INTRAMUSCULAR
  Filled 2014-08-07: qty 0.5

## 2014-08-07 MED ORDER — DIPHENHYDRAMINE HCL 25 MG PO CAPS: 25.0000 mg | ORAL_CAPSULE | Freq: Four times a day (QID) | ORAL | Status: DC | PRN

## 2014-08-07 MED ORDER — EPHEDRINE 5 MG/ML INJ
10.0000 mg | INTRAVENOUS | Status: DC | PRN
  Filled 2014-08-07: qty 2

## 2014-08-07 MED ORDER — FENTANYL 2.5 MCG/ML BUPIVACAINE 1/10 % EPIDURAL INFUSION (WH - ANES)
14.0000 mL/h | INTRAMUSCULAR | Status: DC | PRN
  Administered 2014-08-07 (×2): 14 mL/h via EPIDURAL
  Filled 2014-08-07: qty 125

## 2014-08-07 MED ORDER — WITCH HAZEL-GLYCERIN EX PADS: 1.0000 "application " | MEDICATED_PAD | CUTANEOUS | Status: DC | PRN

## 2014-08-07 MED ORDER — SIMETHICONE 80 MG PO CHEW: 80.0000 mg | CHEWABLE_TABLET | ORAL | Status: DC | PRN

## 2014-08-07 MED ORDER — ACETAMINOPHEN 325 MG PO TABS: 650.0000 mg | ORAL_TABLET | ORAL | Status: DC | PRN

## 2014-08-07 MED ORDER — LANOLIN HYDROUS EX OINT: TOPICAL_OINTMENT | CUTANEOUS | Status: DC | PRN

## 2014-08-07 MED ORDER — OXYCODONE-ACETAMINOPHEN 5-325 MG PO TABS
1.0000 | ORAL_TABLET | ORAL | Status: DC | PRN
  Administered 2014-08-08: 1 via ORAL
  Filled 2014-08-07: qty 1

## 2014-08-07 MED ORDER — LIDOCAINE HCL (PF) 1 % IJ SOLN
INTRAMUSCULAR | Status: DC | PRN
  Administered 2014-08-07: 9 mL via EPIDURAL
  Administered 2014-08-07: 7 mL via EPIDURAL

## 2014-08-07 MED ORDER — DIBUCAINE 1 % RE OINT: 1.0000 "application " | TOPICAL_OINTMENT | RECTAL | Status: DC | PRN

## 2014-08-07 MED ORDER — DIPHENHYDRAMINE HCL 50 MG/ML IJ SOLN: 12.5000 mg | INTRAMUSCULAR | Status: DC | PRN

## 2014-08-07 MED ORDER — BENZOCAINE-MENTHOL 20-0.5 % EX AERO
1.0000 "application " | INHALATION_SPRAY | CUTANEOUS | Status: DC | PRN
  Filled 2014-08-07: qty 56

## 2014-08-07 NOTE — Progress Notes (Signed)
   [name] [name] is a 24 y.o. G1P0000 at [redacted]w[redacted]d  admitted for induction of labor due to [facility].  Subjective:  Wants epidural.  No HA/RUQ pain/ vision changes Objective: Filed Vitals:   08/07/14 0328 08/07/14 0331 08/07/14 0526 08/07/14 0704  BP:  125/76 126/80 123/83  Pulse:  69 68 73  Temp: 98.1 F (36.7 C)     TempSrc: Oral     Resp:      Height:      Weight:      SpO2:          FHT:  FHR: 125 bpm, variability: minimal ,  accelerations:  Abscent,  decelerations:  Absent UC:   irregular, every 2-5 minutes SVE:   Dilation: 3.5 Effacement (%): 100 Station: -1 Exam by:: [name] [name] CNM  Had SROM clear fluid @ 0500  Labs: Lab Results  Component Value Date   WBC 7.3 08/07/2014   HGB 11.3* 08/07/2014   HCT 34.5* 08/07/2014   MCV 88.2 08/07/2014   PLT 183 08/07/2014    Assessment / Plan: IOL for GHTN, progresing well (last cytotec 2300, no pitocin) Plan epidural Labor: Progressing normally Fetal Wellbeing:  Category I Pain Control:  Labor support without medications Anticipated MOD:  NSVD  [name] [name] [name] [name] 08/07/2014, 7:42 AM

## 2014-08-07 NOTE — Anesthesia Procedure Notes (Signed)
Epidural Patient location during procedure: OB Start time: 08/07/2014 7:59 AM End time: 08/07/2014 8:03 AM  Staffing Anesthesiologist: Leilani Able Performed by: anesthesiologist   Preanesthetic Checklist Completed: patient identified, surgical consent, pre-op evaluation, timeout performed, IV checked, risks and benefits discussed and monitors and equipment checked  Epidural Patient position: sitting Prep: site prepped and draped and DuraPrep Patient monitoring: continuous pulse ox and blood pressure Approach: midline Location: L3-L4 Injection technique: LOR air  Needle:  Needle type: Tuohy  Needle gauge: 17 G Needle length: 9 cm and 9 Needle insertion depth: 8 cm Catheter type: closed end flexible Catheter size: 19 Gauge Catheter at skin depth: 13 cm Test dose: negative and Other  Assessment Sensory level: T8 Events: blood not aspirated, injection not painful, no injection resistance, negative IV test and no paresthesia  Additional Notes Reason for block:procedure for pain

## 2014-08-07 NOTE — Progress Notes (Signed)
UR chart review completed.  

## 2014-08-07 NOTE — Lactation Note (Signed)
This note was copied from the chart of [name] Britnee Custodio. Lactation Consultation Note  Assisted mom with attaching baby to the breast.  Mom has large pendulous breasts so breast was supported on a pillow to aid mom in visualizing nipple.  Baby was sucking on her lips and intraoral tension was high.  Jaw massage and tummy time was done on mom.  She began rooting and opening her mouth wider.  After several attempts she latched in a cross cradle hold.  Nipple was introduced using a teacup hold and an off-center latch.  Mom will need re-inforcement and encouragement.  Aware of support group and outpatient services.  Patient Name: [name] [name] Petron [id number] Date: 08/07/2014 Reason for consult: Initial assessment   Maternal Data Formula Feeding for Exclusion: No Has patient been taught Hand Expression?: Yes Does the patient have breastfeeding experience prior to this delivery?: No  Feeding Feeding Type: Breast Fed Length of feed: 15 min  LATCH Score/Interventions Latch: Repeated attempts needed to sustain latch, nipple held in mouth throughout feeding, stimulation needed to elicit sucking reflex. Intervention(s): Skin to skin Intervention(s): Assist with latch;Adjust position;Breast compression  Audible Swallowing: A few with stimulation  Type of Nipple: Everted at rest and after stimulation (everts with stimulation)  Comfort (Breast/Nipple): Soft / non-tender     Hold (Positioning): Full assist, staff holds infant at breast Intervention(s): Breastfeeding basics reviewed;Support Pillows;Position options;Skin to skin  LATCH Score: 6  Lactation Tools Discussed/Used     Consult Status Consult Status: Follow-up Date: 08/08/14 Follow-up type: In-patient    [name] 08/07/2014, 5:42 PM

## 2014-08-07 NOTE — Anesthesia Preprocedure Evaluation (Signed)
Anesthesia Evaluation  Patient identified by MRN, date of birth, ID band Patient awake    Reviewed: Allergy & Precautions, H&P , Patient's Chart, lab work & pertinent test results  Airway Mallampati: II  TM Distance: >3 FB Neck ROM: full    Dental no notable dental hx.    Pulmonary former smoker,    Pulmonary exam normal        Cardiovascular negative cardio ROS Normal cardiovascular exam     Neuro/Psych negative neurological ROS     GI/Hepatic negative GI ROS, Neg liver ROS,   Endo/Other  negative endocrine ROS  Renal/GU negative Renal ROS     Musculoskeletal   Abdominal (+) + obese,   Peds  Hematology negative hematology ROS (+)   Anesthesia Other Findings   Reproductive/Obstetrics (+) Pregnancy                             Anesthesia Physical Anesthesia Plan  ASA: II  Anesthesia Plan: Epidural   Post-op Pain Management:    Induction:   Airway Management Planned:   Additional Equipment:   Intra-op Plan:   Post-operative Plan:   Informed Consent: I have reviewed the patients History and Physical, chart, labs and discussed the procedure including the risks, benefits and alternatives for the proposed anesthesia with the patient or authorized representative who has indicated his/her understanding and acceptance.     Plan Discussed with:   Anesthesia Plan Comments:         Anesthesia Quick Evaluation  

## 2014-08-08 LAB — CULTURE, BETA STREP (GROUP B ONLY)

## 2014-08-08 NOTE — Progress Notes (Signed)
Post Partum Day 1 Subjective: no complaints, up ad lib, voiding and tolerating PO  Objective: Blood pressure 127/68, pulse 73, temperature 98.3 F (36.8 C), temperature source Oral, resp. rate 18, height  (1.676 m), weight 226 lb (102.513 kg), SpO2 100 %, unknown if currently breastfeeding.  Physical Exam:  General: alert, cooperative, appears stated age and no distress Lochia: appropriate Uterine Fundus: firm Incision: healing well DVT Evaluation: No evidence of DVT seen on physical exam. Negative Homan's sign. No cords or calf tenderness.   Recent Labs  08/06/14 1503 08/07/14 0719  HGB 11.6* 11.3*  HCT 34.8* 34.5*    Assessment/Plan: Plan for discharge tomorrow   LOS: 2 days   Illa Enlow DARLENE 08/08/2014, 8:02 AM

## 2014-08-08 NOTE — Anesthesia Postprocedure Evaluation (Signed)
  Anesthesia Post-op Note  Patient: [name] [name]  Procedure(s) Performed: * No procedures listed *  Patient Location: Mother/Baby  Anesthesia Type:Epidural  Level of Consciousness: awake, alert , oriented and patient cooperative  Airway and Oxygen Therapy: Patient Spontanous Breathing  Post-op Pain: none  Post-op Assessment: Post-op Vital signs reviewed, Patient's Cardiovascular Status Stable, Respiratory Function Stable, Patent Airway, No headache, No backache and Patient able to bend at knees              Post-op Vital Signs: Reviewed and stable  Last Vitals:  Filed Vitals:   08/08/14 0608  BP: 127/68  Pulse: 73  Temp: 36.8 C  Resp: 18    Complications: No apparent anesthesia complications

## 2014-08-08 NOTE — Lactation Note (Signed)
This note was copied from the chart of [name] [name]. Lactation Consultation Note  Patient Name: [name] [name] Date: 08/08/2014 Reason for consult: Follow-up assessment;Breast/nipple pain;Difficult latch   Baby fussy, in crib sucking and spitting out a pacifier.  Mom looked overwhelmed, as it was very difficult and painful to latch baby to her breast, but baby getting more hungry.  Mom has very large, heavy breasts.  Baby 32 hrs old now.  Full assist given in football hold.  Baby has a very small mouth that opens and closes quickly.  When timed just right, baby was helped onto breast with sandwiching of areola.  Mom in much distress during latch.  Repeated again hoping to get a deeper latch, but Mom could not tolerate her pinching of her breast.  It was decided that baby should get supplemented with Alimentum 7-12 ml by slow flow nipple, and Mom pump both breasts regularly to help establish a milk supply.  Encouraged skin to skin as much as she can.  Mom seemed much more relaxed with this plan.  Talked about obtaining a pump from [facility] office, and if she is DC's on weekend, she could get a loaner for $30 deposit.  [facility] referral faxed.  Mom eating a meal, and will ask for assistance with pumping both breasts at same time.  Reassured her that with OP assistance, we could help her after discharge.  Mom very appropriate and appreciative.  To follow up in am.  Consult Status Consult Status: Follow-up Date: 08/09/14 Follow-up type: In-patient    [name] [name] 08/08/2014, 6:40 PM

## 2014-08-08 NOTE — Progress Notes (Signed)
CLINICAL SOCIAL WORK MATERNAL/CHILD NOTE  Patient Details  Name: [name] [name] MRN: [id number] Date of Birth: 08/07/2013  Date: 08/08/2014  Clinical Social Worker Initiating Note: Cavan Bearden, LCSWDate/ Time Initiated: 08/08/14/1330   Child's Name: [name] [name]   Legal Guardian: Mother   Need for Interpreter: None   Date of Referral: 08/07/14   Reason for Referral: Other (Comment)   Referral Source: [employer]   Address: [address] 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 Met with mother who was pleasant and receptive to social work. Mother reports that she was diagnosed with bipolar disorder around age 61 and hospitalized in a psychiatric facility. Informed that she also experimented with marijuana during this time. Mother states that she was prescribed medication, but they only made her sleepy and her PCP eventually d/c'd all psych meds. Mother also states that her PCP did not believe she was bipolar. Informed that she has been stable off all medication. She reports no other mental health crisis. Her affect and behavior were appropriate during CSW visit and she had a pleasant disposition. She denies any currently  symptoms of anxiety or depression. Mother notes that FOB is uninvolved. This didn't seem to have a significant effect on her. She did not provide any information about him. No acute social concerns noted or reported at this time. Mother informed of CSW availability.   CSW Plan/Description:    Provided information and resources on PP Depression No further intervention required No barriers to discharge  Floyd Lusignan J, LCSW 08/08/2014, 4:52 PM

## 2014-08-09 MED ORDER — OXYCODONE-ACETAMINOPHEN 5-325 MG PO TABS: 1.0000 | ORAL_TABLET | ORAL | Status: DC | PRN

## 2014-08-09 MED ORDER — IBUPROFEN 600 MG PO TABS: 600.0000 mg | ORAL_TABLET | Freq: Four times a day (QID) | ORAL | Status: DC

## 2014-08-09 NOTE — Lactation Note (Signed)
This note was copied from the chart of [name] [name]. Lactation Consultation Note  Patient Name: [name] [name] [id number] Date: 08/09/2014 Reason for consult: Follow-up assessmentwith this mom and term baby, weighing under 6 pounds and not able to latch. Mom has been pumping and at 51 hours post partum, is already expressing up to 35 mls. I loaned mom a DEP, and instructed her in it's use. I told mcerns until then.    Maternal Data    Feeding Feeding Type: Bottle Fed - Formula  LATCH Score/Interventions                      Lactation Tools Discussed/Used WIC Program: Yes Pump Review: Setup, frequency, and cleaning;Milk Storage   Consult Status Consult Status: Complete Date: 08/17/14 Follow-up type: Out-patient    Alfred Levins 08/09/2014, 1:50 PM

## 2014-08-09 NOTE — Discharge Summary (Signed)
Obstetric Discharge Summary Reason for Admission: onset of labor Prenatal Procedures: ultrasound Intrapartum Procedures: spontaneous vaginal delivery Postpartum Procedures: none Complications-Operative and Postpartum: none HEMOGLOBIN  Date Value Ref Range Status  08/07/2014 11.3* 12.0 - 15.0 g/dL Final   HCT  Date Value Ref Range Status  08/07/2014 34.5* 36.0 - 46.0 % Final    Physical Exam:  General: alert, cooperative, appears stated age and no distress Lochia: appropriate Uterine Fundus: firm Incision: n/a DVT Evaluation: No evidence of DVT seen on physical exam. Negative Homan's sign. No cords or calf tenderness.  Discharge Diagnoses: Term Pregnancy-delivered  Discharge Information: Date: 08/09/2014 Activity: pelvic rest Diet: routine Medications: None, Ibuprofen and Percocet Condition: stable and improved Instructions: refer to practice specific booklet Discharge to: home   Newborn Data: Live born female  Birth Weight: 6 lb 5.8 oz (2885 g) APGAR: 8, 9  Home with mother.  [name] [name] DARLENE 08/09/2014, 7:29 AM

## 2014-08-17 ENCOUNTER — Ambulatory Visit (HOSPITAL_COMMUNITY): Payer: Medicaid Other

## 2014-08-20 ENCOUNTER — Encounter (HOSPITAL_COMMUNITY): Payer: Self-pay | Admitting: *Deleted

## 2014-08-20 ENCOUNTER — Inpatient Hospital Stay (HOSPITAL_COMMUNITY)
Admission: AD | Admit: 2014-08-20 | Discharge: 2014-08-20 | Disposition: A | Discharge status: Home / Self Care | Payer: Medicaid Other | Source: Ambulatory Visit | Attending: Obstetrics & Gynecology | Admitting: Obstetrics & Gynecology

## 2014-08-20 ENCOUNTER — Encounter: Payer: Self-pay | Admitting: Obstetrics & Gynecology

## 2014-08-20 DIAGNOSIS — I158 Other secondary hypertension: Secondary | ICD-10-CM | POA: Insufficient documentation

## 2014-08-20 DIAGNOSIS — N939 Abnormal uterine and vaginal bleeding, unspecified: Secondary | ICD-10-CM | POA: Diagnosis not present

## 2014-08-20 DIAGNOSIS — Z87891 Personal history of nicotine dependence: Secondary | ICD-10-CM | POA: Diagnosis not present

## 2014-08-20 DIAGNOSIS — Z8759 Personal history of other complications of pregnancy, childbirth and the puerperium: Secondary | ICD-10-CM | POA: Diagnosis not present

## 2014-08-20 DIAGNOSIS — O9089 Other complications of the puerperium, not elsewhere classified: Secondary | ICD-10-CM | POA: Insufficient documentation

## 2014-08-20 DIAGNOSIS — I10 Essential (primary) hypertension: Secondary | ICD-10-CM

## 2014-08-20 LAB — URINE MICROSCOPIC-ADD ON

## 2014-08-20 LAB — URINALYSIS, ROUTINE W REFLEX MICROSCOPIC
Bilirubin Urine: NEGATIVE
Bilirubin Urine: NEGATIVE
GLUCOSE, UA: NEGATIVE mg/dL
Glucose, UA: NEGATIVE mg/dL
HGB URINE DIPSTICK: NEGATIVE
Ketones, ur: NEGATIVE mg/dL
Ketones, ur: NEGATIVE mg/dL
LEUKOCYTES UA: NEGATIVE
NITRITE: NEGATIVE
Nitrite: NEGATIVE
PH: 6 (ref 5.0–8.0)
Protein, ur: NEGATIVE mg/dL
Protein, ur: NEGATIVE mg/dL
SPECIFIC GRAVITY, URINE: 1.025 (ref 1.005–1.030)
Specific Gravity, Urine: 1.025 (ref 1.005–1.030)
UROBILINOGEN UA: 1 mg/dL (ref 0.0–1.0)
Urobilinogen, UA: 1 mg/dL (ref 0.0–1.0)
pH: 5.5 (ref 5.0–8.0)

## 2014-08-20 LAB — CBC
HCT: 36 % (ref 36.0–46.0)
HEMOGLOBIN: 11.8 g/dL — AB (ref 12.0–15.0)
MCH: 29.4 pg (ref 26.0–34.0)
MCHC: 32.8 g/dL (ref 30.0–36.0)
MCV: 89.6 fL (ref 78.0–100.0)
Platelets: 265 10*3/uL (ref 150–400)
RBC: 4.02 MIL/uL (ref 3.87–5.11)
RDW: 13.2 % (ref 11.5–15.5)
WBC: 6.3 10*3/uL (ref 4.0–10.5)

## 2014-08-20 LAB — COMPREHENSIVE METABOLIC PANEL
ALK PHOS: 101 U/L (ref 38–126)
ALT: 16 U/L (ref 14–54)
ANION GAP: 7 (ref 5–15)
AST: 18 U/L (ref 15–41)
Albumin: 3.5 g/dL (ref 3.5–5.0)
BILIRUBIN TOTAL: 0.7 mg/dL (ref 0.3–1.2)
BUN: 11 mg/dL (ref 6–20)
CHLORIDE: 107 mmol/L (ref 101–111)
CO2: 26 mmol/L (ref 22–32)
CREATININE: 0.7 mg/dL (ref 0.44–1.00)
Calcium: 9.4 mg/dL (ref 8.9–10.3)
GFR calc Af Amer: >60 mL/min (ref >60)
Glucose, Bld: 76 mg/dL (ref 65–99)
Potassium: 4.6 mmol/L (ref 3.5–5.1)
Sodium: 140 mmol/L (ref 135–145)
TOTAL PROTEIN: 6.6 g/dL (ref 6.5–8.1)

## 2014-08-20 LAB — PROTEIN / CREATININE RATIO, URINE
Creatinine, Urine: 176.59 mg/dL
PROTEIN CREATININE RATIO: 0.07 mg/mg{creat} (ref 0.00–0.15)
Total Protein, Urine: 13 mg/dL

## 2014-08-20 NOTE — Discharge Instructions (Signed)
Hypertension Hypertension is another name for high blood pressure. High blood pressure forces your heart to work harder to pump blood. A blood pressure reading has two numbers, which includes a higher number over a lower number (example: 110/72). HOME CARE   Have your blood pressure rechecked by your doctor.  Only take medicine as told by your doctor. Follow the directions carefully. The medicine does not work as well if you skip doses. Skipping doses also puts you at risk for problems.  Do not smoke.  Monitor your blood pressure at home as told by your doctor. GET HELP IF:  You think you are having a reaction to the medicine you are taking.  You have repeat headaches or feel dizzy.  You have puffiness (swelling) in your ankles.  You have trouble with your vision. GET HELP RIGHT AWAY IF:   You get a very bad headache and are confused.  You feel weak, numb, or faint.  You get chest or belly (abdominal) pain.  You throw up (vomit).  You cannot breathe very well. MAKE SURE YOU:   Understand these instructions.  Will watch your condition.  Will get help right away if you are not doing well or get worse. Document Released: 06/21/2007 Document Revised: 01/07/2013 Document Reviewed: 10/25/2012 ExitCare Patient Information 2015 [employer]. This information is not intended to replace advice given to you by your health care provider. Make sure you discuss any questions you have with your health care provider.  

## 2014-08-20 NOTE — MAU Note (Signed)
Pt presents complaining of passing clots last night that are bigger than a quarter. States she called the nurse line last night and they told her to come in because it sounds like it is her placenta. Hx of hypertension in pregnancy. Denies HA or visual changes.

## 2014-08-20 NOTE — MAU Provider Note (Signed)
History     CSN: [id number]  Arrival date and time: 08/20/14 1210   First Provider Initiated Contact with Patient 08/20/14 1418      Chief Complaint  Patient presents with  . Vaginal Bleeding   HPI  [name] is a 24 y.o. G1P1001 who delivered on 08/07/14 presents to MAU today with complaint of vaginal bleeding. She states that she has used 3 pads today and passed 2 large clots earlier today. She called the triage nurse and was told to come in for evaluation. She did have GHTN during her pregnancy and continues to have elevated BP today. She denies headache, blurred vision, floaters or peripheral edema. She also denies weakness or dizziness and feels that bleeding has decreased significantly this afternoon.   OB History    Gravida Para Term Preterm AB TAB SAB Ectopic Multiple Living   0 0 0 0 0 0 1      Past Medical History  Diagnosis Date  . Depression   . Suicidal overdose   . Bipolar 1 disorder     Past Surgical History  Procedure Laterality Date  . No past surgeries      Family History  Problem Relation Age of Onset  . Heart attack Mother   . Hypertension Father   . Stomach cancer Mother     History  Substance Use Topics  . Smoking status: Former Smoker -- 0.50 packs/day for 1 years    Types: Cigarettes  . Smokeless tobacco: Never Used  . Alcohol Use: 0.6 oz/week    1 Cans of beer per week     Comment: vodka 2-3 times weekly    Allergies: No Known Allergies  No prescriptions prior to admission    Review of Systems  Constitutional: Negative for fever and malaise/fatigue.  Gastrointestinal: Negative for abdominal pain.  Genitourinary:       + vaginal bleeding   Physical Exam   Blood pressure 140/94, pulse 89, temperature 98 F (36.7 C), temperature source Oral, resp. rate 18, unknown if currently breastfeeding.  Physical Exam  Nursing note and vitals reviewed. Constitutional: She is oriented to person, place, and time. She appears  well-developed and well-nourished. No distress.  HENT:  Head: Normocephalic and atraumatic.  Cardiovascular: Normal rate.   Respiratory: Effort normal.  GI: Soft. She exhibits no distension and no mass. There is no tenderness. There is no rebound and no guarding.  Musculoskeletal: She exhibits no edema.  Neurological: She is alert and oriented to person, place, and time.  Skin: Skin is warm and dry. No erythema.  Psychiatric: She has a normal mood and affect.   Results for orders placed or performed during the hospital encounter of 08/20/14 (from the past 24 hour(s))  Urinalysis, Routine w reflex microscopic (not at [facility])     Status: Abnormal   Collection Time: 08/20/14 12:21 PM  Result Value Ref Range   Color, Urine YELLOW YELLOW   APPearance CLEAR CLEAR   Specific Gravity, Urine 1.025 1.005 - 1.030   pH 5.5 5.0 - 8.0   Glucose, UA NEGATIVE NEGATIVE mg/dL   Hgb urine dipstick LARGE (A) NEGATIVE   Bilirubin Urine NEGATIVE NEGATIVE   Ketones, ur NEGATIVE NEGATIVE mg/dL   Protein, ur NEGATIVE NEGATIVE mg/dL   Urobilinogen, UA 1.0 0.0 - 1.0 mg/dL   Nitrite NEGATIVE NEGATIVE   Leukocytes, UA SMALL (A) NEGATIVE  Urine microscopic-add on     Status: None   Collection Time: 08/20/14 12:21 PM  Result Value Ref Range   Squamous Epithelial / LPF RARE RARE   WBC, UA 0-2 <3 WBC/hpf  CBC     Status: Abnormal   Collection Time: 08/20/14 12:45 PM  Result Value Ref Range   WBC 6.3 4.0 - 10.5 K/uL   RBC 4.02 3.87 - 5.11 MIL/uL   Hemoglobin 11.8 (L) 12.0 - 15.0 g/dL   HCT 16.1 09.6 - 04.5 %   MCV 89.6 78.0 - 100.0 fL   MCH 29.4 26.0 - 34.0 pg   MCHC 32.8 30.0 - 36.0 g/dL   RDW 40.9 81.1 - 91.4 %   Platelets 265 150 - 400 K/uL  Comprehensive metabolic panel     Status: None   Collection Time: 08/20/14 12:45 PM  Result Value Ref Range   Sodium 140 135 - 145 mmol/L   Potassium 4.6 3.5 - 5.1 mmol/L   Chloride 107 101 - 111 mmol/L   CO2 26 22 - 32 mmol/L   Glucose, Bld 76 65 - 99 mg/dL    BUN 11 6 - 20 mg/dL   Creatinine, Ser 7.82 0.44 - 1.00 mg/dL   Calcium 9.4 8.9 - 95.6 mg/dL   Total Protein 6.6 6.5 - 8.1 g/dL   Albumin 3.5 3.5 - 5.0 g/dL   AST 18 15 - 41 U/L   ALT 16 14 - 54 U/L   Alkaline Phosphatase 101 38 - 126 U/L   Total Bilirubin 0.7 0.3 - 1.2 mg/dL   GFR calc non Af Amer >60 >60 mL/min   GFR calc Af Amer >60 >60 mL/min   Anion gap 7 5 - 15  Protein / creatinine ratio, urine     Status: None   Collection Time: 08/20/14  2:35 PM  Result Value Ref Range   Creatinine, Urine 176.59 mg/dL   Total Protein, Urine 13 mg/dL   Protein Creatinine Ratio 0.07 0.00 - 0.15 mg/mg[Cre]  Urinalysis, Routine w reflex microscopic (not at [facility])     Status: None   Collection Time: 08/20/14  2:35 PM  Result Value Ref Range   Color, Urine YELLOW YELLOW   APPearance CLEAR CLEAR   Specific Gravity, Urine 1.025 1.005 - 1.030   pH 6.0 5.0 - 8.0   Glucose, UA NEGATIVE NEGATIVE mg/dL   Hgb urine dipstick NEGATIVE NEGATIVE   Bilirubin Urine NEGATIVE NEGATIVE   Ketones, ur NEGATIVE NEGATIVE mg/dL   Protein, ur NEGATIVE NEGATIVE mg/dL   Urobilinogen, UA 1.0 0.0 - 1.0 mg/dL   Nitrite NEGATIVE NEGATIVE   Leukocytes, UA NEGATIVE NEGATIVE   Patient Vitals for the past 24 hrs:  BP Temp Temp src Pulse Resp  08/20/14 1616 140/94 mmHg - - 89 -  08/20/14 1601 134/97 mmHg - - 81 -  08/20/14 1547 133/87 mmHg - - 82 -  08/20/14 1531 (!) 130/102 mmHg - - 75 -  08/20/14 1516 134/95 mmHg - - 73 -  08/20/14 1501 145/99 mmHg - - 67 -  08/20/14 1446 (!) 146/103 mmHg - - 66 -  08/20/14 1438 140/100 mmHg - - 67 -  08/20/14 1423 144/98 mmHg - - 62 -  08/20/14 1227 140/98 mmHg 98 F (36.7 C) Oral 86 18    MAU Course  Procedures None  MDM UA, CBC, CMP, Urine Protein/Creatinine ratio Dicussed patient with Dr. Erin Fulling. She would like patient to follow-up in [facility] for BP check in 2 weeks. Ok with plan for discharge at this time. No Rx needed Assessment and Plan  A: Gestational HTN,  delivered Vaginal bleeding  P: Discharge home Continue Ibuprofen PRN for pain Bleeding/pre-eclampsia precautions discussed Patient advised to follow-up with [facility] in 2 weeks for BP check and as scheduled for routine PP visit Patient may return to MAU as needed or if her condition were to change or worsen   Marny Lowenstein, PA-C  08/20/2014, 4:31 PM

## 2014-09-23 ENCOUNTER — Ambulatory Visit: Payer: Self-pay | Admitting: Obstetrics & Gynecology

## 2015-05-29 ENCOUNTER — Encounter (HOSPITAL_COMMUNITY): Payer: Self-pay | Admitting: Emergency Medicine

## 2015-05-29 ENCOUNTER — Emergency Department (HOSPITAL_COMMUNITY)
Admission: EM | Admit: 2015-05-29 | Discharge: 2015-05-29 | Disposition: A | Discharge status: Home / Self Care | Payer: Medicaid Other | Attending: Emergency Medicine | Admitting: Emergency Medicine

## 2015-05-29 DIAGNOSIS — Z791 Long term (current) use of non-steroidal anti-inflammatories (NSAID): Secondary | ICD-10-CM | POA: Insufficient documentation

## 2015-05-29 DIAGNOSIS — Z87891 Personal history of nicotine dependence: Secondary | ICD-10-CM | POA: Insufficient documentation

## 2015-05-29 DIAGNOSIS — H9203 Otalgia, bilateral: Secondary | ICD-10-CM | POA: Insufficient documentation

## 2015-05-29 DIAGNOSIS — J020 Streptococcal pharyngitis: Secondary | ICD-10-CM | POA: Insufficient documentation

## 2015-05-29 DIAGNOSIS — Z8659 Personal history of other mental and behavioral disorders: Secondary | ICD-10-CM | POA: Insufficient documentation

## 2015-05-29 DIAGNOSIS — R000 Tachycardia, unspecified: Secondary | ICD-10-CM | POA: Insufficient documentation

## 2015-05-29 DIAGNOSIS — Z79899 Other long term (current) drug therapy: Secondary | ICD-10-CM | POA: Insufficient documentation

## 2015-05-29 LAB — RAPID STREP SCREEN (MED CTR MEBANE ONLY): STREPTOCOCCUS, GROUP A SCREEN (DIRECT): POSITIVE — AB

## 2015-05-29 MED ORDER — HYDROCODONE-ACETAMINOPHEN 7.5-325 MG/15ML PO SOLN: 10.0000 mL | Freq: Four times a day (QID) | ORAL | Status: DC | PRN

## 2015-05-29 MED ORDER — PENICILLIN G BENZATHINE 1200000 UNIT/2ML IM SUSP
1.2000 10*6.[IU] | Freq: Once | INTRAMUSCULAR | Status: AC
  Administered 2015-05-29: 1.2 10*6.[IU] via INTRAMUSCULAR
  Filled 2015-05-29: qty 2

## 2015-05-29 NOTE — ED Notes (Signed)
Pt c.o  Sore throat and cough x 3 days,

## 2015-05-29 NOTE — ED Notes (Signed)
Sore throat and cough x 3 days.  Pt states she had temp 101.5 yesterday.  Today looked in throat and saw spots on her tonsils.

## 2015-05-29 NOTE — Discharge Instructions (Signed)
Strep Throat °Strep throat is an infection of the throat. It is caused by germs. Strep throat spreads from person to person because of coughing, sneezing, or close contact. °HOME CARE °Medicines  °· Take over-the-counter and prescription medicines only as told by your doctor. °· Take your antibiotic medicine as told by your doctor. Do not stop taking the medicine even if you feel better. °· Have family members who also have a sore throat or fever go to a doctor. °Eating and Drinking  °· Do not share food, drinking cups, or personal items. °· Try eating soft foods until your sore throat feels better. °· Drink enough fluid to keep your pee (urine) clear or pale yellow. °General Instructions °· Rinse your mouth (gargle) with a salt-water mixture 3-4 times per day or as needed. To make a salt-water mixture, stir ½-1 tsp of salt into 1 cup of warm water. °· Make sure that all people in your house wash their hands well. °· Rest. °· Stay home from school or work until you have been taking antibiotics for 24 hours. °· Keep all follow-up visits as told by your doctor. This is important. °GET HELP IF: °· Your neck keeps getting bigger. °· You get a rash, cough, or earache. °· You cough up thick liquid that is green, yellow-brown, or bloody. °· You have pain that does not get better with medicine. °· Your problems get worse instead of getting better. °· You have a fever. °GET HELP RIGHT AWAY IF: °· You throw up (vomit). °· You get a very bad headache. °· You neck hurts or it feels stiff. °· You have chest pain or you are short of breath. °· You have drooling, very bad throat pain, or changes in your voice. °· Your neck is swollen or the skin gets red and tender. °· Your mouth is dry or you are peeing less than normal. °· You keep feeling more tired or it is hard to wake up. °· Your joints are red or they hurt. °  °This information is not intended to replace advice given to you by your health care provider. Make sure you  discuss any questions you have with your health care provider. °  °Document Released: 06/21/2007 Document Revised: 09/23/2014 Document Reviewed: 04/27/2014 °Elsevier Interactive Patient Education ©2016 [employer] ° °

## 2015-05-29 NOTE — ED Provider Notes (Signed)
CSN: [id number]     Arrival date & time 05/29/15  1818 History  By signing my name below, I, [name] DarnerRussell [name], attest that this documentation has been prepared under the direction and in the presence of non-physician practitioner, [name] HelperBowie Jaculin Rasmus, PA-C. Electronically Signed: Linna Darnerussell [name], Scribe. 05/29/2015. 6:37 PM.   Chief Complaint  Patient presents with  . Sore Throat  . Cough    The history is provided by the patient. No language interpreter was used.     HPI Comments: [name] CoombesJustina [name] is a 25 y.o. female with h/o bronchitis who presents to the Emergency Department complaining of sudden onset, constant, severe, aching, worsening, sore throat for the last three days. She states that her throat is very swollen and she has noticed yellow spots on her throat since onset. Pt endorses associated pain with swallowing, rhinorrhea, cough, bilateral ear pain, fever, and chest pain with inhalation. She measured a fever yesterday of 101.5. She endorses throat pain exacerbation with palpation to her neck. Pt has tried thermaflu with no relief. Pt has no h/o strep throat. She has not been around anyone with similar symptoms. Pt is a smoker. She is not allergic to any medications. She denies abdominal pain or any other associated symptoms.  Past Medical History  Diagnosis Date  . Depression   . Suicidal overdose (HCC)   . Bipolar 1 disorder [facility](HCC)    Past Surgical History  Procedure Laterality Date  . No past surgeries     Family History  Problem Relation Age of Onset  . Heart attack Mother   . Hypertension Father   . Stomach cancer Mother    Social History  Substance Use Topics  . Smoking status: Former Smoker -- 0.50 packs/day for 1 years    Types: Cigarettes  . Smokeless tobacco: Never Used  . Alcohol Use: 0.6 oz/week    1 Cans of beer per week     Comment: vodka 2-3 times weekly   OB History    Gravida Para Term Preterm AB TAB SAB Ectopic Multiple Living   1 1 1  0 0 0 0 0 0 1      Review of Systems  Constitutional: Positive for fever.  HENT: Positive for ear pain, rhinorrhea and sore throat.        Positive for throat swelling.  Respiratory: Positive for cough.   Cardiovascular: Positive for chest pain.  Gastrointestinal: Negative for abdominal pain.    Allergies  Review of patient's allergies indicates no known allergies.  Home Medications   Prior to Admission medications   Medication Sig Start Date End Date Taking? Authorizing Provider  albuterol (PROVENTIL HFA;VENTOLIN HFA) 108 (90 BASE) MCG/ACT inhaler Inhale 2 puffs into the lungs every 6 (six) hours as needed. For wheezing/shortness of breath    Historical Provider, MD  ibuprofen (ADVIL,MOTRIN) 600 MG tablet Take 1 tablet (600 mg total) by mouth every 6 (six) hours. 08/09/14   Montez MoritaMarie D Lawson, CNM  Prenatal Vit-Fe Fumarate-FA (PRENATAL MULTIVITAMIN) TABS tablet Take 1 tablet by mouth daily at 12 noon.    Historical Provider, MD   BP 125/90 mmHg  Pulse 106  Temp(Src) 99.1 F (37.3 C) (Oral)  SpO2 99%  LMP 05/07/2015 Physical Exam  Constitutional: She is oriented to person, place, and time. She appears well-developed and well-nourished. No distress.  HENT:  Head: Normocephalic and atraumatic.  Mouth/Throat: Uvula is midline. No trismus in the jaw. Posterior oropharyngeal erythema present.  Post oropharyngeal erythema involving the soft pallet with bilateral  tonsil enlargement and exudate.  Eyes: Conjunctivae and EOM are normal. Pupils are equal, round, and reactive to light. No scleral icterus.  Neck: Neck supple. No tracheal deviation present.  Cardiovascular: Regular rhythm.  Tachycardia present.   Mild tachycardia. No murmurs, rubs, or gallops.  Pulmonary/Chest: Effort normal and breath sounds normal. No respiratory distress.  Lungs clear to auscultation bilaterally.  Musculoskeletal: Normal range of motion.  Lymphadenopathy:    She has no cervical adenopathy.  Neurological: She is alert and  oriented to person, place, and time.  Skin: Skin is warm and dry.  Psychiatric: She has a normal mood and affect. Her behavior is normal.  Nursing note and vitals reviewed.   ED Course  Procedures (including critical care time)  DIAGNOSTIC STUDIES: Oxygen Saturation is 99% on RA, normal by my interpretation.    COORDINATION OF CARE: 6:37 PM Discussed treatment plan with pt at bedside and pt agreed to plan.  Labs Review Labs Reviewed  RAPID STREP SCREEN (NOT AT [facility]) - Abnormal; Notable for the following:    Streptococcus, Group A Screen (Direct) POSITIVE (*)    All other components within normal limits   I have personally reviewed and evaluated these lab results as part of my medical decision-making.  MDM   Final diagnoses:  Strep pharyngitis    BP 125/90 mmHg  Pulse 106  Temp(Src) 99.1 F (37.3 C) (Oral)  SpO2 99%  LMP 05/07/2015  I personally performed the services described in this documentation, which was scribed in my presence. The recorded information has been reviewed and is accurate.     7:59 PM Pt here with sore throat.  Strep test positive.  Will treat with bicillin.  Will provide sxs treatment.  No evidence of PTA or deep tissue infection.   [name] Helper, PA-C 05/29/15 2002  Melene Plan, DO 05/29/15 2344

## 2015-05-29 NOTE — ED Notes (Signed)
Pt ambulates independently and with steady gait at time of discharge. Discharge instructions and follow up information reviewed with patient. No other questions or concerns voiced at this time.  

## 2015-06-04 ENCOUNTER — Emergency Department (HOSPITAL_COMMUNITY): Payer: Medicaid Other

## 2015-06-04 ENCOUNTER — Encounter (HOSPITAL_COMMUNITY): Payer: Self-pay | Admitting: Emergency Medicine

## 2015-06-04 ENCOUNTER — Emergency Department (HOSPITAL_COMMUNITY)
Admission: EM | Admit: 2015-06-04 | Discharge: 2015-06-04 | Disposition: A | Discharge status: Home / Self Care | Payer: Medicaid Other | Attending: Emergency Medicine | Admitting: Emergency Medicine

## 2015-06-04 DIAGNOSIS — H9202 Otalgia, left ear: Secondary | ICD-10-CM | POA: Insufficient documentation

## 2015-06-04 DIAGNOSIS — Z87891 Personal history of nicotine dependence: Secondary | ICD-10-CM | POA: Insufficient documentation

## 2015-06-04 DIAGNOSIS — Z8659 Personal history of other mental and behavioral disorders: Secondary | ICD-10-CM | POA: Insufficient documentation

## 2015-06-04 DIAGNOSIS — J36 Peritonsillar abscess: Secondary | ICD-10-CM | POA: Insufficient documentation

## 2015-06-04 DIAGNOSIS — Z79899 Other long term (current) drug therapy: Secondary | ICD-10-CM | POA: Insufficient documentation

## 2015-06-04 LAB — CBC WITH DIFFERENTIAL/PLATELET
Basophils Absolute: 0 10*3/uL (ref 0.0–0.1)
Basophils Relative: 0 %
EOS ABS: 0.2 10*3/uL (ref 0.0–0.7)
Eosinophils Relative: 3 %
HEMATOCRIT: 35.9 % — AB (ref 36.0–46.0)
Hemoglobin: 11.5 g/dL — ABNORMAL LOW (ref 12.0–15.0)
Lymphocytes Relative: 14 %
Lymphs Abs: 1.4 10*3/uL (ref 0.7–4.0)
MCH: 28.1 pg (ref 26.0–34.0)
MCHC: 32 g/dL (ref 30.0–36.0)
MCV: 87.8 fL (ref 78.0–100.0)
MONO ABS: 0.4 10*3/uL (ref 0.1–1.0)
MONOS PCT: 5 %
NEUTROS ABS: 7.6 10*3/uL (ref 1.7–7.7)
Neutrophils Relative %: 78 %
Platelets: 315 10*3/uL (ref 150–400)
RBC: 4.09 MIL/uL (ref 3.87–5.11)
RDW: 12.9 % (ref 11.5–15.5)
WBC: 9.7 10*3/uL (ref 4.0–10.5)

## 2015-06-04 LAB — RAPID STREP SCREEN (MED CTR MEBANE ONLY): Streptococcus, Group A Screen (Direct): NEGATIVE

## 2015-06-04 LAB — I-STAT CHEM 8, ED
BUN: 10 mg/dL (ref 6–20)
CREATININE: 0.7 mg/dL (ref 0.44–1.00)
Calcium, Ion: 1.16 mmol/L (ref 1.12–1.23)
Chloride: 103 mmol/L (ref 101–111)
GLUCOSE: 89 mg/dL (ref 65–99)
HCT: 39 % (ref 36.0–46.0)
HEMOGLOBIN: 13.3 g/dL (ref 12.0–15.0)
POTASSIUM: 4.4 mmol/L (ref 3.5–5.1)
Sodium: 138 mmol/L (ref 135–145)
TCO2: 25 mmol/L (ref 0–100)

## 2015-06-04 MED ORDER — IOPAMIDOL (ISOVUE-300) INJECTION 61%
INTRAVENOUS | Status: AC
  Administered 2015-06-04: 75 mL
  Filled 2015-06-04: qty 75

## 2015-06-04 MED ORDER — HYDROCODONE-ACETAMINOPHEN 7.5-325 MG/15ML PO SOLN: 15.0000 mL | Freq: Three times a day (TID) | ORAL | Status: DC | PRN

## 2015-06-04 MED ORDER — HYDROCODONE-ACETAMINOPHEN 7.5-325 MG/15ML PO SOLN
10.0000 mL | Freq: Once | ORAL | Status: AC
  Administered 2015-06-04: 10 mL via ORAL
  Filled 2015-06-04: qty 15

## 2015-06-04 MED ORDER — DEXAMETHASONE SODIUM PHOSPHATE 10 MG/ML IJ SOLN
10.0000 mg | Freq: Once | INTRAMUSCULAR | Status: AC
  Administered 2015-06-04: 10 mg via INTRAVENOUS
  Filled 2015-06-04: qty 1

## 2015-06-04 MED ORDER — CLINDAMYCIN PHOSPHATE 600 MG/50ML IV SOLN
600.0000 mg | Freq: Once | INTRAVENOUS | Status: AC
  Administered 2015-06-04: 600 mg via INTRAVENOUS
  Filled 2015-06-04: qty 50

## 2015-06-04 NOTE — ED Provider Notes (Signed)
CSN: [id number]     Arrival date & time 06/04/15  1118 History  By signing my name below, I, Essence Howell, attest that this documentation has been prepared under the direction and in the presence of Sharilyn Sites, PA-C Electronically Signed: Charline Bills, ED Scribe 06/04/2015 at 2:45 PM.   Chief Complaint  Patient presents with  . Sore Throat   The history is provided by the patient. No language interpreter was used.   HPI Comments: [name] [name] is a 25 y.o. female who presents to the Emergency Department complaining of persistent sore throat for the past week. Pt was seen in the ED 6 days ago for the same, tested positive for streptococcus and treated with bicillin. Today, she reports worsened left-sided sore throat that is exacerbated with swallowing. Pt reports an associated symptom of left ear pain for the past few days. She denies fever and emesis. No known drug allergies.   Past Medical History  Diagnosis Date  . Depression   . Suicidal overdose (HCC)   . Bipolar 1 disorder [facility])    Past Surgical History  Procedure Laterality Date  . No past surgeries     Family History  Problem Relation Age of Onset  . Heart attack Mother   . Hypertension Father   . Stomach cancer Mother    Social History  Substance Use Topics  . Smoking status: Former Smoker -- 0.50 packs/day for 1 years    Types: Cigarettes  . Smokeless tobacco: Never Used  . Alcohol Use: 0.6 oz/week    1 Cans of beer per week     Comment: vodka 2-3 times weekly   OB History    Gravida Para Term Preterm AB TAB SAB Ectopic Multiple Living   0 0 0 0 0 0 1     Review of Systems  Constitutional: Negative for fever.  HENT: Positive for sore throat.   Gastrointestinal: Negative for vomiting.  All other systems reviewed and are negative.  Allergies  Review of patient's allergies indicates no known allergies.  Home Medications   Prior to Admission medications   Medication Sig Start Date End Date  Taking? Authorizing Provider  albuterol (PROVENTIL HFA;VENTOLIN HFA) 108 (90 BASE) MCG/ACT inhaler Inhale 2 puffs into the lungs every 6 (six) hours as needed. For wheezing/shortness of breath    Historical Provider, MD  HYDROcodone-acetaminophen (HYCET) 7.5-325 mg/15 ml solution Take 10 mLs by mouth every 6 (six) hours as needed for moderate pain or severe pain. 05/29/15   Fayrene Helper, PA-C  ibuprofen (ADVIL,MOTRIN) 600 MG tablet Take 1 tablet (600 mg total) by mouth every 6 (six) hours. 08/09/14   Montez Morita, CNM  Prenatal Vit-Fe Fumarate-FA (PRENATAL MULTIVITAMIN) TABS tablet Take 1 tablet by mouth daily at 12 noon.    Historical Provider, MD   BP 133/65 mmHg  Pulse 109  Temp(Src) 98.7 F (37.1 C) (Oral)  Resp 20  SpO2 100%  LMP 05/07/2015 Physical Exam  Constitutional: She is oriented to person, place, and time. She appears well-developed and well-nourished.  HENT:  Head: Normocephalic and atraumatic.  Mouth/Throat: Oropharynx is clear and moist.  Asymmetric tonsillar edema L > R; uvula appears in the midline; handling secretions well; normal phonation without stridor; + cervical lymphadenopathy on left; no facial or neck swelling  Eyes: Conjunctivae and EOM are normal. Pupils are equal, round, and reactive to light.  Neck: Normal range of motion.  Cardiovascular: Normal rate, regular rhythm and normal heart sounds.  Pulmonary/Chest: Effort normal and breath sounds normal.  Abdominal: Soft. Bowel sounds are normal.  Musculoskeletal: Normal range of motion.  Neurological: She is alert and oriented to person, place, and time.  Skin: Skin is warm and dry.  Psychiatric: She has a normal mood and affect.  Nursing note and vitals reviewed.  ED Course  Procedures (including critical care time) DIAGNOSTIC STUDIES: Oxygen Saturation is 100% on RA, normal by my interpretation.    COORDINATION OF CARE: 11:41 AM-Discussed treatment plan which includes strep screen, CBC, I-stat, CT neck,  IV, Hycetwith pt at bedside and pt agreed to plan.   Labs Review Labs Reviewed  CBC WITH DIFFERENTIAL/PLATELET - Abnormal; Notable for the following:    Hemoglobin 11.5 (*)    HCT 35.9 (*)    All other components within normal limits  RAPID STREP SCREEN (NOT AT [facility])  CULTURE, GROUP A STREP ([facility])  I-STAT CHEM 8, ED   Imaging Review Ct Soft Tissue Neck W Contrast  06/04/2015  CLINICAL DATA:  25 year old female with sore throat with pain and difficulty swallowing for 1 week following diagnosis of Streptococcus infection. Initial encounter. EXAM: CT NECK WITH CONTRAST TECHNIQUE: Multidetector CT imaging of the neck was performed using the standard protocol following the bolus administration of intravenous contrast. CONTRAST:  75mL ISOVUE-300 IOPAMIDOL (ISOVUE-300) INJECTION 61% COMPARISON:  CT face and cervical spine 06/15/2012. FINDINGS: Pharynx and larynx: Negative larynx. Asymmetric soft tissue swelling throughout the left lateral laryngeal wall extending from the soft palate to the left piriform sinus. Effacement of the left piriform sinus with thickening of the left 80 fold. No definite involvement of the epiglottis. The left palatine tonsil is asymmetrically enlarged, and there is a small 8 mm area of hypodensity within the lower aspect of the tonsil (best seen on coronal image 47). There is superimposed symmetric appearing adenoid hypertrophy and enhancement. There is mild left parapharyngeal space stranding. There is no retropharyngeal space fluid or abscess. Negative right parapharyngeal space. Salivary glands: Negative sublingual space. Submandibular glands and parotid glands are within normal limits. Thyroid: Negative. Lymph nodes: Left retropharyngeal lymphadenopathy, with enlarged and hyper enhancing 8 mm node on series 2, image 21. Similar asymmetric enlargement and hyper enhancement of the level IIa lymph nodes greater on the left (up to 12 mm short axis). Other bilateral lymph node  stations appear more stable since 2014. No cystic or necrotic nodes. Vascular: Major vascular structures in the neck and at the skullbase are patent including the left internal jugular vein. Limited intracranial: Negative. Visualized orbits: Negative. Mastoids and visualized paranasal sinuses: Clear. Skeleton: Poor dentition left posterior mandible molar (series 4, image 50), has significantly progressed since 2014. Occasional carious dentition elsewhere. No other osseous abnormality. Upper chest: Negative visualized superior mediastinum, including small volume residual thymus. Negative lung apices. IMPRESSION: 1. Widespread left pharyngeal edema extending from the soft palate to the piriform sinus. Evidence of a small 8 mm intra tonsillar abscess along the caudal aspect of the left palatine tonsil (see coronal image 47). No other soft tissue abscess or fluid collection in the neck. 2. Reactive left retropharyngeal and level 2 lymphadenopathy. No cystic or necrotic nodes. 3. Poor dentition posterior left mandible molar. Consider an odontogenic source of infection. Electronically Signed   By: Odessa FlemingH  Hall M.D.   On: 06/04/2015 14:35   I have personally reviewed and evaluated these images and lab results as part of my medical decision-making.   EKG Interpretation None      MDM  Final diagnoses:  Peritonsillar abscess   25 year old female here with sore throat. She was seen in the ED last week had a positive strep test and was treated with Bicillin in the ED. She reports initially she was feeling better, but now has worsening sore throat the left side of her neck. She is not having any difficulty handling her secretions on exam. Her airway is widely patent. She does have asymmetric tonsillar edema, left > right.  Concern for peritonsillar abscess. Will send labs, obtain CT soft tissue neck for further evaluation.  CT does reveal 8mm intratonsillar abscess of the left palatine tonsil. She also has reactive  lymphadenopathy as expected. Will discuss with on call ENT for recommendations. She continues handling secretions well, has tolerated ice chips without difficulty.  3:13 PM Case discussed with on call ENT, Dr. Jenne Pane-- recommends IV clindamycin and decadron now, go to clinic afterwards so he can evaluate.  This plan discussed with patient, in agreement with this.  3:52 PM Antibiotics finished at this time.  Will d/c to go over to ENT clinic.  I personally performed the services described in this documentation, which was scribed in my presence. The recorded information has been reviewed and is accurate.  Garlon Hatchet, PA-C 06/04/15 1553  Mancel Bale, MD 06/04/15 2028

## 2015-06-04 NOTE — ED Notes (Signed)
Pt treated last week for Strep. Returns today with throat pain and swelling, difficulty swallowing.

## 2015-06-04 NOTE — ED Notes (Signed)
Patient able to ambulate independently  

## 2015-06-04 NOTE — Discharge Instructions (Signed)
Take the prescribed medication as directed.  Use caution, this can make you sleepy/drowsy. Follow-up with ENT, Dr. Jenne PaneBates-- go to his office as soon as you leave the ED. Return to the ED for new or worsening symptoms.

## 2015-06-06 LAB — CULTURE, GROUP A STREP (THRC)

## 2015-09-10 ENCOUNTER — Emergency Department (HOSPITAL_COMMUNITY): Payer: Self-pay

## 2015-09-10 ENCOUNTER — Emergency Department (HOSPITAL_COMMUNITY)
Admission: EM | Admit: 2015-09-10 | Discharge: 2015-09-10 | Disposition: A | Discharge status: Home / Self Care | Payer: Self-pay | Attending: Emergency Medicine | Admitting: Emergency Medicine

## 2015-09-10 ENCOUNTER — Encounter (HOSPITAL_COMMUNITY): Payer: Self-pay

## 2015-09-10 DIAGNOSIS — A09 Infectious gastroenteritis and colitis, unspecified: Secondary | ICD-10-CM | POA: Insufficient documentation

## 2015-09-10 DIAGNOSIS — Z87891 Personal history of nicotine dependence: Secondary | ICD-10-CM | POA: Insufficient documentation

## 2015-09-10 LAB — CBC
HEMATOCRIT: 37.4 % (ref 36.0–46.0)
HEMOGLOBIN: 12 g/dL (ref 12.0–15.0)
MCH: 28.4 pg (ref 26.0–34.0)
MCHC: 32.1 g/dL (ref 30.0–36.0)
MCV: 88.4 fL (ref 78.0–100.0)
Platelets: 286 10*3/uL (ref 150–400)
RBC: 4.23 MIL/uL (ref 3.87–5.11)
RDW: 13.1 % (ref 11.5–15.5)
WBC: 6.3 10*3/uL (ref 4.0–10.5)

## 2015-09-10 LAB — URINALYSIS, ROUTINE W REFLEX MICROSCOPIC
Bilirubin Urine: NEGATIVE
GLUCOSE, UA: NEGATIVE mg/dL
HGB URINE DIPSTICK: NEGATIVE
Ketones, ur: NEGATIVE mg/dL
Leukocytes, UA: NEGATIVE
Nitrite: NEGATIVE
Protein, ur: NEGATIVE mg/dL
SPECIFIC GRAVITY, URINE: 1.021 (ref 1.005–1.030)
pH: 5.5 (ref 5.0–8.0)

## 2015-09-10 LAB — COMPREHENSIVE METABOLIC PANEL
ALT: 24 U/L (ref 14–54)
ANION GAP: 5 (ref 5–15)
AST: 19 U/L (ref 15–41)
Albumin: 4 g/dL (ref 3.5–5.0)
Alkaline Phosphatase: 54 U/L (ref 38–126)
BUN: 9 mg/dL (ref 6–20)
CHLORIDE: 107 mmol/L (ref 101–111)
CO2: 23 mmol/L (ref 22–32)
CREATININE: 0.65 mg/dL (ref 0.44–1.00)
Calcium: 9.3 mg/dL (ref 8.9–10.3)
GFR calc non Af Amer: >60 mL/min (ref >60)
Glucose, Bld: 93 mg/dL (ref 65–99)
Potassium: 4.2 mmol/L (ref 3.5–5.1)
SODIUM: 135 mmol/L (ref 135–145)
Total Bilirubin: 0.6 mg/dL (ref 0.3–1.2)
Total Protein: 7.4 g/dL (ref 6.5–8.1)

## 2015-09-10 LAB — LIPASE, BLOOD: Lipase: 21 U/L (ref 11–51)

## 2015-09-10 LAB — POC URINE PREG, ED: Preg Test, Ur: NEGATIVE

## 2015-09-10 MED ORDER — PROMETHAZINE HCL 25 MG PO TABS: 25.0000 mg | ORAL_TABLET | Freq: Four times a day (QID) | ORAL | 0 refills | Status: DC | PRN

## 2015-09-10 MED ORDER — LOPERAMIDE HCL 2 MG PO CAPS: 2.0000 mg | ORAL_CAPSULE | Freq: Four times a day (QID) | ORAL | 0 refills | Status: DC | PRN

## 2015-09-10 NOTE — ED Notes (Signed)
Pt not in room.

## 2015-09-10 NOTE — Discharge Instructions (Signed)
Follow-up with a primary care doctor in 1 week if symptoms persist

## 2015-09-10 NOTE — ED Triage Notes (Signed)
Pt reports she has hd diarrhea X1 week. Only one episode today. Pt reports she was nauseous but denies vomiting. Pt reports decreased appetite. Pt is due for menstrual cycle.

## 2015-09-10 NOTE — ED Notes (Addendum)
Pt back in room from xray. No distress observed. 

## 2015-09-10 NOTE — ED Provider Notes (Signed)
[facility]-EM[facility]ENCY DEPT Provider Note   CSN: [id number] Arrival date & time: 09/10/15  1322     History   Chief Complaint Chief Complaint  Patient presents with  . Diarrhea    HPI [name] [name] is a 25 y.o. female.  HPI Pt started having diarrhea one week ago.  She had one episode of diarrhea today but none since.  However she has not eaten much because whenever she eats it triggers cramping and loose stool.  No blood.  No antibiotics recently.  No travel recently.  No one else is sick at home.  No blood in stool.  No vomiting Today she also developed pain on the left side of her chest.  She thinks it is her heart.  It goes to her back.  She has had some coughing.  No hx of dvt or PE.  Past Medical History:  Diagnosis Date  . Bipolar 1 disorder (HCC)   . Depression   . Suicidal overdose [facility])     Patient Active Problem List   Diagnosis Date Noted  . Hypertension affecting pregnancy 08/06/2014  . Pregnancy induced hypertension, antepartum 08/06/2014  . Environmental allergies 05/28/2014  . UTI (urinary tract infection) in pregnancy in second trimester 03/08/2014  . Supervision of normal pregnancy 03/04/2014  . Depression 04/29/2012    Past Surgical History:  Procedure Laterality Date  . NO PAST SURGERIES      OB History    Gravida Para Term Preterm AB Living   2 1 1  0 0 1   SAB TAB Ectopic Multiple Live Births   0 0 0 0 1       Home Medications    Prior to Admission medications   Medication Sig Start Date End Date Taking? Authorizing Provider  albuterol (PROVENTIL HFA;VENTOLIN HFA) 108 (90 BASE) MCG/ACT inhaler Inhale 2 puffs into the lungs every 6 (six) hours as needed. For wheezing/shortness of breath    Historical Provider, MD  HYDROcodone-acetaminophen (HYCET) 7.5-325 mg/15 ml solution Take 15 mLs by mouth every 8 (eight) hours as needed for moderate pain. 06/04/15   Garlon Hatchet, PA-C  ibuprofen (ADVIL,MOTRIN) 600 MG tablet Take 1 tablet (600 mg total) by  mouth every 6 (six) hours. 08/09/14   Montez Morita, CNM  loperamide (IMODIUM) 2 MG capsule Take 1 capsule (2 mg total) by mouth 4 (four) times daily as needed for diarrhea or loose stools. 09/10/15   Linwood Dibbles, MD  Prenatal Vit-Fe Fumarate-FA (PRENATAL MULTIVITAMIN) TABS tablet Take 1 tablet by mouth daily at 12 noon.    Historical Provider, MD  promethazine (PHENERGAN) 25 MG tablet Take 1 tablet (25 mg total) by mouth every 6 (six) hours as needed for nausea or vomiting. 09/10/15   Linwood Dibbles, MD    Family History Family History  Problem Relation Age of Onset  . Heart attack Mother   . Stomach cancer Mother   . Hypertension Father     Social History Social History  Substance Use Topics  . Smoking status: Former Smoker    Packs/day: 0.50    Years: 1.00    Types: Cigarettes  . Smokeless tobacco: Never Used  . Alcohol use 0.6 oz/week    1 Cans of beer per week     Comment: vodka 2-3 times weekly     Allergies   Review of patient's allergies indicates no known allergies.   Review of Systems Review of Systems  All other systems reviewed and are negative.    Physical  Exam Updated Vital Signs BP 125/95   Pulse 73   Temp 98.2 F (36.8 C) (Oral)   Resp 18   Ht 5\' 6"  (1.676 m)   Wt 97.5 kg   LMP 05/07/2015   SpO2 100%   BMI 34.70 kg/m   Physical Exam  Constitutional: She appears well-developed and well-nourished. No distress.  HENT:  Head: Normocephalic and atraumatic.  Right Ear: External ear normal.  Left Ear: External ear normal.  Eyes: Conjunctivae are normal. Right eye exhibits no discharge. Left eye exhibits no discharge. No scleral icterus.  Neck: Neck supple. No tracheal deviation present.  Cardiovascular: Normal rate, regular rhythm and intact distal pulses.   Pulmonary/Chest: Effort normal and breath sounds normal. No stridor. No respiratory distress. She has no wheezes. She has no rales.  Abdominal: Soft. Bowel sounds are normal. She exhibits no  distension. There is no tenderness. There is no rebound and no guarding.  Musculoskeletal: She exhibits no edema or tenderness.  Neurological: She is alert. She has normal strength. No cranial nerve deficit (no facial droop, extraocular movements intact, no slurred speech) or sensory deficit. She exhibits normal muscle tone. She displays no seizure activity. Coordination normal.  Skin: Skin is warm and dry. No rash noted.  Psychiatric: She has a normal mood and affect.  Nursing note and vitals reviewed.    ED Treatments / Results  Labs (all labs ordered are listed, but only abnormal results are displayed) Labs Reviewed  LIPASE, BLOOD  COMPREHENSIVE METABOLIC PANEL  CBC  URINALYSIS, ROUTINE W REFLEX [facility]CROSCOPIC (NOT [facility])  POC URINE PREG, ED    EKG  EKG Interpretation  Date/Time:  Friday September 10 2015 19:40:16 EDT Ventricular Rate:  63 PR Interval:    QRS Duration: 91 QT Interval:  414 QTC Calculation: 424 R Axis:   63 Text Interpretation:  Sinus rhythm Borderline T abnormalities, anterior leads Since last tracing rate slower Confirmed by Brycelynn Stampley  MD-J, Patirica Longshore ([id number]) on 09/10/2015 7:43:53 PM       Radiology Dg Chest 2 View  Result Date: 09/10/2015 CLINICAL DATA:  Chest pain EXAM: CHEST  2 VIEW COMPARISON:  07/27/2013 FINDINGS: Normal mediastinum and cardiac silhouette. Normal pulmonary vasculature. No evidence of effusion, infiltrate, or pneumothorax. No acute bony abnormality. IMPRESSION: No acute cardiopulmonary process. Electronically Signed   By: Genevive BiStewart  Edmunds M.D.   On: 09/10/2015 20:35    Procedures Procedures (including critical care time)  Medications Ordered in ED Medications - No data to display   Initial Impression / Assessment and Plan / ED Course  I have reviewed the triage vital signs and the nursing notes.  Pertinent labs & imaging results that were available during my care of the patient were reviewed by me and considered in my medical decision  making (see chart for details).  Clinical Course  Value Comment By Time  Potassium: 4.2 (Reviewed) Linwood DibblesJon Garnet Overfield, MD 08/25 1845    At this time there does not appear to be any evidence of an acute emergency medical condition and the patient appears stable for discharge with appropriate outpatient follow up.   Final Clinical Impressions(s) / ED Diagnoses   Final diagnoses:  Diarrhea of infectious origin    New Prescriptions New Prescriptions   LOPERAMIDE (IMODIUM) 2 MG CAPSULE    Take 1 capsule (2 mg total) by mouth 4 (four) times daily as needed for diarrhea or loose stools.   PROMETHAZINE (PHENERGAN) 25 MG TABLET    Take 1 tablet (25 mg total)  by mouth every 6 (six) hours as needed for nausea or vomiting.     Linwood Dibbles, MD 09/10/15 2133

## 2015-10-13 ENCOUNTER — Encounter (HOSPITAL_COMMUNITY): Payer: Self-pay | Admitting: Emergency Medicine

## 2015-10-13 ENCOUNTER — Ambulatory Visit (HOSPITAL_COMMUNITY)
Admission: EM | Admit: 2015-10-13 | Discharge: 2015-10-13 | Disposition: A | Discharge status: Home / Self Care | Payer: Medicaid Other | Attending: Internal Medicine | Admitting: Internal Medicine

## 2015-10-13 DIAGNOSIS — K529 Noninfective gastroenteritis and colitis, unspecified: Secondary | ICD-10-CM

## 2015-10-13 MED ORDER — ONDANSETRON HCL 4 MG PO TABS: 4.0000 mg | ORAL_TABLET | Freq: Four times a day (QID) | ORAL | 0 refills | Status: DC

## 2015-10-13 NOTE — Discharge Instructions (Signed)
Push fluids such as Gatorade

## 2015-10-13 NOTE — ED Provider Notes (Signed)
CSN: [id number]     Arrival date & time 10/13/15  1629 History   First MD Initiated Contact with Patient 10/13/15 1745     Chief Complaint  Patient presents with  . Diarrhea   (Consider location/radiation/quality/duration/timing/severity/associated sxs/prior Treatment) HPI 25 y/o female with 2 episodes of vomiting today.  No assoc pain. Able to eat chips but unable to drink fluids.  Daughter with diarrhea.  Past Medical History:  Diagnosis Date  . Bipolar 1 disorder (HCC)   . Depression   . Suicidal overdose [facility](HCC)    Past Surgical History:  Procedure Laterality Date  . NO PAST SURGERIES     Family History  Problem Relation Age of Onset  . Heart attack Mother   . Stomach cancer Mother   . Hypertension Father    Social History  Substance Use Topics  . Smoking status: Former Smoker    Packs/day: 0.50    Years: 1.00    Types: Cigarettes  . Smokeless tobacco: Never Used  . Alcohol use 0.6 oz/week    1 Cans of beer per week     Comment: vodka 2-3 times weekly   OB History    Gravida Para Term Preterm AB Living   2 1 1  0 0 1   SAB TAB Ectopic Multiple Live Births   0 0 0 0 1     Review of Systems  Denies: HEADACHE, NAUSEA, ABDOMINAL PAIN, CHEST PAIN, CONGESTION, DYSURIA, SHORTNESS OF BREATH  Allergies  Review of patient's allergies indicates no known allergies.  Home Medications   Prior to Admission medications   Medication Sig Start Date End Date Taking? Authorizing Provider  albuterol (PROVENTIL HFA;VENTOLIN HFA) 108 (90 BASE) MCG/ACT inhaler Inhale 2 puffs into the lungs every 6 (six) hours as needed. For wheezing/shortness of breath    Historical Provider, MD  HYDROcodone-acetaminophen (HYCET) 7.5-325 mg/15 ml solution Take 15 mLs by mouth every 8 (eight) hours as needed for moderate pain. 06/04/15   Garlon HatchetLisa M Sanders, PA-C  ibuprofen (ADVIL,MOTRIN) 600 MG tablet Take 1 tablet (600 mg total) by mouth every 6 (six) hours. 08/09/14   Montez MoritaMarie D Lawson, CNM  loperamide  (IMODIUM) 2 MG capsule Take 1 capsule (2 mg total) by mouth 4 (four) times daily as needed for diarrhea or loose stools. 09/10/15   Linwood DibblesJon Knapp, MD  ondansetron (ZOFRAN) 4 MG tablet Take 1 tablet (4 mg total) by mouth every 6 (six) hours. 10/13/15   Tharon AquasFrank C Xzaiver Vayda, PA  Prenatal Vit-Fe Fumarate-FA (PRENATAL MULTIVITAMIN) TABS tablet Take 1 tablet by mouth daily at 12 noon.    Historical Provider, MD  promethazine (PHENERGAN) 25 MG tablet Take 1 tablet (25 mg total) by mouth every 6 (six) hours as needed for nausea or vomiting. 09/10/15   Linwood DibblesJon Knapp, MD   Meds Ordered and Administered this Visit  Medications - No data to display  BP 116/80 (BP Location: Left Arm)   Pulse 84   Temp 98.6 F (37 C) (Oral)   Resp 18   LMP 08/06/2015   SpO2 100%   Breastfeeding? Unknown  No data found.   Physical Exam NURSES NOTES AND VITAL SIGNS REVIEWED. CONSTITUTIONAL: Well developed, well nourished, no acute distress HEENT: normocephalic, atraumatic, mucous membranes moist, not dehydrated.  EYES: Conjunctiva normal NECK:normal ROM, supple, no adenopathy PULMONARY:No respiratory distress, normal effort ABDOMINAL: Soft, ND, NT BS+, No CVAT MUSCULOSKELETAL: Normal ROM of all extremities,  SKIN: warm and dry without rash PSYCHIATRIC: Mood and affect, behavior are normal  Urgent Care Course   Clinical Course    Procedures (including critical care time)  Labs Review Labs Reviewed - No data to display  Imaging Review No results found.   Visual Acuity Review  Right Eye Distance:   Left Eye Distance:   Bilateral Distance:    Right Eye Near:   Left Eye Near:    Bilateral Near:         MDM   1. Gastroenteritis     Patient is reassured that there are no issues that require transfer to higher level of care at this time or additional tests. Patient is advised to continue home symptomatic treatment. Patient is advised that if there are new or worsening symptoms to attend the emergency  department, contact primary care provider, or return to UC. Instructions of care provided discharged home in stable condition.    THIS NOTE WAS GENERATED USING A VOICE RECOGNITION SOFTWARE PROGRAM. ALL REASONABLE EFFORTS  WERE MADE TO PROOFREAD THIS DOCUMENT FOR ACCURACY.  I have verbally reviewed the discharge instructions with the patient. A printed AVS was given to the patient.  All questions were answered prior to discharge.      Tharon Aquas, PA 10/13/15 Flossie Buffy

## 2015-10-13 NOTE — ED Triage Notes (Signed)
Pt. Stated, I was sick today with vomiting today, probably picked up from my daughter. Pt. Vomited 3 times

## 2015-10-19 ENCOUNTER — Encounter (HOSPITAL_COMMUNITY): Payer: Self-pay | Admitting: Emergency Medicine

## 2015-10-19 ENCOUNTER — Encounter (HOSPITAL_COMMUNITY): Payer: Self-pay | Admitting: Family Medicine

## 2015-10-19 ENCOUNTER — Emergency Department (HOSPITAL_COMMUNITY)
Admission: EM | Admit: 2015-10-19 | Discharge: 2015-10-19 | Disposition: A | Discharge status: Home / Self Care | Payer: Medicaid Other | Attending: Emergency Medicine | Admitting: Emergency Medicine

## 2015-10-19 ENCOUNTER — Emergency Department (HOSPITAL_COMMUNITY): Payer: Medicaid Other

## 2015-10-19 ENCOUNTER — Ambulatory Visit (HOSPITAL_COMMUNITY)
Admission: EM | Admit: 2015-10-19 | Discharge: 2015-10-19 | Disposition: A | Discharge status: Home / Self Care | Payer: Medicaid Other | Attending: Family Medicine | Admitting: Family Medicine

## 2015-10-19 DIAGNOSIS — R1011 Right upper quadrant pain: Secondary | ICD-10-CM | POA: Diagnosis present

## 2015-10-19 DIAGNOSIS — Z79899 Other long term (current) drug therapy: Secondary | ICD-10-CM | POA: Insufficient documentation

## 2015-10-19 DIAGNOSIS — Z87891 Personal history of nicotine dependence: Secondary | ICD-10-CM | POA: Insufficient documentation

## 2015-10-19 DIAGNOSIS — K8020 Calculus of gallbladder without cholecystitis without obstruction: Secondary | ICD-10-CM | POA: Diagnosis not present

## 2015-10-19 DIAGNOSIS — K8019 Calculus of gallbladder with other cholecystitis with obstruction: Secondary | ICD-10-CM

## 2015-10-19 DIAGNOSIS — R112 Nausea with vomiting, unspecified: Secondary | ICD-10-CM

## 2015-10-19 HISTORY — DX: Calculus of gallbladder with other cholecystitis with obstruction: K80.19

## 2015-10-19 LAB — COMPREHENSIVE METABOLIC PANEL
ALT: 29 U/L (ref 14–54)
AST: 26 U/L (ref 15–41)
Albumin: 4.1 g/dL (ref 3.5–5.0)
Alkaline Phosphatase: 52 U/L (ref 38–126)
Anion gap: 6 (ref 5–15)
BUN: 6 mg/dL (ref 6–20)
CALCIUM: 9.3 mg/dL (ref 8.9–10.3)
CHLORIDE: 108 mmol/L (ref 101–111)
CO2: 26 mmol/L (ref 22–32)
Creatinine, Ser: 0.7 mg/dL (ref 0.44–1.00)
GFR calc Af Amer: >60 mL/min (ref >60)
GFR calc non Af Amer: >60 mL/min (ref >60)
Glucose, Bld: 92 mg/dL (ref 65–99)
Potassium: 4.1 mmol/L (ref 3.5–5.1)
SODIUM: 140 mmol/L (ref 135–145)
Total Bilirubin: 0.5 mg/dL (ref 0.3–1.2)
Total Protein: 7.3 g/dL (ref 6.5–8.1)

## 2015-10-19 LAB — CBC
HCT: 40.2 % (ref 36.0–46.0)
HEMOGLOBIN: 12.7 g/dL (ref 12.0–15.0)
MCH: 28.4 pg (ref 26.0–34.0)
MCHC: 31.6 g/dL (ref 30.0–36.0)
MCV: 89.9 fL (ref 78.0–100.0)
Platelets: 325 10*3/uL (ref 150–400)
RBC: 4.47 MIL/uL (ref 3.87–5.11)
RDW: 13.1 % (ref 11.5–15.5)
WBC: 7.3 10*3/uL (ref 4.0–10.5)

## 2015-10-19 LAB — URINE MICROSCOPIC-ADD ON
Bacteria, UA: NONE SEEN
RBC / HPF: NONE SEEN RBC/hpf (ref 0–5)
WBC UA: NONE SEEN WBC/hpf (ref 0–5)

## 2015-10-19 LAB — LIPASE, BLOOD: LIPASE: 22 U/L (ref 11–51)

## 2015-10-19 LAB — I-STAT BETA HCG BLOOD, ED (MC, WL, AP ONLY)

## 2015-10-19 LAB — URINALYSIS, ROUTINE W REFLEX MICROSCOPIC
Bilirubin Urine: NEGATIVE
Glucose, UA: NEGATIVE mg/dL
HGB URINE DIPSTICK: NEGATIVE
Ketones, ur: NEGATIVE mg/dL
LEUKOCYTES UA: NEGATIVE
NITRITE: NEGATIVE
Protein, ur: 30 mg/dL — AB
SPECIFIC GRAVITY, URINE: 1.03 (ref 1.005–1.030)
pH: 8.5 — ABNORMAL HIGH (ref 5.0–8.0)

## 2015-10-19 MED ORDER — OXYCODONE-ACETAMINOPHEN 5-325 MG PO TABS: 1.0000 | ORAL_TABLET | ORAL | 0 refills | Status: DC | PRN

## 2015-10-19 MED ORDER — ONDANSETRON HCL 4 MG PO TABS: 4.0000 mg | ORAL_TABLET | Freq: Four times a day (QID) | ORAL | 0 refills | Status: DC

## 2015-10-19 MED ORDER — ONDANSETRON HCL 4 MG/2ML IJ SOLN
4.0000 mg | Freq: Once | INTRAMUSCULAR | Status: AC
  Administered 2015-10-19: 4 mg via INTRAVENOUS
  Filled 2015-10-19: qty 2

## 2015-10-19 MED ORDER — NAPROXEN 500 MG PO TABS: 500.0000 mg | ORAL_TABLET | Freq: Two times a day (BID) | ORAL | 0 refills | Status: DC

## 2015-10-19 MED ORDER — MORPHINE SULFATE (PF) 4 MG/ML IV SOLN
4.0000 mg | Freq: Once | INTRAVENOUS | Status: AC
  Administered 2015-10-19: 4 mg via INTRAVENOUS
  Filled 2015-10-19: qty 1

## 2015-10-19 NOTE — Discharge Instructions (Signed)
Your ultrasound today showed a few small gallstones, this is likely the cause of your symptoms.  Your labs are normal.  Please follow up with general surgery for elective cholecystectomy.  Return to the ED if you experience worsening pain, fever, nausea, vomiting, or any new symptoms.

## 2015-10-19 NOTE — ED Triage Notes (Signed)
Pt here for RUQ pain that started lat night with radiation into her back.; sts N,V. sts that she took a laxative with no relief but she is having normal BM last one this am.

## 2015-10-19 NOTE — ED Provider Notes (Signed)
[facility] DEPT Provider Note   CSN: [id number] Arrival date & time: 10/19/15  1507     History   Chief Complaint Chief Complaint  Patient presents with  . Abdominal Pain    HPI [name] [name] is a 25 y.o. female.  HPI   [name] [name] is a 25 y.o. female with PMH significant for bipolar disorder, depression, suicidal overdose who presents with gradual onset, constant, cramping RUQ abdominal pain that radiates to the back with associated N/V.  She states it has been going on intermittently x 2 months, but became especially worse last night after eating macaroni and cheese.  Seems to be worse with eating.  No modifying factors.  No medications PTA.  Denies fever, chills, hematemesis, diarrhea, constipation, urinary symptoms, vaginal discharge/bleeding, bloody stools, or any other symptoms.  No prior abdominal surgeries.  Denies EtOH or drug use.   Past Medical History:  Diagnosis Date  . Bipolar 1 disorder (HCC)   . Depression   . Suicidal overdose [facility])     Patient Active Problem List   Diagnosis Date Noted  . Hypertension affecting pregnancy 08/06/2014  . Pregnancy induced hypertension, antepartum 08/06/2014  . Environmental allergies 05/28/2014  . UTI (urinary tract infection) in pregnancy in second trimester 03/08/2014  . Supervision of normal pregnancy 03/04/2014  . Depression 04/29/2012    Past Surgical History:  Procedure Laterality Date  . NO PAST SURGERIES      OB History    Gravida Para Term Preterm AB Living   2 1 1  0 0 1   SAB TAB Ectopic Multiple Live Births   0 0 0 0 1       Home Medications    Prior to Admission medications   Medication Sig Start Date End Date Taking? Authorizing Provider  acetaminophen (TYLENOL) 500 MG tablet Take 500-1,000 mg by mouth every 6 (six) hours as needed (for pain).   Yes Historical Provider, MD  HYDROcodone-acetaminophen (HYCET) 7.5-325 mg/15 ml solution Take 15 mLs by mouth every 8 (eight) hours as needed for  moderate pain. 06/04/15  Yes Garlon Hatchet, PA-C  ibuprofen (ADVIL,MOTRIN) 600 MG tablet Take 1 tablet (600 mg total) by mouth every 6 (six) hours. 08/09/14  Yes Montez Morita, CNM  loperamide (IMODIUM) 2 MG capsule Take 1 capsule (2 mg total) by mouth 4 (four) times daily as needed for diarrhea or loose stools. 09/10/15  Yes Linwood Dibbles, MD  promethazine (PHENERGAN) 25 MG tablet Take 1 tablet (25 mg total) by mouth every 6 (six) hours as needed for nausea or vomiting. 09/10/15  Yes Linwood Dibbles, MD  naproxen (NAPROSYN) 500 MG tablet Take 1 tablet (500 mg total) by mouth 2 (two) times daily. 10/19/15   Tadd Holtmeyer, PA-C  ondansetron (ZOFRAN) 4 MG tablet Take 1 tablet (4 mg total) by mouth every 6 (six) hours. 10/19/15   Cheri Fowler, PA-C  oxyCODONE-acetaminophen (PERCOCET/ROXICET) 5-325 MG tablet Take 1 tablet by mouth every 4 (four) hours as needed for severe pain. 10/19/15   Cheri Fowler, PA-C    Family History Family History  Problem Relation Age of Onset  . Heart attack Mother   . Stomach cancer Mother   . Hypertension Father     Social History Social History  Substance Use Topics  . Smoking status: Former Smoker    Packs/day: 0.50    Years: 1.00    Types: Cigarettes  . Smokeless tobacco: Never Used  . Alcohol use 0.6 oz/week    1 Cans  of beer per week     Comment: vodka 2-3 times weekly     Allergies   Review of patient's allergies indicates no known allergies.   Review of Systems Review of Systems All other systems negative unless otherwise stated in HPI   Physical Exam Updated Vital Signs BP 113/75 (BP Location: Right Arm)   Pulse 64   Temp 98.5 F (36.9 C) (Oral)   Resp 18   Ht 5\' 5"  (1.651 m)   Wt 93 kg   LMP 08/06/2015   SpO2 100%   BMI 34.11 kg/m   Physical Exam  Constitutional: She is oriented to person, place, and time. She appears well-developed and well-nourished.  Non-toxic appearance. She does not have a sickly appearance. She does not appear ill.  HENT:    Head: Normocephalic and atraumatic.  Mouth/Throat: Oropharynx is clear and moist.  Eyes: Conjunctivae are normal.  Neck: Normal range of motion. Neck supple.  Cardiovascular: Normal rate and regular rhythm.   Pulmonary/Chest: Effort normal and breath sounds normal. No accessory muscle usage or stridor. No respiratory distress. She has no wheezes. She has no rhonchi. She has no rales.  Abdominal: Soft. Bowel sounds are normal. She exhibits no distension. There is tenderness in the right upper quadrant. There is positive Murphy's sign. There is no rebound, no guarding and no CVA tenderness.  Musculoskeletal: Normal range of motion.  Lymphadenopathy:    She has no cervical adenopathy.  Neurological: She is alert and oriented to person, place, and time.  Speech clear without dysarthria.  Skin: Skin is warm and dry.  Psychiatric: She has a normal mood and affect. Her behavior is normal.     ED Treatments / Results  Labs (all labs ordered are listed, but only abnormal results are displayed) Labs Reviewed  URINALYSIS, ROUTINE W REFLEX MICROSCOPIC (NOT AT [facility]) - Abnormal; Notable for the following:       Result Value   pH 8.5 (*)    Protein, ur 30 (*)    All other components within normal limits  URINE MICROSCOPIC-ADD ON - Abnormal; Notable for the following:    Squamous Epithelial / LPF 6-30 (*)    All other components within normal limits  LIPASE, BLOOD  COMPREHENSIVE METABOLIC PANEL  CBC  I-STAT BETA HCG BLOOD, ED ([facility], [facility], [facility] ONLY)    EKG  EKG Interpretation None       Radiology US Abdomen Limited Ruq  Result Date: 10/19/2015 CLINICAL DATA:  Right upper quadrant pain. EXAM: US ABDOMEN LIMITED - RIGHT UPPER QUADRANT COMPARISON:  None. FINDINGS: Gallbladder: Multiple small echogenic gallstones. Largest stone measures 0.5 cm. Gallbladder wall is thickened measuring up to 0.5 cm but the gallbladder is also decompressed. Reportedly, the patient does not have a sonographic  Murphy's sign. Common bile duct: Diameter: 0.4 cm. Liver: No focal lesion identified. Within normal limits in parenchymal echogenicity. IMPRESSION: Cholelithiasis. Gallbladder wall thickening is nonspecific because the gallbladder is decompressed. No biliary dilatation. Electronically Signed   By: Richarda Overlie M.D.   On: 10/19/2015 18:20    Procedures Procedures (including critical care time)  Medications Ordered in ED Medications  morphine 4 MG/ML injection 4 mg (4 mg Intravenous Given 10/19/15 1613)  ondansetron (ZOFRAN) injection 4 mg (4 mg Intravenous Given 10/19/15 1641)     Initial Impression / Assessment and Plan / ED Course  I have reviewed the triage vital signs and the nursing notes.  Pertinent labs & imaging results that were available during my  care of the patient were reviewed by me and considered in my medical decision making (see chart for details).  Clinical Course   Patient presents with RUQ abdominal pain with N/V.  No fever, chills.  VSS, NAD.  Patient appears well, non-toxic or ill.  Heart sounds nl, lungs clear, abdomen soft with RUQ tenderness, positive Murphy sign, no rebound, guarding, or rigidity.  DDx includes cholelithiasis, cholecystitis, choledocholithiasis, pancreatitis.  Low suspicion for nephrolithaisis or gynecologic emergency.  Will obtain labs and RUQ ultrasound for further evaluation.   Labs without acute abnormalities. Ultrasound reveals multiple small gallstones without obstruction or CBD dilatation.  Pain improved.  Has not had any vomiting in ED stay.  Recommend general surgery follow up.  Home with zofran, naproxen, and percocet.  Return precautions discussed.  Stable for discharge.     Final Clinical Impressions(s) / ED Diagnoses   Final diagnoses:  RUQ pain  Calculus of gallbladder without cholecystitis without obstruction  Nausea and vomiting, intractability of vomiting not specified, unspecified vomiting type    New Prescriptions New  Prescriptions   NAPROXEN (NAPROSYN) 500 MG TABLET    Take 1 tablet (500 mg total) by mouth 2 (two) times daily.   ONDANSETRON (ZOFRAN) 4 MG TABLET    Take 1 tablet (4 mg total) by mouth every 6 (six) hours.   OXYCODONE-ACETAMINOPHEN (PERCOCET/ROXICET) 5-325 MG TABLET    Take 1 tablet by mouth every 4 (four) hours as needed for severe pain.         Cheri FowlerKayla Montserrat Shek, PA-C 10/19/15 1852    Laurence Spatesachel Morgan Little, MD 10/23/15 [phone]

## 2015-10-19 NOTE — ED Notes (Signed)
Patient transported to Ultrasound 

## 2015-10-19 NOTE — ED Provider Notes (Addendum)
[facility]    CSN: [id number] Arrival date & time: 10/19/15  1258     History   Chief Complaint Chief Complaint  Patient presents with  . Abdominal Pain    HPI [name] [name] is a 25 y.o. female.   The history is provided by the patient.  Abdominal Pain  Pain location:  RUQ Pain quality: cramping   Pain radiates to:  Back Pain severity:  Moderate Onset quality:  Sudden Progression:  Unchanged Chronicity:  New Context: awakening from sleep   Relieved by:  None tried Worsened by:  Nothing Ineffective treatments: laxative. Associated symptoms: nausea and vomiting   Associated symptoms: no fever     Past Medical History:  Diagnosis Date  . Bipolar 1 disorder (HCC)   . Depression   . Suicidal overdose [facility])     Patient Active Problem List   Diagnosis Date Noted  . Hypertension affecting pregnancy 08/06/2014  . Pregnancy induced hypertension, antepartum 08/06/2014  . Environmental allergies 05/28/2014  . UTI (urinary tract infection) in pregnancy in second trimester 03/08/2014  . Supervision of normal pregnancy 03/04/2014  . Depression 04/29/2012    Past Surgical History:  Procedure Laterality Date  . NO PAST SURGERIES      OB History    Gravida Para Term Preterm AB Living   2 1 1  0 0 1   SAB TAB Ectopic Multiple Live Births   0 0 0 0 1       Home Medications    Prior to Admission medications   Medication Sig Start Date End Date Taking? Authorizing Provider  albuterol (PROVENTIL HFA;VENTOLIN HFA) 108 (90 BASE) MCG/ACT inhaler Inhale 2 puffs into the lungs every 6 (six) hours as needed. For wheezing/shortness of breath    Historical Provider, MD  HYDROcodone-acetaminophen (HYCET) 7.5-325 mg/15 ml solution Take 15 mLs by mouth every 8 (eight) hours as needed for moderate pain. 06/04/15   Garlon Hatchet, PA-C  ibuprofen (ADVIL,MOTRIN) 600 MG tablet Take 1 tablet (600 mg total) by mouth every 6 (six) hours. 08/09/14   Montez Morita, CNM    loperamide (IMODIUM) 2 MG capsule Take 1 capsule (2 mg total) by mouth 4 (four) times daily as needed for diarrhea or loose stools. 09/10/15   Linwood Dibbles, MD  ondansetron (ZOFRAN) 4 MG tablet Take 1 tablet (4 mg total) by mouth every 6 (six) hours. 10/13/15   Tharon Aquas, PA  Prenatal Vit-Fe Fumarate-FA (PRENATAL MULTIVITAMIN) TABS tablet Take 1 tablet by mouth daily at 12 noon.    Historical Provider, MD  promethazine (PHENERGAN) 25 MG tablet Take 1 tablet (25 mg total) by mouth every 6 (six) hours as needed for nausea or vomiting. 09/10/15   Linwood Dibbles, MD    Family History Family History  Problem Relation Age of Onset  . Heart attack Mother   . Stomach cancer Mother   . Hypertension Father     Social History Social History  Substance Use Topics  . Smoking status: Former Smoker    Packs/day: 0.50    Years: 1.00    Types: Cigarettes  . Smokeless tobacco: Never Used  . Alcohol use 0.6 oz/week    1 Cans of beer per week     Comment: vodka 2-3 times weekly     Allergies   Review of patient's allergies indicates no known allergies.   Review of Systems Review of Systems  Constitutional: Negative.  Negative for fever.  Gastrointestinal: Positive for abdominal pain, nausea  and vomiting.  All other systems reviewed and are negative.    Physical Exam Triage Vital Signs ED Triage Vitals  Enc Vitals Group     BP 10/19/15 1405 126/88     Pulse Rate 10/19/15 1405 85     Resp 10/19/15 1405 16     Temp 10/19/15 1405 98.4 F (36.9 C)     Temp Source 10/19/15 1405 Oral     SpO2 10/19/15 1405 100 %     Weight --      Height --      Head Circumference --      Peak Flow --      Pain Score 10/19/15 1408 10     Pain Loc --      Pain Edu? --      Excl. in GC? --    No data found.   Updated Vital Signs BP 126/88   Pulse 85   Temp 98.4 F (36.9 C) (Oral)   Resp 16   LMP 08/06/2015   SpO2 100%   Visual Acuity Right Eye Distance:   Left Eye Distance:   Bilateral  Distance:    Right Eye Near:   Left Eye Near:    Bilateral Near:     Physical Exam  Constitutional: She appears well-developed and well-nourished. She appears distressed.  Pulmonary/Chest: Effort normal and breath sounds normal.  Abdominal: Soft. She exhibits no mass. Bowel sounds are decreased. There is tenderness in the right upper quadrant. There is positive Murphy's sign. There is no rebound. No hernia.    Skin: Skin is warm and dry.  Nursing note and vitals reviewed.    UC Treatments / Results  Labs (all labs ordered are listed, but only abnormal results are displayed) Labs Reviewed - No data to display  EKG  EKG Interpretation None       Radiology No results found.  Procedures Procedures (including critical care time)  Medications Ordered in UC Medications - No data to display   Initial Impression / Assessment and Plan / UC Course  I have reviewed the triage vital signs and the nursing notes.  Pertinent labs & imaging results that were available during my care of the patient were reviewed by me and considered in my medical decision making (see chart for details).  Clinical Course      Final Clinical Impressions(s) / UC Diagnoses   Final diagnoses:  None    New Prescriptions New Prescriptions   No medications on file     Linna HoffJames D Kindl, MD 10/19/15 1459    Linna HoffJames D Kindl, MD 10/19/15 1501

## 2015-10-19 NOTE — ED Triage Notes (Signed)
Pt states "i went to UC, and they told me I might be having a gallbladder attack and sent me here." Pt c/o RUQ abdominal pain that travels to her back. Pt endorses vomiting. C/o nausea.

## 2015-10-25 ENCOUNTER — Ambulatory Visit: Payer: Self-pay | Admitting: Surgery

## 2015-10-25 NOTE — H&P (Signed)
[name] PrestoJustina [name] 10/25/2015 2:38 PM Location: [facility] Patient #: [id number] DOB: 08/29/1990 Single / Language: [place] / Race: Black or African American Female  History of Present Illness Maisie Fus(Talmage Teaster A. Verna Hamon MD; 10/25/2015 3:34 PM) Patient words: Pt presents at the request of the ED for biliary colic. Pt has had 2 - 3 attacks of RUQ abdominal pain nausea and vomiting. Pain is worse with fatty foods. U/S shows gallstones and contracted gallbladder. She is pain fre currently. Pain is sharp in nature without radiation.  The patient is a 25 year old female.   Other Problems Fay Records(Ashley Beck, CMA; 10/25/2015 2:39 PM) Cholelithiasis Heart murmur  Past Surgical History Fay Records(Ashley Beck, CMA; 10/25/2015 2:39 PM) No pertinent past surgical history  Diagnostic Studies History Fay Records(Ashley Beck, [place]; 10/25/2015 2:39 PM) Mammogram never Pap Smear 1-5 years ago  Allergies Fay Records(Ashley Beck, CMA; 10/25/2015 2:39 PM) No Known Drug Allergies 10/25/2015  Medication History Fay Records(Ashley Beck, CMA; 10/25/2015 2:40 PM) Percocet (5-325MG  Tablet, Oral) Active. Medications Reconciled  Social History Fay Records(Ashley Beck, [place]; 10/25/2015 2:39 PM) Alcohol use Occasional alcohol use. No caffeine use No drug use Tobacco use Current some day smoker.  Family History Fay Records(Ashley Beck, [place]; 10/25/2015 2:39 PM) Alcohol Abuse Sister. Arthritis Father, Mother. Cervical Cancer Mother. Diabetes Mellitus Father. Hypertension Father. Seizure disorder Daughter, Mother.  Pregnancy / Birth History Fay Records(Ashley Beck, CMA; 10/25/2015 2:39 PM) Age at menarche 16 years. Gravida 1 Length (months) of breastfeeding 3-6 Maternal age 24-25 Para 1 Regular periods     Review of Systems Fay Records(Ashley Beck CMA; 10/25/2015 2:39 PM) General Present- Appetite Loss and Weight Loss. Not Present- Chills, Fatigue, Fever, Night Sweats and Weight Gain. Skin Not Present- Change in Wart/Mole, Dryness, Hives, Jaundice, New Lesions,  Non-Healing Wounds, Rash and Ulcer. HEENT Not Present- Earache, Hearing Loss, Hoarseness, Nose Bleed, Oral Ulcers, Ringing in the Ears, Seasonal Allergies, Sinus Pain, Sore Throat, Visual Disturbances, Wears glasses/contact lenses and Yellow Eyes. Respiratory Present- Chronic Cough. Not Present- Bloody sputum, Difficulty Breathing, Snoring and Wheezing. Breast Not Present- Breast Mass, Breast Pain, Nipple Discharge and Skin Changes. Cardiovascular Not Present- Chest Pain, Difficulty Breathing Lying Down, Leg Cramps, Palpitations, Rapid Heart Rate, Shortness of Breath and Swelling of Extremities. Gastrointestinal Present- Change in Bowel Habits, Chronic diarrhea, Excessive gas and Gets full quickly at meals. Not Present- Abdominal Pain, Bloating, Bloody Stool, Constipation, Difficulty Swallowing, Hemorrhoids, Indigestion, Nausea, Rectal Pain and Vomiting. Female Genitourinary Not Present- Frequency, Nocturia, Painful Urination, Pelvic Pain and Urgency. Musculoskeletal Not Present- Back Pain, Joint Pain, Joint Stiffness, Muscle Pain, Muscle Weakness and Swelling of Extremities. Neurological Not Present- Decreased Memory, Fainting, Headaches, Numbness, Seizures, Tingling, Tremor, Trouble walking and Weakness. Psychiatric Not Present- Anxiety, Bipolar, Change in Sleep Pattern, Depression, Fearful and Frequent crying. Endocrine Not Present- Cold Intolerance, Excessive Hunger, Hair Changes, Heat Intolerance, Hot flashes and New Diabetes. Hematology Not Present- Blood Thinners, Easy Bruising, Excessive bleeding, Gland problems, HIV and Persistent Infections.  Vitals Fay Records(Ashley Beck CMA; 10/25/2015 2:40 PM) 10/25/2015 2:40 PM Weight: 210 lb Height: 66in Body Surface Area: 2.04 m Body Mass Index: 33.89 kg/m  Temp.: 98.52F(Temporal)  Pulse: 70 (Regular)  BP: 130/72 (Sitting, Left Arm, Standard)      Physical Exam (Jobin Montelongo A. Annamay Laymon MD; 10/25/2015 3:34 PM)  General Mental  Status-Alert. General Appearance-Consistent with stated age. Hydration-Well hydrated. Voice-Normal.  Head and Neck Head-normocephalic, atraumatic with no lesions or palpable masses.  Eye Eyeball - Bilateral-Extraocular movements intact. Sclera/Conjunctiva - Bilateral-No scleral icterus.  Chest and Lung Exam Chest and lung exam reveals -  quiet, even and easy respiratory effort with no use of accessory muscles and on auscultation, normal breath sounds, no adventitious sounds and normal vocal resonance. Inspection Chest Wall - Normal. Back - normal.  Cardiovascular Cardiovascular examination reveals -on palpation PMI is normal in location and amplitude, no palpable S3 or S4. Normal cardiac borders., normal heart sounds, regular rate and rhythm with no murmurs, carotid auscultation reveals no bruits and normal pedal pulses bilaterally.  Abdomen Inspection Inspection of the abdomen reveals - No Hernias. Skin - Scar - no surgical scars. Palpation/Percussion Palpation and Percussion of the abdomen reveal - Soft, Non Tender, No Rebound tenderness, No Rigidity (guarding) and No hepatosplenomegaly. Auscultation Auscultation of the abdomen reveals - Bowel sounds normal.  Neurologic Neurologic evaluation reveals -alert and oriented x 3 with no impairment of recent or remote memory. Mental Status-Normal.  Musculoskeletal Normal Exam - Left-Upper Extremity Strength Normal and Lower Extremity Strength Normal. Normal Exam - Right-Upper Extremity Strength Normal, Lower Extremity Weakness.    Assessment & Plan (Malaysha Arlen A. Moustafa Mossa MD; 10/25/2015 3:35 PM)  SYMPTOMATIC CHOLELITHIASIS (K80.20) Impression: Discussed medical and surgical therapy. She would like to proceed with laparoscopic cholecystectomy.     The procedure has been discussed with the patient. Risks of laparoscopic cholecystectomy include bleeding, infection, bile duct injury, leak, death, open surgery,  diarrhea, other surgery, organ injury, blood vessel injury, DVT, and additional care.    discussed nonoperative vs operative management  Current Plans You are being scheduled for surgery - Our schedulers will call you.  You should hear from our office's scheduling department within 5 working days about the location, date, and time of surgery. We try to make accommodations for patient's preferences in scheduling surgery, but sometimes the OR schedule or the surgeon's schedule prevents [place] from making those accommodations.  If you have not heard from our office [phone]) in 5 working days, call the office and ask for your surgeon's nurse.  If you have other questions about your diagnosis, plan, or surgery, call the office and ask for your surgeon's nurse.  The anatomy & physiology of hepatobiliary & pancreatic function was discussed. The pathophysiology of gallbladder dysfunction was discussed. Natural history risks without surgery was discussed. I feel the risks of no intervention will lead to serious problems that outweigh the operative risks; therefore, I recommended cholecystectomy to remove the pathology. I explained laparoscopic techniques with possible need for an open approach. Probable cholangiogram to evaluate the bilary tract was explained as well.  Risks such as bleeding, infection, abscess, leak, injury to other organs, need for further treatment, heart attack, death, and other risks were discussed. I noted a good likelihood this will help address the problem. Possibility that this will not correct all abdominal symptoms was explained. Goals of post-operative recovery were discussed as well. We will work to minimize complications. An educational handout further explaining the pathology and treatment options was given as well. Questions were answered. The patient expresses understanding & wishes to proceed with surgery.  Pt Education - Laparoscopic Cholecystectomy: gallbladder

## 2015-11-03 ENCOUNTER — Observation Stay (HOSPITAL_COMMUNITY)
Admission: EM | Admit: 2015-11-03 | Discharge: 2015-11-05 | Disposition: A | Discharge status: Home / Self Care | Payer: Medicaid Other | Attending: General Surgery | Admitting: General Surgery

## 2015-11-03 ENCOUNTER — Encounter (HOSPITAL_COMMUNITY): Payer: Self-pay | Admitting: Emergency Medicine

## 2015-11-03 DIAGNOSIS — Z87891 Personal history of nicotine dependence: Secondary | ICD-10-CM | POA: Insufficient documentation

## 2015-11-03 DIAGNOSIS — Z8249 Family history of ischemic heart disease and other diseases of the circulatory system: Secondary | ICD-10-CM | POA: Diagnosis not present

## 2015-11-03 DIAGNOSIS — Z800 Family history of malignant neoplasm of digestive organs: Secondary | ICD-10-CM | POA: Diagnosis not present

## 2015-11-03 DIAGNOSIS — K8020 Calculus of gallbladder without cholecystitis without obstruction: Secondary | ICD-10-CM | POA: Diagnosis present

## 2015-11-03 DIAGNOSIS — Z419 Encounter for procedure for purposes other than remedying health state, unspecified: Secondary | ICD-10-CM

## 2015-11-03 DIAGNOSIS — K8010 Calculus of gallbladder with chronic cholecystitis without obstruction: Principal | ICD-10-CM | POA: Insufficient documentation

## 2015-11-03 DIAGNOSIS — F319 Bipolar disorder, unspecified: Secondary | ICD-10-CM | POA: Diagnosis not present

## 2015-11-03 DIAGNOSIS — K819 Cholecystitis, unspecified: Secondary | ICD-10-CM

## 2015-11-03 HISTORY — DX: Calculus of gallbladder with other cholecystitis with obstruction: K80.19

## 2015-11-03 LAB — COMPREHENSIVE METABOLIC PANEL
ALBUMIN: 4.4 g/dL (ref 3.5–5.0)
ALT: 17 U/L (ref 14–54)
ANION GAP: 9 (ref 5–15)
AST: 16 U/L (ref 15–41)
Alkaline Phosphatase: 51 U/L (ref 38–126)
BILIRUBIN TOTAL: 0.6 mg/dL (ref 0.3–1.2)
BUN: 13 mg/dL (ref 6–20)
CHLORIDE: 105 mmol/L (ref 101–111)
CO2: 23 mmol/L (ref 22–32)
Calcium: 9.5 mg/dL (ref 8.9–10.3)
Creatinine, Ser: 0.73 mg/dL (ref 0.44–1.00)
GFR calc Af Amer: >60 mL/min (ref >60)
GFR calc non Af Amer: >60 mL/min (ref >60)
GLUCOSE: 98 mg/dL (ref 65–99)
POTASSIUM: 3.7 mmol/L (ref 3.5–5.1)
Sodium: 137 mmol/L (ref 135–145)
TOTAL PROTEIN: 7.6 g/dL (ref 6.5–8.1)

## 2015-11-03 LAB — I-STAT BETA HCG BLOOD, ED (MC, WL, AP ONLY): I-stat hCG, quantitative: <5 m[IU]/mL (ref <5)

## 2015-11-03 LAB — URINALYSIS, ROUTINE W REFLEX MICROSCOPIC
GLUCOSE, UA: NEGATIVE mg/dL
Hgb urine dipstick: NEGATIVE
KETONES UR: NEGATIVE mg/dL
LEUKOCYTES UA: NEGATIVE
NITRITE: NEGATIVE
PH: 5.5 (ref 5.0–8.0)
Protein, ur: NEGATIVE mg/dL
SPECIFIC GRAVITY, URINE: 1.035 — AB (ref 1.005–1.030)

## 2015-11-03 LAB — CBC
HCT: 39.6 % (ref 36.0–46.0)
HEMOGLOBIN: 12.9 g/dL (ref 12.0–15.0)
MCH: 28.4 pg (ref 26.0–34.0)
MCHC: 32.6 g/dL (ref 30.0–36.0)
MCV: 87 fL (ref 78.0–100.0)
Platelets: 331 10*3/uL (ref 150–400)
RBC: 4.55 MIL/uL (ref 3.87–5.11)
RDW: 12.9 % (ref 11.5–15.5)
WBC: 6.8 10*3/uL (ref 4.0–10.5)

## 2015-11-03 LAB — LIPASE, BLOOD: Lipase: 19 U/L (ref 11–51)

## 2015-11-03 MED ORDER — HYDROCODONE-ACETAMINOPHEN 5-325 MG PO TABS
1.0000 | ORAL_TABLET | Freq: Once | ORAL | Status: DC
  Filled 2015-11-03: qty 1

## 2015-11-03 MED ORDER — ONDANSETRON 4 MG PO TBDP
8.0000 mg | ORAL_TABLET | Freq: Once | ORAL | Status: DC
  Filled 2015-11-03: qty 2

## 2015-11-03 NOTE — ED Notes (Signed)
Called to reassess vitals pt did not answer.

## 2015-11-03 NOTE — ED Triage Notes (Signed)
Pt states she was seen here on 10/19/15 for abdominal pain and diagnosed with gallstones and [facility]bladder removed. Pt was given percocet and naproxen for pain however she states it is not helping her. Pt does not have insurance and they will not see her anymore. Pt was scheduled for surgery however they will not do procedure unitl she pays.

## 2015-11-03 NOTE — ED Provider Notes (Signed)
[facility]   CSN: [id number] Arrival date & time: 11/03/15  1656  By signing my name below, I, Rosario Adie, attest that this documentation has been prepared under the direction and in the presence of Zadie Rhine, MD. Electronically Signed: Rosario Adie, ED Scribe. 11/03/15. 11:48 PM.  History   Chief Complaint Chief Complaint  Patient presents with  . Abdominal Pain    gallstones   The history is provided by the patient and medical records. No language interpreter was used.  Abdominal Pain   This is a recurrent problem. The current episode started more than 1 week ago. The problem occurs constantly. The problem has been gradually worsening. The pain is located in the RUQ. The pain is at a severity of 9/10. Associated symptoms include fever and diarrhea. Pertinent negatives include nausea and vomiting. Past workup includes ultrasound. Her past medical history is significant for gallstones.   HPI Comments: [name] [name] is a 25 y.o. female with a PMHx of asthma, who presents to the Emergency Department complaining of constant, dull right-sided upper abdominal pain onset ~2.5 months ago, worsening over the past ~3 weeks. She states that her pain is now radiating into her back. She reports associated subjective fever and diarrhea secondary to her abdominal pain. Pt was seen in the ED on 10/19/15 and at that time she was dx'd w/ Gallstones confirmed via [place] study and advised to f/u w/ surgery for removal. She states that she did f/u w/ surgery; however, she notes that they will not perform her surgery secondary to her being unable to pay. Pt has been taking rx'd Percocet and Naproxen from her previous visit with minimal relief of her pain. She reports that initially her pain was associated with eating but it is now present all of the time. Denies vomiting, nausea, chest pain, shortness of breath, or any other associated symptoms.   Past Medical History:    Diagnosis Date  . Bipolar 1 disorder (HCC)   . Depression   . Gallstones and inflammation of gallbladder with obstruction 10/19/2015  . Suicidal overdose [facility])    Patient Active Problem List   Diagnosis Date Noted  . Hypertension affecting pregnancy 08/06/2014  . Pregnancy induced hypertension, antepartum 08/06/2014  . Environmental allergies 05/28/2014  . UTI (urinary tract infection) in pregnancy in second trimester 03/08/2014  . Supervision of normal pregnancy 03/04/2014  . Depression 04/29/2012   Past Surgical History:  Procedure Laterality Date  . NO PAST SURGERIES     OB History    Gravida Para Term Preterm AB Living   2 1 1  0 0 1   SAB TAB Ectopic Multiple Live Births   0 0 0 0 1     Home Medications    Prior to Admission medications   Medication Sig Start Date End Date Taking? Authorizing Provider  naproxen (NAPROSYN) 500 MG tablet Take 1 tablet (500 mg total) by mouth 2 (two) times daily. 10/19/15  Yes Cheri Fowler, PA-C  oxyCODONE-acetaminophen (PERCOCET/ROXICET) 5-325 MG tablet Take 1 tablet by mouth every 4 (four) hours as needed for severe pain. 10/19/15  Yes Cheri Fowler, PA-C  HYDROcodone-acetaminophen (HYCET) 7.5-325 mg/15 ml solution Take 15 mLs by mouth every 8 (eight) hours as needed for moderate pain. Patient not taking: Reported on 11/03/2015 06/04/15   Garlon Hatchet, PA-C  ibuprofen (ADVIL,MOTRIN) 600 MG tablet Take 1 tablet (600 mg total) by mouth every 6 (six) hours. Patient not taking: Reported on 11/03/2015 08/09/14  Montez Morita, CNM  loperamide (IMODIUM) 2 MG capsule Take 1 capsule (2 mg total) by mouth 4 (four) times daily as needed for diarrhea or loose stools. Patient not taking: Reported on 11/03/2015 09/10/15   Linwood Dibbles, MD  ondansetron (ZOFRAN) 4 MG tablet Take 1 tablet (4 mg total) by mouth every 6 (six) hours. Patient not taking: Reported on 11/03/2015 10/19/15   Cheri Fowler, PA-C  promethazine (PHENERGAN) 25 MG tablet Take 1 tablet (25 mg  total) by mouth every 6 (six) hours as needed for nausea or vomiting. Patient not taking: Reported on 11/03/2015 09/10/15   Linwood Dibbles, MD   Family History Family History  Problem Relation Age of Onset  . Heart attack Mother   . Stomach cancer Mother   . Hypertension Father    Social History Social History  Substance Use Topics  . Smoking status: Former Smoker    Packs/day: 0.50    Years: 1.00    Types: Cigarettes  . Smokeless tobacco: Never Used     Comment: quit 2 years ago  . Alcohol use 0.6 oz/week    1 Cans of beer per week     Comment: vodka 2-3 times weekly   Allergies   Review of patient's allergies indicates no known allergies.  Review of Systems Review of Systems  Constitutional: Positive for fever.  Respiratory: Negative for shortness of breath.   Cardiovascular: Negative for chest pain.  Gastrointestinal: Positive for abdominal pain and diarrhea. Negative for nausea and vomiting.  All other systems reviewed and are negative.  Physical Exam Updated Vital Signs BP 132/70 (BP Location: Left Arm)   Pulse 64   Temp 98.7 F (37.1 C) (Oral)   Resp 19   Ht 5\' 6"  (1.676 m)   Wt 210 lb (95.3 kg)   LMP 10/25/2015 (Exact Date)   SpO2 96%   BMI 33.89 kg/m   Physical Exam CONSTITUTIONAL: Well developed/well nourished HEAD: Normocephalic/atraumatic EYES: EOMI/PERRL; no icterus  ENMT: Mucous membranes moist NECK: supple no meningeal signs SPINE/BACK:entire spine nontender CV: S1/S2 noted, no murmurs/rubs/gallops noted LUNGS: Lungs are clear to auscultation bilaterally, no apparent distress ABDOMEN: soft, moderate RUQ tenderness, no rebound or guarding,  GU:no cva tenderness NEURO: Pt is awake/alert/appropriate, moves all extremitiesx4.  No facial droop.   EXTREMITIES: pulses normal/equal, full ROM SKIN: warm, color normal PSYCH: no abnormalities of mood noted, alert and oriented to situation  ED Treatments / Results  DIAGNOSTIC STUDIES: Oxygen Saturation is  96% on RA, normal by my interpretation.   COORDINATION OF CARE: 11:49 PM-Discussed next steps with pt. Pt verbalized understanding and is agreeable with the plan.   Labs (all labs ordered are listed, but only abnormal results are displayed) Labs Reviewed  URINALYSIS, ROUTINE W REFLEX MICROSCOPIC (NOT AT [facility]) - Abnormal; Notable for the following:       Result Value   APPearance HAZY (*)    Specific Gravity, Urine 1.035 (*)    Bilirubin Urine SMALL (*)    All other components within normal limits  LIPASE, BLOOD  COMPREHENSIVE METABOLIC PANEL  CBC  I-STAT BETA HCG BLOOD, ED ([facility], [facility], [facility] ONLY)   EKG  EKG Interpretation None      Radiology No results found.  Procedures Procedures   Medications Ordered in ED Medications  ondansetron (ZOFRAN-ODT) disintegrating tablet 8 mg (8 mg Oral Not Given 11/04/15 0010)  HYDROcodone-acetaminophen (NORCO/VICODIN) 5-325 MG per tablet 1 tablet (1 tablet Oral Not Given 11/04/15 0011)  sodium chloride 0.9 %  bolus 1,000 mL (not administered)  morphine 4 MG/ML injection 4 mg (not administered)  ondansetron (ZOFRAN) injection 4 mg (not administered)  cefTRIAXone (ROCEPHIN) 2 g in dextrose 5 % 50 mL IVPB (not administered)    Initial Impression / Assessment and Plan / ED Course  I have reviewed the triage vital signs and the nursing notes.  Pertinent labs  results that were available during my care of the patient were reviewed by me and considered in my medical decision making (see chart for details).  Clinical Course   Pt with known cholelithiasis here with intractable RUQ pain D/w dr wyatt with surgery Will admit for observation, he requests to place holding orders and also start antibiotics   Final Clinical Impressions(s) / ED Diagnoses   Final diagnoses:  Calculus of gallbladder without cholecystitis without obstruction   New Prescriptions New Prescriptions   No medications on file   I personally performed the services  described in this documentation, which was scribed in my presence. The recorded information has been reviewed and is accurate.       Zadie Rhineonald Ivania Teagarden, MD 11/04/15 [phone]

## 2015-11-04 ENCOUNTER — Encounter (HOSPITAL_COMMUNITY): Payer: Self-pay | Admitting: Anesthesiology

## 2015-11-04 ENCOUNTER — Observation Stay (HOSPITAL_COMMUNITY): Payer: Medicaid Other | Admitting: Anesthesiology

## 2015-11-04 ENCOUNTER — Observation Stay (HOSPITAL_COMMUNITY): Payer: Medicaid Other

## 2015-11-04 ENCOUNTER — Encounter (HOSPITAL_COMMUNITY)
Admission: EM | Disposition: A | Discharge status: Home / Self Care | Payer: Self-pay | Source: Home / Self Care | Attending: Emergency Medicine

## 2015-11-04 DIAGNOSIS — Z800 Family history of malignant neoplasm of digestive organs: Secondary | ICD-10-CM | POA: Diagnosis not present

## 2015-11-04 DIAGNOSIS — F319 Bipolar disorder, unspecified: Secondary | ICD-10-CM | POA: Diagnosis not present

## 2015-11-04 DIAGNOSIS — Z8249 Family history of ischemic heart disease and other diseases of the circulatory system: Secondary | ICD-10-CM | POA: Diagnosis not present

## 2015-11-04 DIAGNOSIS — K8010 Calculus of gallbladder with chronic cholecystitis without obstruction: Secondary | ICD-10-CM | POA: Diagnosis not present

## 2015-11-04 DIAGNOSIS — K8020 Calculus of gallbladder without cholecystitis without obstruction: Secondary | ICD-10-CM | POA: Diagnosis present

## 2015-11-04 HISTORY — PX: CHOLECYSTECTOMY: SHX55

## 2015-11-04 LAB — SURGICAL PCR SCREEN
MRSA, PCR: NEGATIVE
STAPHYLOCOCCUS AUREUS: NEGATIVE

## 2015-11-04 SURGERY — LAPAROSCOPIC CHOLECYSTECTOMY WITH INTRAOPERATIVE CHOLANGIOGRAM
Anesthesia: General | Site: Abdomen

## 2015-11-04 SURGERY — LAPAROSCOPIC CHOLECYSTECTOMY WITH INTRAOPERATIVE CHOLANGIOGRAM
Anesthesia: General

## 2015-11-04 MED ORDER — FENTANYL CITRATE (PF) 100 MCG/2ML IJ SOLN
INTRAMUSCULAR | Status: AC
  Filled 2015-11-04: qty 4

## 2015-11-04 MED ORDER — ONDANSETRON HCL 4 MG/2ML IJ SOLN
INTRAMUSCULAR | Status: DC | PRN
  Administered 2015-11-04: 4 mg via INTRAVENOUS

## 2015-11-04 MED ORDER — ONDANSETRON HCL 4 MG/2ML IJ SOLN
4.0000 mg | Freq: Once | INTRAMUSCULAR | Status: AC
  Administered 2015-11-04: 4 mg via INTRAVENOUS
  Filled 2015-11-04: qty 2

## 2015-11-04 MED ORDER — SUGAMMADEX SODIUM 200 MG/2ML IV SOLN
INTRAVENOUS | Status: DC | PRN
  Administered 2015-11-04: 200 mg via INTRAVENOUS

## 2015-11-04 MED ORDER — LACTATED RINGERS IV SOLN
INTRAVENOUS | Status: DC
  Administered 2015-11-04 (×3): via INTRAVENOUS

## 2015-11-04 MED ORDER — PHENYLEPHRINE 40 MCG/ML (10ML) SYRINGE FOR IV PUSH (FOR BLOOD PRESSURE SUPPORT)
PREFILLED_SYRINGE | INTRAVENOUS | Status: AC
  Filled 2015-11-04: qty 10

## 2015-11-04 MED ORDER — ROCURONIUM BROMIDE 100 MG/10ML IV SOLN
INTRAVENOUS | Status: DC | PRN
  Administered 2015-11-04: 50 mg via INTRAVENOUS

## 2015-11-04 MED ORDER — SODIUM CHLORIDE 0.9 % IV BOLUS (SEPSIS)
1000.0000 mL | Freq: Once | INTRAVENOUS | Status: AC
  Administered 2015-11-04: 1000 mL via INTRAVENOUS

## 2015-11-04 MED ORDER — BOOST / RESOURCE BREEZE PO LIQD
1.0000 | Freq: Three times a day (TID) | ORAL | Status: DC
  Administered 2015-11-04: 1 via ORAL

## 2015-11-04 MED ORDER — 0.9 % SODIUM CHLORIDE (POUR BTL) OPTIME
TOPICAL | Status: DC | PRN
  Administered 2015-11-04: 1000 mL

## 2015-11-04 MED ORDER — CHLORHEXIDINE GLUCONATE CLOTH 2 % EX PADS: 6.0000 | MEDICATED_PAD | Freq: Once | CUTANEOUS | Status: DC

## 2015-11-04 MED ORDER — CEFTRIAXONE SODIUM 2 G IJ SOLR
2.0000 g | Freq: Once | INTRAMUSCULAR | Status: AC
  Administered 2015-11-04: 2 g via INTRAVENOUS
  Filled 2015-11-04 (×2): qty 2

## 2015-11-04 MED ORDER — FENTANYL CITRATE (PF) 100 MCG/2ML IJ SOLN
INTRAMUSCULAR | Status: DC | PRN
  Administered 2015-11-04 (×2): 100 ug via INTRAVENOUS

## 2015-11-04 MED ORDER — MORPHINE SULFATE (PF) 4 MG/ML IV SOLN
4.0000 mg | Freq: Once | INTRAVENOUS | Status: AC
  Administered 2015-11-04: 4 mg via INTRAVENOUS
  Filled 2015-11-04: qty 1

## 2015-11-04 MED ORDER — SODIUM CHLORIDE 0.9 % IR SOLN
Status: DC | PRN
  Administered 2015-11-04: 1000 mL

## 2015-11-04 MED ORDER — PROMETHAZINE HCL 25 MG/ML IJ SOLN: 6.2500 mg | INTRAMUSCULAR | Status: DC | PRN

## 2015-11-04 MED ORDER — CHLORHEXIDINE GLUCONATE CLOTH 2 % EX PADS
6.0000 | MEDICATED_PAD | Freq: Once | CUTANEOUS | Status: AC
  Administered 2015-11-04: 6 via TOPICAL

## 2015-11-04 MED ORDER — FENTANYL CITRATE (PF) 100 MCG/2ML IJ SOLN
25.0000 ug | INTRAMUSCULAR | Status: DC | PRN
  Administered 2015-11-04: 50 ug via INTRAVENOUS

## 2015-11-04 MED ORDER — CEFAZOLIN SODIUM-DEXTROSE 2-4 GM/100ML-% IV SOLN
2.0000 g | INTRAVENOUS | Status: AC
  Administered 2015-11-04: 2 g via INTRAVENOUS
  Filled 2015-11-04: qty 100

## 2015-11-04 MED ORDER — SODIUM CHLORIDE 0.9 % IV SOLN: INTRAVENOUS | Status: DC

## 2015-11-04 MED ORDER — BUPIVACAINE-EPINEPHRINE 0.25% -1:200000 IJ SOLN
INTRAMUSCULAR | Status: DC | PRN
  Administered 2015-11-04: 30 mL

## 2015-11-04 MED ORDER — FENTANYL CITRATE (PF) 100 MCG/2ML IJ SOLN
INTRAMUSCULAR | Status: AC
  Filled 2015-11-04: qty 2

## 2015-11-04 MED ORDER — HYDROMORPHONE HCL 1 MG/ML IJ SOLN: 0.5000 mg | INTRAMUSCULAR | Status: AC | PRN

## 2015-11-04 MED ORDER — INFLUENZA VAC SPLIT QUAD 0.5 ML IM SUSY
0.5000 mL | PREFILLED_SYRINGE | INTRAMUSCULAR | Status: AC
  Administered 2015-11-05: 0.5 mL via INTRAMUSCULAR
  Filled 2015-11-04: qty 0.5

## 2015-11-04 MED ORDER — PROPOFOL 10 MG/ML IV BOLUS
INTRAVENOUS | Status: AC
  Filled 2015-11-04: qty 20

## 2015-11-04 MED ORDER — ONDANSETRON HCL 4 MG/2ML IJ SOLN: 4.0000 mg | Freq: Three times a day (TID) | INTRAMUSCULAR | Status: DC | PRN

## 2015-11-04 MED ORDER — MORPHINE SULFATE (PF) 2 MG/ML IV SOLN
1.0000 mg | INTRAVENOUS | Status: DC | PRN
  Administered 2015-11-04 – 2015-11-05 (×3): 2 mg via INTRAVENOUS
  Filled 2015-11-04 (×3): qty 1

## 2015-11-04 MED ORDER — IBUPROFEN 600 MG PO TABS: 600.0000 mg | ORAL_TABLET | Freq: Four times a day (QID) | ORAL | Status: DC | PRN

## 2015-11-04 MED ORDER — IOPAMIDOL (ISOVUE-300) INJECTION 61%
INTRAVENOUS | Status: AC
  Filled 2015-11-04: qty 50

## 2015-11-04 MED ORDER — BUPIVACAINE-EPINEPHRINE (PF) 0.25% -1:200000 IJ SOLN
INTRAMUSCULAR | Status: AC
  Filled 2015-11-04: qty 30

## 2015-11-04 MED ORDER — HYDROCODONE-ACETAMINOPHEN 5-325 MG PO TABS
1.0000 | ORAL_TABLET | ORAL | Status: DC | PRN
  Administered 2015-11-04 – 2015-11-05 (×2): 2 via ORAL
  Filled 2015-11-04 (×2): qty 2

## 2015-11-04 MED ORDER — MIDAZOLAM HCL 2 MG/2ML IJ SOLN
INTRAMUSCULAR | Status: AC
  Filled 2015-11-04: qty 2

## 2015-11-04 MED ORDER — PROPOFOL 10 MG/ML IV BOLUS
INTRAVENOUS | Status: DC | PRN
  Administered 2015-11-04: 150 mg via INTRAVENOUS

## 2015-11-04 MED ORDER — MIDAZOLAM HCL 5 MG/5ML IJ SOLN
INTRAMUSCULAR | Status: DC | PRN
  Administered 2015-11-04: 2 mg via INTRAVENOUS

## 2015-11-04 MED ORDER — SODIUM CHLORIDE 0.9 % IV SOLN
INTRAVENOUS | Status: DC | PRN
  Administered 2015-11-04: 15 mL

## 2015-11-04 MED ORDER — LIDOCAINE HCL (CARDIAC) 20 MG/ML IV SOLN
INTRAVENOUS | Status: DC | PRN
  Administered 2015-11-04: 100 mg via INTRAVENOUS

## 2015-11-04 SURGICAL SUPPLY — 50 items
APPLIER CLIP 5 13 M/L LIGAMAX5 (MISCELLANEOUS)
APPLIER CLIP ROT 10 11.4 M/L (STAPLE) ×3
BLADE SURG ROTATE 9660 (MISCELLANEOUS) IMPLANT
CANISTER SUCTION 2500CC (MISCELLANEOUS) ×3 IMPLANT
CHLORAPREP W/TINT 26ML (MISCELLANEOUS) ×3 IMPLANT
CHOLANGIOGRAM CATH TAUT (CATHETERS) ×3 IMPLANT
CLIP APPLIE 5 13 M/L LIGAMAX5 (MISCELLANEOUS) IMPLANT
CLIP APPLIE ROT 10 11.4 M/L (STAPLE) ×1 IMPLANT
COVER MAYO STAND STRL (DRAPES) ×3 IMPLANT
COVER SURGICAL LIGHT HANDLE (MISCELLANEOUS) ×3 IMPLANT
DERMABOND ADVANCED (GAUZE/BANDAGES/DRESSINGS) ×2
DERMABOND ADVANCED .7 DNX12 (GAUZE/BANDAGES/DRESSINGS) ×1 IMPLANT
DRAPE C-ARM 42X72 X-RAY (DRAPES) ×3 IMPLANT
ELECT REM PT RETURN 9FT ADLT (ELECTROSURGICAL) ×3
ELECTRODE REM PT RTRN 9FT ADLT (ELECTROSURGICAL) ×1 IMPLANT
FILTER SMOKE EVAC LAPAROSHD (FILTER) ×3 IMPLANT
GLOVE BIO SURGEON STRL SZ7 (GLOVE) ×3 IMPLANT
GLOVE BIO SURGEON STRL SZ8 (GLOVE) ×3 IMPLANT
GLOVE BIOGEL PI IND STRL 6.5 (GLOVE) ×1 IMPLANT
GLOVE BIOGEL PI IND STRL 7.0 (GLOVE) ×1 IMPLANT
GLOVE BIOGEL PI IND STRL 8 (GLOVE) ×1 IMPLANT
GLOVE BIOGEL PI INDICATOR 6.5 (GLOVE) ×2
GLOVE BIOGEL PI INDICATOR 7.0 (GLOVE) ×2
GLOVE BIOGEL PI INDICATOR 8 (GLOVE) ×2
GLOVE ECLIPSE 6.5 STRL STRAW (GLOVE) ×3 IMPLANT
GLOVE SURG SIGNA 7.5 PF LTX (GLOVE) ×3 IMPLANT
GOWN STRL REUS W/ TWL LRG LVL3 (GOWN DISPOSABLE) ×3 IMPLANT
GOWN STRL REUS W/ TWL XL LVL3 (GOWN DISPOSABLE) ×2 IMPLANT
GOWN STRL REUS W/TWL LRG LVL3 (GOWN DISPOSABLE) ×6
GOWN STRL REUS W/TWL XL LVL3 (GOWN DISPOSABLE) ×4
IV CATH 14GX2 1/4 (CATHETERS) ×3 IMPLANT
KIT BASIN OR (CUSTOM PROCEDURE TRAY) ×3 IMPLANT
KIT ROOM TURNOVER OR (KITS) ×3 IMPLANT
NS IRRIG 1000ML POUR BTL (IV SOLUTION) ×3 IMPLANT
PAD ARMBOARD 7.5X6 YLW CONV (MISCELLANEOUS) ×3 IMPLANT
POUCH SPECIMEN RETRIEVAL 10MM (ENDOMECHANICALS) ×3 IMPLANT
SCISSORS LAP 5X35 DISP (ENDOMECHANICALS) ×3 IMPLANT
SET IRRIG TUBING LAPAROSCOPIC (IRRIGATION / IRRIGATOR) ×3 IMPLANT
SLEEVE ENDOPATH XCEL 5M (ENDOMECHANICALS) ×3 IMPLANT
SPECIMEN JAR SMALL (MISCELLANEOUS) ×3 IMPLANT
STOPCOCK 4 WAY LG BORE MALE ST (IV SETS) ×3 IMPLANT
SUT MNCRL AB 4-0 PS2 18 (SUTURE) ×3 IMPLANT
TOWEL OR 17X24 6PK STRL BLUE (TOWEL DISPOSABLE) IMPLANT
TOWEL OR 17X26 10 PK STRL BLUE (TOWEL DISPOSABLE) ×3 IMPLANT
TRAY LAPAROSCOPIC MC (CUSTOM PROCEDURE TRAY) ×3 IMPLANT
TROCAR XCEL BLUNT TIP 100MML (ENDOMECHANICALS) ×3 IMPLANT
TROCAR XCEL NON-BLD 11X100MML (ENDOMECHANICALS) ×3 IMPLANT
TROCAR XCEL NON-BLD 5MMX100MML (ENDOMECHANICALS) ×3 IMPLANT
TUBING EXTENTION W/L.L. (IV SETS) ×3 IMPLANT
TUBING INSUFFLATION (TUBING) ×3 IMPLANT

## 2015-11-04 NOTE — Progress Notes (Signed)
Pt. Arrive to short stay  With cell phone, head wrap, underware, and contacts. Notified the floor where pt. Came from. ([address]) Luke, nt picked up belongings.

## 2015-11-04 NOTE — Anesthesia Procedure Notes (Signed)
Procedure Name: Intubation Date/Time: 11/04/2015 11:47 AM Performed by: Arlice ColtMANESS, Damico Partin B Pre-anesthesia Checklist: Patient identified, Emergency Drugs available, Suction available, Patient being monitored and Timeout performed Patient Re-evaluated:Patient Re-evaluated prior to inductionOxygen Delivery Method: Circle system utilized Preoxygenation: Pre-oxygenation with 100% oxygen Intubation Type: IV induction Ventilation: Mask ventilation without difficulty Laryngoscope Size: Mac and 3 Grade View: Grade I Tube type: Oral Tube size: 7.0 mm Number of attempts: 1 Airway Equipment and Method: Stylet Placement Confirmation: ETT inserted through vocal cords under direct vision,  positive ETCO2 and breath sounds checked- equal and bilateral Secured at: 21 cm Tube secured with: Tape Dental Injury: Teeth and Oropharynx as per pre-operative assessment

## 2015-11-04 NOTE — Progress Notes (Signed)
Dr. Lindie SpruceWyatt notified patient has arrived to floor.

## 2015-11-04 NOTE — H&P (Signed)
[name] [name] is an 25 y.o. female.   Chief Complaint: Abdominal pain in the RUQ HPI: Has had symtpoms since delivering hs last child which is about 1 year ago.  Has seen surgeon in the office, but in the process of scheduling when her symptoms became more severe and she came to the ED.  Past Medical History:  Diagnosis Date  . Bipolar 1 disorder ([place])   . Depression   . Gallstones and inflammation of gallbladder with obstruction 10/19/2015  . Suicidal overdose [facility])     Past Surgical History:  Procedure Laterality Date  . NO PAST SURGERIES      Family History  Problem Relation Age of Onset  . Heart attack Mother   . Stomach cancer Mother   . Hypertension Father    Social History:  reports that she has quit smoking. Her smoking use included Cigarettes. She has a 0.50 pack-year smoking history. She has never used smokeless tobacco. She reports that she drinks about 0.6 oz of alcohol per week . She reports that she uses drugs, including Marijuana.  Allergies: No Known Allergies  Medications Prior to Admission  Medication Sig Dispense Refill  . naproxen (NAPROSYN) 500 MG tablet Take 1 tablet (500 mg total) by mouth 2 (two) times daily. 30 tablet 0  . oxyCODONE-acetaminophen (PERCOCET/ROXICET) 5-325 MG tablet Take 1 tablet by mouth every 4 (four) hours as needed for severe pain. 12 tablet 0  . HYDROcodone-acetaminophen (HYCET) 7.5-325 mg/15 ml solution Take 15 mLs by mouth every 8 (eight) hours as needed for moderate pain. (Patient not taking: Reported on 11/03/2015) 120 mL 0  . ibuprofen (ADVIL,MOTRIN) 600 MG tablet Take 1 tablet (600 mg total) by mouth every 6 (six) hours. (Patient not taking: Reported on 11/03/2015) 30 tablet 0  . loperamide (IMODIUM) 2 MG capsule Take 1 capsule (2 mg total) by mouth 4 (four) times daily as needed for diarrhea or loose stools. (Patient not taking: Reported on 11/03/2015) 12 capsule 0  . ondansetron (ZOFRAN) 4 MG tablet Take 1 tablet (4 mg total) by  mouth every 6 (six) hours. (Patient not taking: Reported on 11/03/2015) 12 tablet 0  . promethazine (PHENERGAN) 25 MG tablet Take 1 tablet (25 mg total) by mouth every 6 (six) hours as needed for nausea or vomiting. (Patient not taking: Reported on 11/03/2015) 12 tablet 0    Results for orders placed or performed during the hospital encounter of 11/03/15 (from the past 48 hour(s))  Lipase, blood     Status: None   Collection Time: 11/03/15  5:15 PM  Result Value Ref Range   Lipase 19 11 - 51 U/L  Comprehensive metabolic panel     Status: None   Collection Time: 11/03/15  5:15 PM  Result Value Ref Range   Sodium 137 135 - 145 mmol/L   Potassium 3.7 3.5 - 5.1 mmol/L   Chloride 105 101 - 111 mmol/L   CO2 23 22 - 32 mmol/L   Glucose, Bld 98 65 - 99 mg/dL   BUN 13 6 - 20 mg/dL   Creatinine, Ser 0.73 0.44 - 1.00 mg/dL   Calcium 9.5 8.9 - 10.3 mg/dL   Total Protein 7.6 6.5 - 8.1 g/dL   Albumin 4.4 3.5 - 5.0 g/dL   AST 16 15 - 41 U/L   ALT 17 14 - 54 U/L   Alkaline Phosphatase 51 38 - 126 U/L   Total Bilirubin 0.6 0.3 - 1.2 mg/dL   GFR calc non Af Amer >  60 >60 mL/min   GFR calc Af Amer >60 >60 mL/min    Comment: (NOTE) The eGFR has been calculated using the CKD EPI equation. This calculation has not been validated in all clinical situations. eGFR's persistently <60 mL/min signify possible Chronic Kidney Disease.    Anion gap 9 5 - 15  CBC     Status: None   Collection Time: 11/03/15  5:15 PM  Result Value Ref Range   WBC 6.8 4.0 - 10.5 K/uL   RBC 4.55 3.87 - 5.11 MIL/uL   Hemoglobin 12.9 12.0 - 15.0 g/dL   HCT 39.6 36.0 - 46.0 %   MCV 87.0 78.0 - 100.0 fL   MCH 28.4 26.0 - 34.0 pg   MCHC 32.6 30.0 - 36.0 g/dL   RDW 12.9 11.5 - 15.5 %   Platelets 331 150 - 400 K/uL  I-Stat beta hCG blood, ED     Status: None   Collection Time: 11/03/15  6:13 PM  Result Value Ref Range   I-stat hCG, quantitative <5.0 <5 mIU/mL   Comment 3            Comment:   GEST. AGE      CONC.   (mIU/mL)   <=1 WEEK        5 - 50     2 WEEKS       50 - 500     3 WEEKS       100 - 10,000     4 WEEKS     1,000 - 30,000        FEMALE AND NON-PREGNANT FEMALE:     LESS THAN 5 mIU/mL   Urinalysis, Routine w reflex microscopic     Status: Abnormal   Collection Time: 11/03/15  6:26 PM  Result Value Ref Range   Color, Urine YELLOW YELLOW   APPearance HAZY (A) CLEAR   Specific Gravity, Urine 1.035 (H) 1.005 - 1.030   pH 5.5 5.0 - 8.0   Glucose, UA NEGATIVE NEGATIVE mg/dL   Hgb urine dipstick NEGATIVE NEGATIVE   Bilirubin Urine SMALL (A) NEGATIVE   Ketones, ur NEGATIVE NEGATIVE mg/dL   Protein, ur NEGATIVE NEGATIVE mg/dL   Nitrite NEGATIVE NEGATIVE   Leukocytes, UA NEGATIVE NEGATIVE    Comment: MICROSCOPIC NOT DONE ON URINES WITH NEGATIVE PROTEIN, BLOOD, LEUKOCYTES, NITRITE, OR GLUCOSE <1000 mg/dL.   No results found.  Review of Systems  Constitutional: Negative for chills and fever.  Gastrointestinal: Positive for abdominal pain, nausea and vomiting.  All other systems reviewed and are negative.   Blood pressure 126/73, pulse 63, temperature 98.6 F (37 C), temperature source Oral, resp. rate 18, height 5' 6"  (1.676 m), weight 94 kg (207 lb 4.8 oz), last menstrual period 10/25/2015, SpO2 99 %, unknown if currently breastfeeding. Physical Exam  Constitutional: She is oriented to person, place, and time. She appears well-developed and well-nourished.  Overweight  HENT:  Head: Normocephalic and atraumatic.  Eyes: Conjunctivae and EOM are normal. Pupils are equal, round, and reactive to light. No scleral icterus.  Neck: Normal range of motion. Neck supple.  Cardiovascular: Normal rate and regular rhythm.   Respiratory: Effort normal and breath sounds normal.  GI: Soft. Normal appearance and bowel sounds are normal. She exhibits no distension. There is tenderness in the right upper quadrant. There is no rigidity, no rebound and no guarding.  Musculoskeletal: Normal range of  motion.  Neurological: She is alert and oriented to person, place, and time. She has  normal reflexes.  Skin: Skin is warm and dry.  Psychiatric: She has a normal mood and affect. Her behavior is normal. Judgment and thought content normal.     Assessment/Plan Symptomatic cholelithiasis, biliary colic  Admit, hydrate, IV antibiotics, surgery within 24 hours.  Judeth Horn, MD 11/04/2015, 5:05 AM

## 2015-11-04 NOTE — Progress Notes (Signed)
CCS Md paged to notify patient arrival to unit

## 2015-11-04 NOTE — ED Notes (Signed)
Pt and family made aware of bed assignment 

## 2015-11-04 NOTE — Anesthesia Preprocedure Evaluation (Addendum)
Anesthesia Evaluation  Patient identified by MRN, date of birth, ID band Patient awake    Reviewed: Allergy & Precautions, NPO status , Patient's Chart, lab work & pertinent test results  Airway Mallampati: II  TM Distance: >3 FB Neck ROM: Full    Dental  (+) Teeth Intact, Dental Advisory Given, Chipped,    Pulmonary former smoker,    Pulmonary exam normal breath sounds clear to auscultation       Cardiovascular negative cardio ROS Normal cardiovascular exam Rhythm:Regular Rate:Normal     Neuro/Psych PSYCHIATRIC DISORDERS Depression Bipolar Disorder negative neurological ROS     GI/Hepatic negative GI ROS, Cholecystitis   Endo/Other  Obesity   Renal/GU negative Renal ROS     Musculoskeletal negative musculoskeletal ROS (+)   Abdominal   Peds  Hematology negative hematology ROS (+)   Anesthesia Other Findings Day of surgery medications reviewed with the patient.  Reproductive/Obstetrics negative OB ROS                            Anesthesia Physical Anesthesia Plan  ASA: II  Anesthesia Plan: General   Post-op Pain Management:    Induction: Intravenous  Airway Management Planned: Oral ETT  Additional Equipment:   Intra-op Plan:   Post-operative Plan: Extubation in OR  Informed Consent: I have reviewed the patients History and Physical, chart, labs and discussed the procedure including the risks, benefits and alternatives for the proposed anesthesia with the patient or authorized representative who has indicated his/her understanding and acceptance.   Dental advisory given  Plan Discussed with: CRNA  Anesthesia Plan Comments: (Risks/benefits of general anesthesia discussed with patient including risk of damage to teeth, lips, gum, and tongue, nausea/vomiting, allergic reactions to medications, and the possibility of heart attack, stroke and death.  All patient questions  answered.  Patient wishes to proceed.)        Anesthesia Quick Evaluation

## 2015-11-04 NOTE — Progress Notes (Signed)
Received call from Short Stay.  Patient had her contacts, cell phone, panties & head wrap. Patient was instructed to have everything removed and a denture cup was provided for patient to put earring & tongue ring in. Patient was informed that they only thing she could have on was a gown and socks.

## 2015-11-04 NOTE — Transfer of Care (Signed)
Immediate Anesthesia Transfer of Care Note  Patient: [name] [name]  Procedure(s) Performed: Procedure(s): LAPAROSCOPIC CHOLECYSTECTOMY WITH INTRAOPERATIVE CHOLANGIOGRAM (N/A)  Patient Location: PACU  Anesthesia Type:General  Level of Consciousness: awake, alert  and oriented  Airway & Oxygen Therapy: Patient Spontanous Breathing  Post-op Assessment: Report given to RN and Post -op Vital signs reviewed and stable  Post vital signs: Reviewed and stable  Last Vitals:  Vitals:   11/04/15 0220 11/04/15 0540  BP: 126/73 117/68  Pulse: 63 (!) 55  Resp: 18 16  Temp: 37 C 36.5 C    Last Pain:  Vitals:   11/04/15 0540  TempSrc: Oral  PainSc:          Complications: No apparent anesthesia complications

## 2015-11-04 NOTE — Progress Notes (Signed)
I have discussed Ms. Vandenbos with Dr. Lindie SpruceWyatt.  I have examined the patient. She has been seen by Dr. Luisa Hartornett in our office for her gall bladder disease on 10/25/2015.  I discussed with the patient the indications and risks of gall bladder surgery.  The primary risks of gall bladder surgery include, but are not limited to, bleeding, infection, common bile duct injury, and open surgery.  There is also the risk that the patient may have continued symptoms after surgery.  We discussed the typical post-operative recovery course. I tried to answer the patient's questions.  Ovidio Kinavid Peggi Yono, MD, [facility] Pager: [phone] Office phone:  [phone]

## 2015-11-04 NOTE — Progress Notes (Signed)
Initial Nutrition Assessment  DOCUMENTATION CODES:   Obesity unspecified  INTERVENTION:  Provide Boost Breeze po TID, each supplement provides 250 kcal and 9 grams of protein.  NUTRITION DIAGNOSIS:   Increased nutrient needs related to  (post op healing) as evidenced by estimated needs.  GOAL:   Patient will meet greater than or equal to 90% of their needs  MONITOR:   PO intake, Supplement acceptance, Diet advancement, Labs, Weight trends, Skin, I & O's  REASON FOR ASSESSMENT:   Malnutrition Screening Tool    ASSESSMENT:   25 y.o. female with a PMHx of asthma, who presents to the Emergency Department complaining of constant, dull right-sided upper abdominal pain onset ~2.5 months ago, worsening over the past ~3 weeks. She states that her pain is now radiating into her back. She reports associated subjective fever and diarrhea secondary to her abdominal pain. Pt was seen in the ED on 10/19/15 and at that time she was dx'd w/ Gallstones confirmed via US study and advised to f/u w/ surgery for removal.  Procedure(10/19):  LAPAROSCOPIC CHOLECYSTECTOMY WITH INTRAOPERATIVE CHOLANGIOGRAM  Pt was in OR during time of visit during attempted time of visit. RD unable to obtain nutrition history. Diet has just been advanced to a clear liquid diet. RD to order Boost Breeze to aid in caloric and protein needs. Unable to complete Nutrition-Focused physical exam at this time. RD to perform at next visit.   Labs and medications reviewed.   Diet Order:  Diet clear liquid Room service appropriate? Yes; Fluid consistency: Thin  Skin:   (Incision on abdomen)  Last BM:  10/16  Height:   Ht Readings from Last 1 Encounters:  11/04/15 5\' 6"  (1.676 m)    Weight:   Wt Readings from Last 1 Encounters:  11/04/15 207 lb 4.8 oz (94 kg)    Ideal Body Weight:  59 kg  BMI:  Body mass index is 33.46 kg/m.  Estimated Nutritional Needs:   Kcal:  2000-2300  Protein:  100-115 grams  Fluid:   2-2.2 L/day  EDUCATION NEEDS:   No education needs identified at this time  Roslyn SmilingStephanie Daimian Sudberry, MS, RD, LDN Pager # [phone] After hours/ weekend pager # [phone]

## 2015-11-04 NOTE — Anesthesia Postprocedure Evaluation (Signed)
Anesthesia Post Note  Patient: Alinah Sharpless  Procedure(s) Performed: Procedure(s) (LRB): LAPAROSCOPIC CHOLECYSTECTOMY WITH INTRAOPERATIVE CHOLANGIOGRAM (N/A)  Patient location during evaluation: PACU Anesthesia Type: General Level of consciousness: awake and alert Pain management: pain level controlled Vital Signs Assessment: post-procedure vital signs reviewed and stable Respiratory status: spontaneous breathing, nonlabored ventilation, respiratory function stable and patient connected to nasal cannula oxygen Cardiovascular status: blood pressure returned to baseline and stable Postop Assessment: no signs of nausea or vomiting Anesthetic complications: no    Last Vitals:  Vitals:   11/04/15 1400 11/04/15 1547  BP: 111/78 122/75  Pulse: (!) 55 74  Resp: (!) 21 20  Temp: 36.6 C 36.7 C    Last Pain:  Vitals:   11/04/15 1547  TempSrc: Oral  PainSc:                  Cecile HearingStephen Edward Shaunta Oncale

## 2015-11-04 NOTE — Op Note (Addendum)
11/03/2015 - 11/04/2015  1:04 PM  PATIENT:  [name] CoombesJustina [name], 25 y.o., female, MRN: [id number]  PREOP DIAGNOSIS:  SYMPTOMATIC CHOLELITHIASIS  POSTOP DIAGNOSIS:   Acute edematous cholecystitis, cholelithiasis.  PROCEDURE:   Procedure(s):  LAPAROSCOPIC CHOLECYSTECTOMY WITH INTRAOPERATIVE CHOLANGIOGRAM  SURGEON:   Ovidio Kinavid Gamal Todisco, M.D.  ASSISTANT:   B. Janee Mornhompson, M.D.  ANESTHESIA:   general  Anesthesiologist: Cecile HearingStephen Edward Turk, MD CRNA: Sheppard Evensarrie B Maness, CRNA  General  ASA: 2E  EBL:  Minimal  ml  BLOOD ADMINISTERED: none  DRAINS: none   LOCAL MEDICATIONS USED:   30 cc 1/4% marcaine  SPECIMEN:   Gall bladder  COUNTS CORRECT:  YES  INDICATIONS FOR PROCEDURE:  [name] PrestoJustina [name] is a 25 y.o. (DOB: 09/28/1990) AA female whose primary care physician is No PCP Per Patient and comes for cholecystectomy.   The indications and risks of the gall bladder surgery were explained to the patient.  The risks include, but are not limited to, infection, bleeding, common bile duct injury and open surgery.  SURGERY:  The [facility]  The abdomen was prepped with chloroprep.  The patient was given 2 gm Ancef at the beginning of the operation.   A time out was held and the surgical checklist run.   An infraumbilical incision was made into the abdominal cavity.  A 12 mm Hasson trocar was inserted into the abdominal cavity through the infraumbilical incision and secured with a 0 Vicryl suture.  Three additional trocars were inserted: a 10 mm trocar in the sub-xiphoid location, a 5 mm trocar in the right mid subcostal area, and a 5 mm trocar in the right lateral subcostal area.   The abdomen was explored and the liver, stomach, and bowel that could be seen were unremarkable.   The gall bladder was edematous, consistent with acute cholecystitis.   I grasped the gall bladder and rotated it cephalad.  Disssection was carried down to the gall bladder/cystic duct junction  and the cystic duct isolated.  A clip was placed on the gall bladder side of the cystic duct.   An intra-operative cholangiogram was shot.   The intra-operative cholangiogram was shot using a cut off Taut catheter placed through a 14 gauge angiocath in the RUQ.  The Taut catheter was inserted in the cut cystic duct and secured with an endoclip.  A cholangiogram was shot with 15 cc of 1/2 strength Isoview.  Using fluoroscopy, the cholangiogram showed the flow of contrast into the common bile duct, up the hepatic radicals, and into the duodenum.  There was no mass or obstruction.  This was a normal intra-operative cholangiogram.   The Taut catheter was removed.  The cystic duct was tripley endoclipped and the cystic artery was identified and clipped.  The gall bladder was bluntly and sharpley dissected from the gall bladder bed.   After the gall bladder was removed from the liver, the gall bladder bed and Triangle of [place] were inspected.  There was no bleeding or bile leak.  The gall bladder was placed in a endocatch bag and delivered through the umbilicus.  The abdomen was irrigated with 800 cc saline.   The trocars were then removed.  I infiltrated 30 cc of 1/4% Marcaine into the incisions.  The umbilical port closed with a 0 Vicryl suture and the skin closed with 4-0 Monocryl.  The skin was painted with DermaBond.  The patient's sponge and needle count were correct.  The patient was transported  to the RR in good condition.  I have a surgeon, Dr. Janee Morn, as a first assist to retract, expose, and assist on this difficult operation.  Ovidio Kin, MD, [facility] Pager: [phone] Office phone:  [phone]

## 2015-11-04 NOTE — Discharge Instructions (Signed)

## 2015-11-05 ENCOUNTER — Encounter (HOSPITAL_COMMUNITY): Payer: Self-pay | Admitting: Surgery

## 2015-11-05 ENCOUNTER — Encounter (INDEPENDENT_AMBULATORY_CARE_PROVIDER_SITE_OTHER): Payer: Self-pay | Admitting: Physician Assistant

## 2015-11-05 MED ORDER — HYDROCODONE-ACETAMINOPHEN 5-325 MG PO TABS: 1.0000 | ORAL_TABLET | ORAL | 0 refills | Status: DC | PRN

## 2015-11-05 NOTE — Care Management Note (Signed)
Case Management Note  Patient Details  Name: [name] CoombesJustina Torre MRN: [id number] Date of Birth: 12/27/1990  Subjective/Objective:      CM following for progression and d/c planning.               Action/Plan: 11/05/2015 Pt for d/c today, no d/c needs identified.   Expected Discharge Date:    11/05/2015              Expected Discharge Plan:  Home/Self Care  In-House Referral:  NA  Discharge planning Services  NA  Post Acute Care Choice:  NA Choice offered to:  NA  DME Arranged:  N/A DME Agency:  NA  HH Arranged:  NA HH Agency:  NA  Status of Service:  Completed, signed off  If discussed at Long Length of Stay Meetings, dates discussed:    Additional Comments:  Starlyn SkeansRoyal, Winfred Iiams U, RN 11/05/2015, 11:40 AM

## 2015-11-05 NOTE — Progress Notes (Signed)
Patient discharged to home with friend. IV removed. All discharged instructions reviewed. Script and worknote given. Patient dressed and left unit in stable condition.  Avelina LaineKimberly Moise Friday RN

## 2015-11-05 NOTE — Progress Notes (Signed)
Patient ID: [name] CoombesJustina [name], female   DOB: 10/20/1990, 25 y.o.   MRN: [id number]  [facility]  1 Day Post-Op  Subjective: Smiling and sitting in bed. States that she is still in a lot of pain. Passing flatus. She has ambulated. Eating but not very much. No n/v.  Objective: Vital signs in last 24 hours: Temp:  [97.8 F (36.6 C)-98.6 F (37 C)] 98.6 F (37 C) (10/20 1046) Pulse Rate:  [55-74] 72 (10/20 1046) Resp:  [10-21] 18 (10/20 1046) BP: (97-127)/(67-95) 127/95 (10/20 1046) SpO2:  [97 %-100 %] 100 % (10/20 1046) Weight:  [206 lb 3.2 oz (93.5 kg)] 206 lb 3.2 oz (93.5 kg) (10/19 2217) Last BM Date: 11/01/15  Intake/Output from previous day: 10/19 0701 - 10/20 0700 In: 2696.2 [P.O.:602; I.V.:2094.2] Out: 770 [Urine:750; Blood:20] Intake/Output this shift: Total I/O In: 240 [P.O.:240] Out: -   PE: Gen:  Alert, NAD, pleasant Pulm: effort normal Abd: Soft, ND, +BS, appropriately tender, incisions C/D/I  Lab Results:   Recent Labs  11/03/15 1715  WBC 6.8  HGB 12.9  HCT 39.6  PLT 331   BMET  Recent Labs  11/03/15 1715  NA 137  K 3.7  CL 105  CO2 23  GLUCOSE 98  BUN 13  CREATININE 0.73  CALCIUM 9.5   PT/INR No results for input(s): LABPROT, INR in the last 72 hours. CMP     Component Value Date/Time   NA 137 11/03/2015 1715   K 3.7 11/03/2015 1715   CL 105 11/03/2015 1715   CO2 23 11/03/2015 1715   GLUCOSE 98 11/03/2015 1715   BUN 13 11/03/2015 1715   CREATININE 0.73 11/03/2015 1715   CALCIUM 9.5 11/03/2015 1715   PROT 7.6 11/03/2015 1715   ALBUMIN 4.4 11/03/2015 1715   AST 16 11/03/2015 1715   ALT 17 11/03/2015 1715   ALKPHOS 51 11/03/2015 1715   BILITOT 0.6 11/03/2015 1715   GFRNONAA >60 11/03/2015 1715   GFRAA >60 11/03/2015 1715   Lipase     Component Value Date/Time   LIPASE 19 11/03/2015 1715       Studies/Results: Dg Cholangiogram Operative  Result Date: 11/04/2015 CLINICAL DATA:  Cholelithiasis.  EXAM: INTRAOPERATIVE CHOLANGIOGRAM TECHNIQUE: Cholangiographic images from the C-arm fluoroscopic device were submitted for interpretation post-operatively. Please see the procedural report for the amount of contrast and the fluoroscopy time utilized. COMPARISON:  Ultrasound 10/19/2015 FINDINGS: Contrast opacifies the extrahepatic biliary system and drains rapidly into the duodenum. There is minor filling of the central intrahepatic bile ducts. There is a questionable filling defect in the common bile duct which could be related to overlying structures. Cannot exclude a nonobstructive stone. IMPRESSION: Patent biliary system. Cannot exclude a nonobstructive stone in the common bile duct as described. Electronically Signed   By: Richarda OverlieAdam  Henn M.D.   On: 11/04/2015 14:32    Anti-infectives: Anti-infectives    Start     Dose/Rate Route Frequency Ordered Stop   11/04/15 0800  ceFAZolin (ANCEF) IVPB 2g/100 mL premix     2 g 200 mL/hr over 30 Minutes Intravenous To Short Stay 11/04/15 0318 11/04/15 1150   11/04/15 0015  cefTRIAXone (ROCEPHIN) 2 g in dextrose 5 % 50 mL IVPB     2 g 100 mL/hr over 30 Minutes Intravenous  Once 11/04/15 0014 11/04/15 0427       Assessment/Plan Procedure(s):  LAPAROSCOPIC CHOLECYSTECTOMY WITH INTRAOPERATIVE CHOLANGIOGRAM 11/04/15 Dr. Ezzard StandingNewman - POD 1 - still sore but stable. Passing gas, has  ambulated, taking PO pain medication  FEN - regular  Plan - discharge later today as long as tolerating diet.   She does not need antibiotics.   She will follow-up with Dr. Ezzard Standing in 3 weeks   LOS: 0 days    Edson Snowball , [facility] Surgery 11/05/2015, 10:58 AM Pager: [phone] Consults: [phone] Mon-Fri 7:00 am-4:30 pm Sat-Sun 7:00 am-11:30 am  Agree with above.  Ovidio Kin, MD, [facility] Pager: [phone] Office phone:  [phone]

## 2015-11-08 NOTE — Discharge Summary (Signed)
  [facility] Discharge Summary   Patient ID: [name] [name] MRN: [id number] DOB/AGE: 24/06/1990 24 y.o.  Admit date: 11/03/2015 Discharge date: 11/05/2015  Admitting Diagnosis: Calculous of gallbladder Cholecystitis  Discharge Diagnosis Patient Active Problem List   Diagnosis Date Noted  . Cholelithiasis 11/04/2015  . Hypertension affecting pregnancy 08/06/2014  . Pregnancy induced hypertension, antepartum 08/06/2014  . Environmental allergies 05/28/2014  . UTI (urinary tract infection) in pregnancy in second trimester 03/08/2014  . Supervision of normal pregnancy 03/04/2014  . Depression 04/29/2012    Consultants None  Imaging: US abdomen limited RUQ 10/19/15: Cholelithiasis. Gallbladder wall thickening is nonspecific because the gallbladder is decompressed. No biliary dilatation.  DG choleangiogram operative 11/04/15: Patent biliary system. Cannot exclude a nonobstructive stone in the common bile duct as described.  Procedures Dr. Ezzard StandingNewman (11/04/15) - [facility]  Hospital Course:  [name] [name] is a 25yo female who [facility] with worsening RUQ abdominal pain. Her symptoms had started about 1 year ago, but in the process of scheduling her for surgery her symptoms became more severe.  Workup showed normal LFT's and a normal WBC.  She had a RUQ abdominal u/s previously on 10/19/15 which showed cholelithiasis and nonspecific gallbladder wall thickening. Patient was admitted and underwent procedure listed above.  Tolerated procedure well and was transferred to the floor.  Diet was advanced as tolerated.  On POD1 the patient was voiding well, tolerating diet, ambulating well, pain well controlled, vital signs stable, incisions c/d/i and felt stable for discharge home.  Patient will follow up in our office in 3 weeks and knows to call with questions or concerns.  She will call to confirm appointment date/time.    Physical  Exam: Gen:  Alert, NAD, pleasant Pulm: effort normal Abd: Soft, ND, +BS, appropriately tender, incisions C/D/I    Medication List    STOP taking these medications   HYDROcodone-acetaminophen 7.5-325 mg/15 ml solution Commonly known as:  HYCET Replaced by:  HYDROcodone-acetaminophen 5-325 MG tablet   ondansetron 4 MG tablet Commonly known as:  ZOFRAN   oxyCODONE-acetaminophen 5-325 MG tablet Commonly known as:  PERCOCET/ROXICET   promethazine 25 MG tablet Commonly known as:  PHENERGAN     TAKE these medications   HYDROcodone-acetaminophen 5-325 MG tablet Commonly known as:  NORCO/VICODIN Take 1 tablet by mouth every 4 (four) hours as needed for moderate pain. Replaces:  HYDROcodone-acetaminophen 7.5-325 mg/15 ml solution   ibuprofen 600 MG tablet Commonly known as:  ADVIL,MOTRIN Take 1 tablet (600 mg total) by mouth every 6 (six) hours.   loperamide 2 MG capsule Commonly known as:  IMODIUM Take 1 capsule (2 mg total) by mouth 4 (four) times daily as needed for diarrhea or loose stools.   naproxen 500 MG tablet Commonly known as:  NAPROSYN Take 1 tablet (500 mg total) by mouth 2 (two) times daily.        Follow-up Information    [facility] Levitz [facility].   Specialty:  General Surgery Why:  Please call to schedule appointment , If symptoms worsen. Thank you.  Contact information: [address] [place] [address] [phone]           Signed: Edson SnowballBROOKE A MILLER, [facility] 11/08/2015, 2:18 PM Pager: [phone] Consults: [phone] Mon-Fri 7:00 am-4:30 pm Sat-Sun 7:00 am-11:30 am  Agree with above.  Ovidio Kinavid Yuritzy Zehring, MD, [facility] Pager: [phone] Office phone:  [phone]

## 2015-12-21 DIAGNOSIS — R51 Headache: Secondary | ICD-10-CM | POA: Diagnosis not present

## 2015-12-21 DIAGNOSIS — R42 Dizziness and giddiness: Secondary | ICD-10-CM | POA: Diagnosis present

## 2015-12-21 DIAGNOSIS — Z87891 Personal history of nicotine dependence: Secondary | ICD-10-CM | POA: Diagnosis not present

## 2015-12-22 ENCOUNTER — Encounter (HOSPITAL_COMMUNITY): Payer: Self-pay | Admitting: Emergency Medicine

## 2015-12-22 ENCOUNTER — Emergency Department (HOSPITAL_COMMUNITY): Payer: Medicaid Other

## 2015-12-22 ENCOUNTER — Emergency Department (HOSPITAL_COMMUNITY)
Admission: EM | Admit: 2015-12-22 | Discharge: 2015-12-22 | Disposition: A | Discharge status: Home / Self Care | Payer: Medicaid Other | Attending: Emergency Medicine | Admitting: Emergency Medicine

## 2015-12-22 DIAGNOSIS — R519 Headache, unspecified: Secondary | ICD-10-CM

## 2015-12-22 DIAGNOSIS — R51 Headache: Secondary | ICD-10-CM

## 2015-12-22 LAB — BASIC METABOLIC PANEL
ANION GAP: 11 (ref 5–15)
BUN: 14 mg/dL (ref 6–20)
CO2: 22 mmol/L (ref 22–32)
Calcium: 9.4 mg/dL (ref 8.9–10.3)
Chloride: 104 mmol/L (ref 101–111)
Creatinine, Ser: 0.79 mg/dL (ref 0.44–1.00)
GLUCOSE: 81 mg/dL (ref 65–99)
POTASSIUM: 3.7 mmol/L (ref 3.5–5.1)
Sodium: 137 mmol/L (ref 135–145)

## 2015-12-22 LAB — CBC
HEMATOCRIT: 35.6 % — AB (ref 36.0–46.0)
Hemoglobin: 11.7 g/dL — ABNORMAL LOW (ref 12.0–15.0)
MCH: 29.1 pg (ref 26.0–34.0)
MCHC: 32.9 g/dL (ref 30.0–36.0)
MCV: 88.6 fL (ref 78.0–100.0)
PLATELETS: 326 10*3/uL (ref 150–400)
RBC: 4.02 MIL/uL (ref 3.87–5.11)
RDW: 13.4 % (ref 11.5–15.5)
WBC: 6.6 10*3/uL (ref 4.0–10.5)

## 2015-12-22 LAB — I-STAT BETA HCG BLOOD, ED (MC, WL, AP ONLY)

## 2015-12-22 MED ORDER — METOCLOPRAMIDE HCL 5 MG/ML IJ SOLN
10.0000 mg | Freq: Once | INTRAMUSCULAR | Status: AC
  Administered 2015-12-22: 10 mg via INTRAVENOUS
  Filled 2015-12-22: qty 2

## 2015-12-22 MED ORDER — DIPHENHYDRAMINE HCL 50 MG/ML IJ SOLN
25.0000 mg | Freq: Once | INTRAMUSCULAR | Status: AC
  Administered 2015-12-22: 25 mg via INTRAVENOUS
  Filled 2015-12-22: qty 1

## 2015-12-22 MED ORDER — KETOROLAC TROMETHAMINE 30 MG/ML IJ SOLN
30.0000 mg | Freq: Once | INTRAMUSCULAR | Status: AC
  Administered 2015-12-22: 30 mg via INTRAVENOUS
  Filled 2015-12-22: qty 1

## 2015-12-22 MED ORDER — DEXAMETHASONE SODIUM PHOSPHATE 10 MG/ML IJ SOLN
10.0000 mg | Freq: Once | INTRAMUSCULAR | Status: AC
  Administered 2015-12-22: 10 mg via INTRAVENOUS
  Filled 2015-12-22: qty 1

## 2015-12-22 MED ORDER — SODIUM CHLORIDE 0.9 % IV BOLUS (SEPSIS)
1000.0000 mL | Freq: Once | INTRAVENOUS | Status: AC
  Administered 2015-12-22: 1000 mL via INTRAVENOUS

## 2015-12-22 MED ORDER — METOCLOPRAMIDE HCL 10 MG PO TABS
10.0000 mg | ORAL_TABLET | Freq: Four times a day (QID) | ORAL | 0 refills | Status: DC | PRN
Start: 2015-12-22 — End: 2016-07-03

## 2015-12-22 NOTE — ED Provider Notes (Signed)
[facility]-EMERGENCY DEPT Provider Note   CSN: [id number] Arrival date & time: 12/21/15  2352  By signing my name below, I, Emmanuella Mensah, attest that this documentation has been prepared under the direction and in the presence of Dione Booze, MD. Electronically Signed: Angelene Giovanni, ED Scribe. 12/22/15. 3:04 AM.   History   Chief Complaint Chief Complaint  Patient presents with  . Dizziness  . Migraine    HPI Comments: [name] [name] is a 25 y.o. female with a hx of migraines who presents to the Emergency Department complaining of gradually worsening constant 8/10 occipital headache she describes as throbbing onset 2 weeks ago. She reports associated blurred vision in left eye, nausea, and persistent low grade fevers (highest 100 PTA). She states that this headache is different from her previous migraines because it is in her occipital region and she has blurred vision. No alleviating or exacerbating factors noted. Pt wears colored medicated contact lenses. She reports a hx of cholecystectomy one month ago and states that she tried the Percocet prescribed with no relief of her headache. Pt has NKDA. She denies any chills, vomiting, neck pain, neck stiffness, or any other symptoms.   The history is provided by the patient. No language interpreter was used.  Migraine  This is a recurrent problem. The current episode started more than 1 week ago. The problem has been gradually worsening. Associated symptoms include headaches. Nothing aggravates the symptoms. Nothing relieves the symptoms. Treatments tried: Percocet. The treatment provided no relief.    Past Medical History:  Diagnosis Date  . Bipolar 1 disorder (HCC)   . Depression   . Gallstones and inflammation of gallbladder with obstruction 10/19/2015  . Suicidal overdose [facility])     Patient Active Problem List   Diagnosis Date Noted  . Cholelithiasis 11/04/2015  . Hypertension affecting pregnancy 08/06/2014  . Pregnancy  induced hypertension, antepartum 08/06/2014  . Environmental allergies 05/28/2014  . UTI (urinary tract infection) in pregnancy in second trimester 03/08/2014  . Supervision of normal pregnancy 03/04/2014  . Depression 04/29/2012    Past Surgical History:  Procedure Laterality Date  . CHOLECYSTECTOMY N/A 11/04/2015   Procedure: LAPAROSCOPIC CHOLECYSTECTOMY WITH INTRAOPERATIVE CHOLANGIOGRAM;  Surgeon: Ovidio Kin, MD;  Location: [facility] OR;  Service: General;  Laterality: N/A;  . NO PAST SURGERIES      OB History    Gravida Para Term Preterm AB Living   2 1 1  0 0 1   SAB TAB Ectopic Multiple Live Births   0 0 0 0 1       Home Medications    Prior to Admission medications   Medication Sig Start Date End Date Taking? Authorizing Provider  HYDROcodone-acetaminophen (NORCO/VICODIN) 5-325 MG tablet Take 1 tablet by mouth every 4 (four) hours as needed for moderate pain. 11/05/15   Edson Snowball, PA-C  ibuprofen (ADVIL,MOTRIN) 600 MG tablet Take 1 tablet (600 mg total) by mouth every 6 (six) hours. Patient not taking: Reported on 11/03/2015 08/09/14   Montez Morita, CNM  loperamide (IMODIUM) 2 MG capsule Take 1 capsule (2 mg total) by mouth 4 (four) times daily as needed for diarrhea or loose stools. Patient not taking: Reported on 11/03/2015 09/10/15   Linwood Dibbles, MD  naproxen (NAPROSYN) 500 MG tablet Take 1 tablet (500 mg total) by mouth 2 (two) times daily. 10/19/15   Cheri Fowler, PA-C    Family History Family History  Problem Relation Age of Onset  . Heart attack Mother   . Stomach  cancer Mother   . Hypertension Father     Social History Social History  Substance Use Topics  . Smoking status: Former Smoker    Packs/day: 0.50    Years: 1.00    Types: Cigarettes  . Smokeless tobacco: Never Used     Comment: quit 2 years ago  . Alcohol use 0.6 oz/week    1 Cans of beer per week     Comment: vodka 2-3 times weekly     Allergies   Patient has no known  allergies.   Review of Systems Review of Systems  Constitutional: Positive for fever. Negative for chills.  Eyes: Positive for visual disturbance.  Gastrointestinal: Positive for nausea. Negative for vomiting.  Musculoskeletal: Negative for neck pain and neck stiffness.  Neurological: Positive for headaches.  All other systems reviewed and are negative.    Physical Exam Updated Vital Signs BP 134/97   Pulse 91   Temp 98.3 F (36.8 C) (Oral)   Resp 18   Ht 5\' 6"  (1.676 m)   Wt 211 lb 11.2 oz (96 kg)   LMP 12/20/2015   SpO2 100%   BMI 34.17 kg/m   Physical Exam  Constitutional: She is oriented to person, place, and time. She appears well-developed and well-nourished.  HENT:  Head: Normocephalic and atraumatic.  Eyes: EOM are normal. Pupils are equal, round, and reactive to light.  Fundi are normal  Neck: Normal range of motion. Neck supple. No JVD present.  TTP of the right paracervical muscles   Cardiovascular: Normal rate, regular rhythm and normal heart sounds.   No murmur heard. Pulmonary/Chest: Effort normal and breath sounds normal. She has no wheezes. She has no rales. She exhibits no tenderness.  Abdominal: Soft. Bowel sounds are normal. She exhibits no distension and no mass. There is no tenderness.  Musculoskeletal: Normal range of motion. She exhibits no edema.  Lymphadenopathy:    She has no cervical adenopathy.  Neurological: She is alert and oriented to person, place, and time. No cranial nerve deficit. She exhibits normal muscle tone. Coordination normal.  Skin: Skin is warm and dry. No rash noted.  Psychiatric: She has a normal mood and affect. Her behavior is normal. Judgment and thought content normal.  Nursing note and vitals reviewed.    ED Treatments / Results  DIAGNOSTIC STUDIES: Oxygen Saturation is 100% on RA, normal by my interpretation.    COORDINATION OF CARE: 3:03 AM- Pt advised of plan for treatment and pt agrees. Pt will receive lab  work and CT head for further evaluation. She will also receive IV fluids, Toradol, Reglan, Benadryl, and Decadron.   Labs (all labs ordered are listed, but only abnormal results are displayed) Labs Reviewed  CBC - Abnormal; Notable for the following:       Result Value   Hemoglobin 11.7 (*)    HCT 35.6 (*)    All other components within normal limits  BASIC METABOLIC PANEL  I-STAT BETA HCG BLOOD, ED ([facility], [facility], [facility] ONLY)    Radiology Ct Head Wo Contrast  Result Date: 12/22/2015 CLINICAL DATA:  Headache, intermittent headaches for 2 weeks. Nausea, dizziness and foot numbness. EXAM: CT HEAD WITHOUT CONTRAST TECHNIQUE: Contiguous axial images were obtained from the base of the skull through the vertex without intravenous contrast. COMPARISON:  None. FINDINGS: Brain: No evidence of acute infarction, hemorrhage, hydrocephalus, extra-axial collection or mass lesion/mass effect. Vascular: No hyperdense vessel or unexpected calcification. Skull: Normal. Negative for fracture or focal lesion. Sinuses/Orbits: No  acute finding. Other: None. IMPRESSION: No acute intracranial abnormality. Electronically Signed   By: Rubye OaksMelanie  Ehinger M.D.   On: 12/22/2015 03:56    Procedures Procedures (including critical care time)  Medications Ordered in ED Medications  sodium chloride 0.9 % bolus 1,000 mL (1,000 mLs Intravenous New Bag/Given 12/22/15 0335)  ketorolac (TORADOL) 30 MG/ML injection 30 mg (30 mg Intravenous Given 12/22/15 0336)  metoCLOPramide (REGLAN) injection 10 mg (10 mg Intravenous Given 12/22/15 0336)  diphenhydrAMINE (BENADRYL) injection 25 mg (25 mg Intravenous Given 12/22/15 0336)  dexamethasone (DECADRON) injection 10 mg (10 mg Intravenous Given 12/22/15 [id number])     Initial Impression / Assessment and Plan / ED Course  Dione Boozeavid Solana Coggin, MD has reviewed the triage vital signs and the nursing notes.  Pertinent labs & imaging results that were available during my care of the patient were reviewed by me  and considered in my medical decision making (see chart for details).  Clinical Course    Headache with no characteristics suggestive of migraine. Possible muscle contraction headache. Because of the different pattern of her headache, she was sent for CT of the head which showed no acute process. Old records are reviewed, and she has no relevant past visits. She was given a migraine cocktail of normal saline, metoclopramide, diphenhydramine, ketorolac, and dexamethasone with excellent relief of headache. She is discharged with prescription for metoclopramide and told to use over-the-counter analgesics as needed.  Final Clinical Impressions(s) / ED Diagnoses   Final diagnoses:  Headache, unspecified headache type    New Prescriptions New Prescriptions   METOCLOPRAMIDE (REGLAN) 10 MG TABLET    Take 1 tablet (10 mg total) by mouth every 6 (six) hours as needed for nausea (or headache).   I personally performed the services described in this documentation, which was scribed in my presence. The recorded information has been reviewed and is accurate.       Dione Boozeavid Nazario Russom, MD 12/22/15 [phone]

## 2015-12-22 NOTE — ED Triage Notes (Signed)
Pt presents to ED for assessment of intermittent nausea, dizziness, and foot numbness.  Pt concerned about possible diabetes.  Pt wanting to be tested.  Pt also states intermittent migraines.  Denies abdominal pain, chest pain or shortness of breath.

## 2016-02-06 ENCOUNTER — Inpatient Hospital Stay (HOSPITAL_COMMUNITY)
Admission: AD | Admit: 2016-02-06 | Discharge: 2016-02-06 | Disposition: A | Discharge status: Home / Self Care | Payer: Medicaid Other | Source: Ambulatory Visit | Attending: Obstetrics & Gynecology | Admitting: Obstetrics & Gynecology

## 2016-02-06 ENCOUNTER — Encounter (HOSPITAL_COMMUNITY): Payer: Self-pay | Admitting: *Deleted

## 2016-02-06 DIAGNOSIS — N739 Female pelvic inflammatory disease, unspecified: Secondary | ICD-10-CM | POA: Insufficient documentation

## 2016-02-06 DIAGNOSIS — R109 Unspecified abdominal pain: Secondary | ICD-10-CM | POA: Insufficient documentation

## 2016-02-06 DIAGNOSIS — N730 Acute parametritis and pelvic cellulitis: Secondary | ICD-10-CM

## 2016-02-06 LAB — HEPATITIS B SURFACE ANTIGEN: Hepatitis B Surface Ag: NEGATIVE

## 2016-02-06 LAB — CBC WITH DIFFERENTIAL/PLATELET
BASOS ABS: 0 10*3/uL (ref 0.0–0.1)
BASOS PCT: 0 %
EOS ABS: 0.1 10*3/uL (ref 0.0–0.7)
Eosinophils Relative: 2 %
HCT: 33.8 % — ABNORMAL LOW (ref 36.0–46.0)
HEMOGLOBIN: 11.2 g/dL — AB (ref 12.0–15.0)
Lymphocytes Relative: 26 %
Lymphs Abs: 1.8 10*3/uL (ref 0.7–4.0)
MCH: 28.9 pg (ref 26.0–34.0)
MCHC: 33.1 g/dL (ref 30.0–36.0)
MCV: 87.1 fL (ref 78.0–100.0)
Monocytes Absolute: 0.3 10*3/uL (ref 0.1–1.0)
Monocytes Relative: 4 %
NEUTROS PCT: 68 %
Neutro Abs: 4.8 10*3/uL (ref 1.7–7.7)
Platelets: 286 10*3/uL (ref 150–400)
RBC: 3.88 MIL/uL (ref 3.87–5.11)
RDW: 13.1 % (ref 11.5–15.5)
WBC: 7 10*3/uL (ref 4.0–10.5)

## 2016-02-06 LAB — URINALYSIS, ROUTINE W REFLEX MICROSCOPIC
BILIRUBIN URINE: NEGATIVE
Glucose, UA: NEGATIVE mg/dL
Hgb urine dipstick: NEGATIVE
KETONES UR: NEGATIVE mg/dL
Leukocytes, UA: NEGATIVE
NITRITE: NEGATIVE
PH: 6 (ref 5.0–8.0)
Protein, ur: NEGATIVE mg/dL
SPECIFIC GRAVITY, URINE: 1.011 (ref 1.005–1.030)

## 2016-02-06 LAB — WET PREP, GENITAL
Clue Cells Wet Prep HPF POC: NONE SEEN
SPERM: NONE SEEN
Trich, Wet Prep: NONE SEEN
YEAST WET PREP: NONE SEEN

## 2016-02-06 LAB — POCT PREGNANCY, URINE: Preg Test, Ur: NEGATIVE

## 2016-02-06 MED ORDER — AZITHROMYCIN 250 MG PO TABS
1000.0000 mg | ORAL_TABLET | Freq: Once | ORAL | Status: AC
  Administered 2016-02-06: 1000 mg via ORAL
  Filled 2016-02-06: qty 4

## 2016-02-06 MED ORDER — CEFTRIAXONE SODIUM 250 MG IJ SOLR
250.0000 mg | Freq: Once | INTRAMUSCULAR | Status: AC
  Administered 2016-02-06: 250 mg via INTRAMUSCULAR
  Filled 2016-02-06: qty 250

## 2016-02-06 NOTE — MAU Note (Signed)
Pt states she is having cramping and liquid coming out.  Pt states the cramping started last night and the liquid started yesterday morning.  Pt states it is not discharge it is just liquid.

## 2016-02-06 NOTE — Discharge Instructions (Signed)
Pelvic Inflammatory Disease °Introduction °Pelvic inflammatory disease (PID) is an infection in some or all of the female organs. PID can be in the uterus, ovaries, fallopian tubes, or the surrounding tissues that are inside the lower belly area (pelvis). PID can lead to lasting problems if it is not treated. To check for this disease, your doctor may: °· Do a physical exam. °· Do blood tests, urine tests, or a pregnancy test. °· Look at your vaginal discharge. °· Do tests to look inside the pelvis. °· Test you for other infections. °Follow these instructions at home: °· Take over-the-counter and prescription medicines only as told by your doctor. °· If you were prescribed an antibiotic medicine, take it as told by your doctor. Do not stop taking it even if you start to feel better. °· Do not have sex until treatment is done or as told by your doctor. °· Tell your sex partner if you have PID. Your partner may need to be treated. °· Keep all follow-up visits as told by your doctor. This is important. °· Your doctor may test you for infection again 3 months after you are treated. °Contact a doctor if: °· You have more fluid (discharge) coming from your vagina or fluid that is not normal. °· Your pain does not improve. °· You throw up (vomit). °· You have a fever. °· You cannot take your medicines. °· Your partner has a sexually transmitted disease (STD). °· You have pain when you pee (urinate). °Get help right away if: °· You have more belly (abdominal) or lower belly pain. °· You have chills. °· You are not better after 72 hours. °This information is not intended to replace advice given to you by your health care provider. Make sure you discuss any questions you have with your health care provider. °Document Released: 03/31/2008 Document Revised: 06/10/2015 Document Reviewed: 02/09/2014 °© 2017 Elsevier ° °

## 2016-02-06 NOTE — MAU Provider Note (Signed)
Chief Complaint:  Abdominal Pain and Vaginal Discharge   First Provider Initiated Contact with Patient 02/06/16 1825       HPI: [name] [name] is a 26 y.o. G1P1001 who presents to maternity admissions reporting cramping and thin liquid coming from vagina.   Does worry about STDs due to partner's infidelity and wants testing for everything, including blood tests. She reports no vaginal bleeding, vaginal itching/burning, urinary symptoms, h/a, dizziness, n/v, or fever/chills.    Abdominal Pain  This is a new problem. The current episode started in the past 7 days. The onset quality is gradual. The problem occurs intermittently. The problem has been unchanged. The pain is located in the suprapubic region. The quality of the pain is cramping. The abdominal pain does not radiate. Pertinent negatives include no constipation, diarrhea, dysuria, fever, headaches, myalgias, nausea or vomiting. Nothing aggravates the pain. The pain is relieved by nothing.  Vaginal Discharge  The patient's primary symptoms include pelvic pain and vaginal discharge. The patient's pertinent negatives include no genital itching, genital lesions, genital odor or vaginal bleeding. This is a new problem. The current episode started in the past 7 days. The problem occurs constantly. The problem has been unchanged. The pain is mild. Associated symptoms include abdominal pain. Pertinent negatives include no back pain, constipation, diarrhea, dysuria, fever, headaches, nausea or vomiting. The vaginal discharge was milky and watery. There has been no bleeding. She has not been passing clots. She has not been passing tissue. Nothing aggravates the symptoms. She has tried nothing for the symptoms. She is sexually active. It is possible that her partner has an STD.    RN Note: Pt states she is having cramping and liquid coming out.  Pt states the cramping started last night and the liquid started yesterday morning.  Pt states it is not  discharge it is just liquid.  Past Medical History: Past Medical History:  Diagnosis Date  . Bipolar 1 disorder (HCC)   . Depression   . Gallstones and inflammation of gallbladder with obstruction 10/19/2015  . Suicidal overdose (HCC)     Past obstetric history: OB History  Gravida Para Term Preterm AB Living  1 1 1  0 0 1  SAB TAB Ectopic Multiple Live Births  0 0 0 0 1    # Outcome Date GA Lbr Len/2nd Weight Sex Delivery Anes PTL Lv  1 Term 08/07/14 [id number] 02:11 / 00:08 6 lb 5.8 oz (2.885 kg) F Vag-Spont EPI  LIV     Birth Comments: NONE      Past Surgical History: Past Surgical History:  Procedure Laterality Date  . CHOLECYSTECTOMY N/A 11/04/2015   Procedure: LAPAROSCOPIC CHOLECYSTECTOMY WITH INTRAOPERATIVE CHOLANGIOGRAM;  Surgeon: Ovidio Kinavid Newman, MD;  [facility];  Service: General;  Laterality: N/A;    Family History: Family History  Problem Relation Age of Onset  . Heart attack Mother   . Stomach cancer Mother   . Hypertension Father     Social History: Social History  Substance Use Topics  . Smoking status: Former Smoker    Packs/day: 0.50    Years: 1.00    Types: Cigarettes  . Smokeless tobacco: Never Used     Comment: quit 2 years ago  . Alcohol use 0.6 oz/week    1 Cans of beer per week     Comment: vodka 2-3 times weekly    Allergies: No Known Allergies  Meds:  Prescriptions Prior to Admission  Medication Sig Dispense Refill Last Dose  . HYDROcodone-acetaminophen (  NORCO/VICODIN) 5-325 MG tablet Take 1 tablet by mouth every 4 (four) hours as needed for moderate pain. 30 tablet 0   . ibuprofen (ADVIL,MOTRIN) 600 MG tablet Take 1 tablet (600 mg total) by mouth every 6 (six) hours. (Patient not taking: Reported on 11/03/2015) 30 tablet 0 Not Taking at Unknown time  . loperamide (IMODIUM) 2 MG capsule Take 1 capsule (2 mg total) by mouth 4 (four) times daily as needed for diarrhea or loose stools. (Patient not taking: Reported on 11/03/2015) 12  capsule 0 Not Taking at Unknown time  . metoCLOPramide (REGLAN) 10 MG tablet Take 1 tablet (10 mg total) by mouth every 6 (six) hours as needed for nausea (or headache). 30 tablet 0   . naproxen (NAPROSYN) 500 MG tablet Take 1 tablet (500 mg total) by mouth 2 (two) times daily. 30 tablet 0 11/03/2015 at Unknown time    I have reviewed patient's Past Medical Hx, Surgical Hx, Family Hx, Social Hx, medications and allergies.  ROS:  Review of Systems  Constitutional: Negative for fever.  Gastrointestinal: Positive for abdominal pain. Negative for constipation, diarrhea, nausea and vomiting.  Genitourinary: Positive for pelvic pain and vaginal discharge. Negative for dysuria.  Musculoskeletal: Negative for back pain and myalgias.  Neurological: Negative for headaches.   Other systems negative     Physical Exam  Patient Vitals for the past 24 hrs:  BP Temp Temp src Pulse Resp SpO2 Height Weight  02/06/16 1753 124/83 98.1 F (36.7 C) Oral 84 16 100 % - -  02/06/16 1746 - - - - - - 5\' 5"  (1.651 m) 214 lb 12.8 oz (97.4 kg)   Constitutional: Well-developed, well-nourished female in no acute distress.  Cardiovascular: normal rate and rhythm Respiratory: normal effort, no distress.  GI: Abd soft, non-tender.  Nondistended.  No rebound, No guarding.   MS: Extremities nontender, no edema, normal ROM Neurologic: Alert and oriented x 4.   Grossly nonfocal. GU: Neg CVAT. Skin:  Warm and Dry Psych:  Affect appropriate.  PELVIC EXAM: Cervix pink, visually closed, without lesion, scant white milky discharge, vaginal walls and external genitalia normal Bimanual exam: Cervix firm, anterior, neg CMT, uterus quite tender, nonenlarged, adnexa with bilateral tenderness, no enlargement or mass    Labs:    Results for orders placed or performed during the hospital encounter of 02/06/16 (from the past 24 hour(s))  Urinalysis, Routine w reflex microscopic     Status: Abnormal   Collection Time:  02/06/16  5:45 PM  Result Value Ref Range   Color, Urine YELLOW YELLOW   APPearance HAZY (A) CLEAR   Specific Gravity, Urine 1.011 1.005 - 1.030   pH 6.0 5.0 - 8.0   Glucose, UA NEGATIVE NEGATIVE mg/dL   Hgb urine dipstick NEGATIVE NEGATIVE   Bilirubin Urine NEGATIVE NEGATIVE   Ketones, ur NEGATIVE NEGATIVE mg/dL   Protein, ur NEGATIVE NEGATIVE mg/dL   Nitrite NEGATIVE NEGATIVE   Leukocytes, UA NEGATIVE NEGATIVE  Pregnancy, urine POC     Status: None   Collection Time: 02/06/16  5:49 PM  Result Value Ref Range   Preg Test, Ur NEGATIVE NEGATIVE  Wet prep, genital     Status: Abnormal   Collection Time: 02/06/16  6:39 PM  Result Value Ref Range   Yeast Wet Prep HPF POC NONE SEEN NONE SEEN   Trich, Wet Prep NONE SEEN NONE SEEN   Clue Cells Wet Prep HPF POC NONE SEEN NONE SEEN   WBC, Wet Prep HPF POC FEW (  A) NONE SEEN   Sperm NONE SEEN   CBC with Differential/Platelet     Status: Abnormal   Collection Time: 02/06/16  6:41 PM  Result Value Ref Range   WBC 7.0 4.0 - 10.5 K/uL   RBC 3.88 3.87 - 5.11 MIL/uL   Hemoglobin 11.2 (L) 12.0 - 15.0 g/dL   HCT 16.1 (L) 09.6 - 04.5 %   MCV 87.1 78.0 - 100.0 fL   MCH 28.9 26.0 - 34.0 pg   MCHC 33.1 30.0 - 36.0 g/dL   RDW 40.9 81.1 - 91.4 %   Platelets 286 150 - 400 K/uL   Neutrophils Relative % 68 %   Neutro Abs 4.8 1.7 - 7.7 K/uL   Lymphocytes Relative 26 %   Lymphs Abs 1.8 0.7 - 4.0 K/uL   Monocytes Relative 4 %   Monocytes Absolute 0.3 0.1 - 1.0 K/uL   Eosinophils Relative 2 %   Eosinophils Absolute 0.1 0.0 - 0.7 K/uL   Basophils Relative 0 %   Basophils Absolute 0.0 0.0 - 0.1 K/uL     Imaging:  No results found.  MAU Course/MDM: I have ordered labs as follows: Cultures, wet prep, uA and CBC to rule out abdominal infection Imaging ordered: none Results reviewed. Wet prep and WBC normal   Due to degree of tenderness on exam, will treat presumptively for PID. Patient agreeable   Treatments in MAU included Zithromax and  Rocephin.   Pt stable at time of discharge.  Assessment: Pelvic Inflammatory Infection/Disease  Plan: Discharge home Recommend Safe sex, abstinence until treated (both) Has ibuprofen at home, will use for pain   Encouraged to return here or to other Urgent Care/ED if she develops worsening of symptoms, increase in pain, fever, or other concerning symptoms.   Wynelle Bourgeois CNM, MSN Certified Nurse-Midwife 02/06/2016 6:47 PM

## 2016-02-07 LAB — RPR: RPR Ser Ql: NONREACTIVE

## 2016-02-07 LAB — GC/CHLAMYDIA PROBE AMP (~~LOC~~) NOT AT ARMC
Chlamydia: NEGATIVE
Neisseria Gonorrhea: NEGATIVE

## 2016-02-08 ENCOUNTER — Ambulatory Visit: Payer: Self-pay

## 2016-02-08 LAB — HIV ANTIBODY (ROUTINE TESTING W REFLEX): HIV SCREEN 4TH GENERATION: NONREACTIVE

## 2016-03-03 ENCOUNTER — Encounter (HOSPITAL_COMMUNITY): Payer: Self-pay | Admitting: Nurse Practitioner

## 2016-03-03 ENCOUNTER — Emergency Department (HOSPITAL_COMMUNITY)
Admission: EM | Admit: 2016-03-03 | Discharge: 2016-03-03 | Disposition: A | Discharge status: Home / Self Care | Payer: Self-pay | Attending: Emergency Medicine | Admitting: Emergency Medicine

## 2016-03-03 DIAGNOSIS — R69 Illness, unspecified: Secondary | ICD-10-CM

## 2016-03-03 DIAGNOSIS — Z87891 Personal history of nicotine dependence: Secondary | ICD-10-CM | POA: Insufficient documentation

## 2016-03-03 DIAGNOSIS — J111 Influenza due to unidentified influenza virus with other respiratory manifestations: Secondary | ICD-10-CM | POA: Insufficient documentation

## 2016-03-03 LAB — COMPREHENSIVE METABOLIC PANEL
ALK PHOS: 56 U/L (ref 38–126)
ALT: 14 U/L (ref 14–54)
ANION GAP: 8 (ref 5–15)
AST: 18 U/L (ref 15–41)
Albumin: 4.2 g/dL (ref 3.5–5.0)
BILIRUBIN TOTAL: 0.9 mg/dL (ref 0.3–1.2)
BUN: 9 mg/dL (ref 6–20)
CALCIUM: 9.2 mg/dL (ref 8.9–10.3)
CO2: 26 mmol/L (ref 22–32)
Chloride: 104 mmol/L (ref 101–111)
Creatinine, Ser: 0.76 mg/dL (ref 0.44–1.00)
GFR calc Af Amer: >60 mL/min (ref >60)
GFR calc non Af Amer: >60 mL/min (ref >60)
Glucose, Bld: 96 mg/dL (ref 65–99)
POTASSIUM: 3.8 mmol/L (ref 3.5–5.1)
Sodium: 138 mmol/L (ref 135–145)
TOTAL PROTEIN: 7.6 g/dL (ref 6.5–8.1)

## 2016-03-03 LAB — CBC
HEMATOCRIT: 40.6 % (ref 36.0–46.0)
HEMOGLOBIN: 13.1 g/dL (ref 12.0–15.0)
MCH: 28.7 pg (ref 26.0–34.0)
MCHC: 32.3 g/dL (ref 30.0–36.0)
MCV: 88.8 fL (ref 78.0–100.0)
Platelets: 289 10*3/uL (ref 150–400)
RBC: 4.57 MIL/uL (ref 3.87–5.11)
RDW: 13 % (ref 11.5–15.5)
WBC: 5.9 10*3/uL (ref 4.0–10.5)

## 2016-03-03 LAB — I-STAT BETA HCG BLOOD, ED (MC, WL, AP ONLY)

## 2016-03-03 MED ORDER — ONDANSETRON 4 MG PO TBDP
8.0000 mg | ORAL_TABLET | Freq: Once | ORAL | Status: AC
  Administered 2016-03-03: 8 mg via ORAL
  Filled 2016-03-03: qty 2

## 2016-03-03 MED ORDER — ACETAMINOPHEN 325 MG PO TABS
650.0000 mg | ORAL_TABLET | Freq: Once | ORAL | Status: AC
  Administered 2016-03-03: 650 mg via ORAL
  Filled 2016-03-03: qty 2

## 2016-03-03 MED ORDER — LOPERAMIDE HCL 2 MG PO CAPS
2.0000 mg | ORAL_CAPSULE | ORAL | Status: DC | PRN
  Administered 2016-03-03: 2 mg via ORAL
  Filled 2016-03-03: qty 1

## 2016-03-03 MED ORDER — DIPHENOXYLATE-ATROPINE 2.5-0.025 MG PO TABS: 2.0000 | ORAL_TABLET | Freq: Four times a day (QID) | ORAL | 0 refills | Status: DC | PRN

## 2016-03-03 MED ORDER — OSELTAMIVIR PHOSPHATE 75 MG PO CAPS: 75.0000 mg | ORAL_CAPSULE | Freq: Two times a day (BID) | ORAL | 0 refills | Status: DC

## 2016-03-03 MED ORDER — ONDANSETRON 8 MG PO TBDP: 8.0000 mg | ORAL_TABLET | Freq: Three times a day (TID) | ORAL | 0 refills | Status: DC | PRN

## 2016-03-03 NOTE — ED Notes (Signed)
Pt departed in NAD, refused use of wheelchair.  

## 2016-03-03 NOTE — ED Notes (Signed)
ED Provider at bedside. 

## 2016-03-03 NOTE — ED Provider Notes (Signed)
[facility] Provider Note   CSN: [id number] Arrival date & time: 03/03/16  1733     History   Chief Complaint Chief Complaint  Patient presents with  . Influenza    HPI [name] [name] is a 26 y.o. female.  26 year old female presents with flulike symptoms 12 hours. Describes no active cough with myalgias as well as positive sick exposures. Subjective fever as well 2. She has had emesis which is been nonbilious or bloody as well as slight watery diarrhea. States that her daughter has been sick. Feels weak all over but denies any dyspnea. No headache or photophobia. No neck pain. No rashes noted.      Past Medical History:  Diagnosis Date  . Bipolar 1 disorder (HCC)   . Depression   . Gallstones and inflammation of gallbladder with obstruction 10/19/2015  . Suicidal overdose [facility])     Patient Active Problem List   Diagnosis Date Noted  . Cholelithiasis 11/04/2015  . Environmental allergies 05/28/2014  . Depression 04/29/2012    Past Surgical History:  Procedure Laterality Date  . CHOLECYSTECTOMY N/A 11/04/2015   Procedure: LAPAROSCOPIC CHOLECYSTECTOMY WITH INTRAOPERATIVE CHOLANGIOGRAM;  Surgeon: Ovidio Kin, MD;  Location: [facility] OR;  Service: General;  Laterality: N/A;    OB History    Gravida Para Term Preterm AB Living   1 1 1  0 0 1   SAB TAB Ectopic Multiple Live Births   0 0 0 0 1       Home Medications    Prior to Admission medications   Medication Sig Start Date End Date Taking? Authorizing Provider  HYDROcodone-acetaminophen (NORCO/VICODIN) 5-325 MG tablet Take 1 tablet by mouth every 4 (four) hours as needed for moderate pain. 11/05/15   Edson Snowball, PA-C  ibuprofen (ADVIL,MOTRIN) 600 MG tablet Take 1 tablet (600 mg total) by mouth every 6 (six) hours. Patient not taking: Reported on 11/03/2015 08/09/14   Montez Morita, CNM  loperamide (IMODIUM) 2 MG capsule Take 1 capsule (2 mg total) by mouth 4 (four) times daily as needed for diarrhea  or loose stools. Patient not taking: Reported on 11/03/2015 09/10/15   Linwood Dibbles, MD  metoCLOPramide (REGLAN) 10 MG tablet Take 1 tablet (10 mg total) by mouth every 6 (six) hours as needed for nausea (or headache). 12/22/15   Dione Booze, MD  naproxen (NAPROSYN) 500 MG tablet Take 1 tablet (500 mg total) by mouth 2 (two) times daily. 10/19/15   Cheri Fowler, PA-C    Family History Family History  Problem Relation Age of Onset  . Heart attack Mother   . Stomach cancer Mother   . Hypertension Father     Social History Social History  Substance Use Topics  . Smoking status: Former Smoker    Packs/day: 0.50    Years: 1.00    Types: Cigarettes  . Smokeless tobacco: Never Used     Comment: quit 2 years ago  . Alcohol use 0.6 oz/week    1 Cans of beer per week     Comment: vodka 2-3 times weekly     Allergies   Patient has no known allergies.   Review of Systems Review of Systems  All other systems reviewed and are negative.    Physical Exam Updated Vital Signs BP 121/75   Pulse 89   Temp 98.9 F (37.2 C) (Oral)   Resp 16   LMP 02/11/2016   SpO2 98%   Physical Exam  Constitutional: She is oriented to person, place,  and time. She appears well-developed and well-nourished.  Non-toxic appearance. No distress.  HENT:  Head: Normocephalic and atraumatic.  Eyes: Conjunctivae, EOM and lids are normal. Pupils are equal, round, and reactive to light.  Neck: Normal range of motion. Neck supple. No tracheal deviation present. No thyroid mass present.  Cardiovascular: Normal rate, regular rhythm and normal heart sounds.  Exam reveals no gallop.   No murmur heard. Pulmonary/Chest: Effort normal and breath sounds normal. No stridor. No respiratory distress. She has no decreased breath sounds. She has no wheezes. She has no rhonchi. She has no rales.  Abdominal: Soft. Normal appearance and bowel sounds are normal. She exhibits no distension. There is no tenderness. There is no rebound  and no CVA tenderness.  Musculoskeletal: Normal range of motion. She exhibits no edema or tenderness.  Neurological: She is alert and oriented to person, place, and time. She has normal strength. No cranial nerve deficit or sensory deficit. GCS eye subscore is 4. GCS verbal subscore is 5. GCS motor subscore is 6.  Skin: Skin is warm and dry. No abrasion and no rash noted.  Psychiatric: She has a normal mood and affect. Her speech is normal and behavior is normal.  Nursing note and vitals reviewed.    ED Treatments / Results  Labs (all labs ordered are listed, but only abnormal results are displayed) Labs Reviewed  COMPREHENSIVE METABOLIC PANEL  CBC  URINALYSIS, ROUTINE W REFLEX MICROSCOPIC  I-STAT BETA HCG BLOOD, ED ([facility], [facility], [facility] ONLY)    EKG  EKG Interpretation None       Radiology No results found.  Procedures Procedures (including critical care time)  Medications Ordered in ED Medications  ondansetron (ZOFRAN-ODT) disintegrating tablet 8 mg (not administered)  loperamide (IMODIUM) capsule 2 mg (not administered)  acetaminophen (TYLENOL) tablet 650 mg (not administered)     Initial Impression / Assessment and Plan / ED Course  I have reviewed the triage vital signs and the nursing notes.  Pertinent labs & imaging results that were available during my care of the patient were reviewed by me and considered in my medical decision making (see chart for details).     Patient given Lomotil, Zofran, Tylenol here. Suspect influenza. Will prescribe Tamiflu as well as supportive care.  Final Clinical Impressions(s) / ED Diagnoses   Final diagnoses:  None    New Prescriptions New Prescriptions   No medications on file     Lorre NickAnthony Eliora Nienhuis, MD 03/03/16 2246

## 2016-03-03 NOTE — ED Triage Notes (Signed)
Pt presents with c/o flu like symptoms. The symptoms began this morning. She c/o chills, headaches, cough, nausea, voimting, diarrhea. She has not been able to eat or sleep due to the symptoms

## 2016-05-21 ENCOUNTER — Emergency Department (HOSPITAL_COMMUNITY)
Admission: EM | Admit: 2016-05-21 | Discharge: 2016-05-21 | Disposition: A | Discharge status: Home / Self Care | Payer: Self-pay | Attending: Emergency Medicine | Admitting: Emergency Medicine

## 2016-05-21 DIAGNOSIS — Y939 Activity, unspecified: Secondary | ICD-10-CM | POA: Insufficient documentation

## 2016-05-21 DIAGNOSIS — Z79899 Other long term (current) drug therapy: Secondary | ICD-10-CM | POA: Insufficient documentation

## 2016-05-21 DIAGNOSIS — Y9289 Other specified places as the place of occurrence of the external cause: Secondary | ICD-10-CM | POA: Insufficient documentation

## 2016-05-21 DIAGNOSIS — S46912A Strain of unspecified muscle, fascia and tendon at shoulder and upper arm level, left arm, initial encounter: Secondary | ICD-10-CM | POA: Insufficient documentation

## 2016-05-21 DIAGNOSIS — X58XXXA Exposure to other specified factors, initial encounter: Secondary | ICD-10-CM | POA: Insufficient documentation

## 2016-05-21 DIAGNOSIS — Y990 Civilian activity done for income or pay: Secondary | ICD-10-CM | POA: Insufficient documentation

## 2016-05-21 DIAGNOSIS — M542 Cervicalgia: Secondary | ICD-10-CM | POA: Insufficient documentation

## 2016-05-21 DIAGNOSIS — Z87891 Personal history of nicotine dependence: Secondary | ICD-10-CM | POA: Insufficient documentation

## 2016-05-21 NOTE — ED Provider Notes (Signed)
[facility] Provider Note   CSN: [id number] Arrival date & time: 05/21/16  1056   By signing my name below, I, [name], attest that this documentation has been prepared under the direction and in the presence of Mancel Bale, MD. Electronically Signed: [name], ED Scribe. 05/21/16. 12:54 PM.  History   Chief Complaint Chief Complaint  Patient presents with  . Arm Pain  . Nasal Congestion    HPI [name] [name] is a 26 y.o. female who presents to the Emergency Department complaining of left arm pain onset yesterday. Pt reports associated left sided neck pain and lower back pain x yesterday. Pt has not tried any medications for the relief of her symptoms. 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 Pt left arm pain is worsened with movement and alleviated with rest. Denies having similar symptoms in the past. She denies swelling, color change, wound, recent fall, and any other symptoms.     The history is provided by the patient. No language interpreter was used.    Past Medical History:  Diagnosis Date  . Bipolar 1 disorder (HCC)   . Depression   . Gallstones and inflammation of gallbladder with obstruction 10/19/2015  . Suicidal overdose [facility])     Patient Active Problem List   Diagnosis Date Noted  . Cholelithiasis 11/04/2015  . Environmental allergies 05/28/2014  . Depression 04/29/2012    Past Surgical History:  Procedure Laterality Date  . CHOLECYSTECTOMY N/A 11/04/2015   Procedure: LAPAROSCOPIC CHOLECYSTECTOMY WITH INTRAOPERATIVE CHOLANGIOGRAM;  Surgeon: Ovidio Kin, MD;  Location: [facility] OR;  Service: General;  Laterality: N/A;    OB History    Gravida Para Term Preterm AB Living   1 1 1  0 0 1   SAB TAB Ectopic Multiple Live Births   0 0 0 0 1       Home Medications    Prior to Admission medications   Medication Sig Start Date End Date Taking? Authorizing Provider    diphenoxylate-atropine (LOMOTIL) 2.5-0.025 MG tablet Take 2 tablets by mouth 4 (four) times daily as needed for diarrhea or loose stools. 03/03/16   Lorre Nick, MD  HYDROcodone-acetaminophen (NORCO/VICODIN) 5-325 MG tablet Take 1 tablet by mouth every 4 (four) hours as needed for moderate pain. 11/05/15   Evangeline Gula A, PA-C  ibuprofen (ADVIL,MOTRIN) 600 MG tablet Take 1 tablet (600 mg total) by mouth every 6 (six) hours. Patient not taking: Reported on 11/03/2015 08/09/14   Montez Morita, CNM  loperamide (IMODIUM) 2 MG capsule Take 1 capsule (2 mg total) by mouth 4 (four) times daily as needed for diarrhea or loose stools. Patient not taking: Reported on 11/03/2015 09/10/15   Linwood Dibbles, MD  metoCLOPramide (REGLAN) 10 MG tablet Take 1 tablet (10 mg total) by mouth every 6 (six) hours as needed for nausea (or headache). 12/22/15   Dione Booze, MD  naproxen (NAPROSYN) 500 MG tablet Take 1 tablet (500 mg total) by mouth 2 (two) times daily. 10/19/15   Cheri Fowler, PA-C  ondansetron (ZOFRAN ODT) 8 MG disintegrating tablet Take 1 tablet (8 mg total) by mouth every 8 (eight) hours as needed for nausea or vomiting. 03/03/16   Lorre Nick, MD  oseltamivir (TAMIFLU) 75 MG capsule Take 1 capsule (75 mg total) by mouth every 12 (twelve) hours. 03/03/16   Lorre Nick, MD    Family History Family History  Problem Relation Age of Onset  .  Heart attack Mother   . Stomach cancer Mother   . Hypertension Father     Social History Social History  Substance Use Topics  . Smoking status: Former Smoker    Packs/day: 0.50    Years: 1.00    Types: Cigarettes  . Smokeless tobacco: Never Used     Comment: quit 2 years ago  . Alcohol use 0.6 oz/week    1 Cans of beer per week     Comment: vodka 2-3 times weekly     Allergies   Patient has no known allergies.   Review of Systems Review of Systems  Musculoskeletal: Positive for arthralgias (left arm), back pain (lower) and neck pain (left  sided). Negative for joint swelling.  Skin: Negative for color change and wound.  All other systems reviewed and are negative.    Physical Exam Updated Vital Signs BP (!) 130/91   Pulse 92   Temp 99 F (37.2 C)   Resp 18   SpO2 99%   Physical Exam  Constitutional: She is oriented to person, place, and time. She appears well-developed and well-nourished.  HENT:  Head: Normocephalic and atraumatic.  Eyes: EOM are normal.  Neck: Normal range of motion and phonation normal. Neck supple. Muscular tenderness present. No spinous process tenderness present.  Mild left lateral neck tenderness. No cervical spinal tenderness.    Cardiovascular: Normal rate, regular rhythm and normal heart sounds.  Exam reveals no gallop and no friction rub.   No murmur heard. Pulmonary/Chest: Effort normal and breath sounds normal. No respiratory distress. She has no wheezes. She has no rales. She exhibits no tenderness.  Musculoskeletal: Normal range of motion.  Neurological: She is alert and oriented to person, place, and time. She exhibits normal muscle tone.  Skin: Skin is warm and dry.  Psychiatric: She has a normal mood and affect. Her behavior is normal. Judgment and thought content normal.  Nursing note and vitals reviewed.    ED Treatments / Results  DIAGNOSTIC STUDIES: Oxygen Saturation is 99% on RA, nl by my interpretation.    COORDINATION OF CARE: 12:53 PM Discussed treatment plan with pt at bedside which includes sling, ibuprofen, ice, and pt agreed to plan.   Procedures Procedures (including critical care time)  Medications Ordered in ED Medications - No data to display   Initial Impression / Assessment and Plan / ED Course  I have reviewed the triage vital signs and the nursing notes.   Medications - No data to display  Patient Vitals for the past 24 hrs:  BP Temp Pulse Resp SpO2  05/21/16 1105 (!) 130/91 - 92 18 99 %  05/21/16 1104 - 99 F (37.2 C) - - -    1:00 PM  Reevaluation with update and discussion. After initial assessment and treatment, an updated evaluation reveals no change in clinical status.  Findings discussed with the patient and all questions were answered. Jaki Steptoe L    Final Clinical Impressions(s) / ED Diagnoses   Final diagnoses:  Shoulder strain, left, initial encounter  Neck pain   Nonspecific pain left neck and left shoulder, likely muscular in origin.  Doubt cervical radiculopathy, or significant tendinitis.  There is no indication for imaging.  Nursing Notes Reviewed/ Care Coordinated Applicable Imaging Reviewed Interpretation of Laboratory Data incorporated into ED treatment  The patient appears reasonably screened and/or stabilized for discharge and I doubt any other medical condition or other [facility] requiring further screening, evaluation, or treatment in the ED at this time  prior to discharge.  Plan: Home Medications-ibuprofen as needed; Home Treatments-cryotherapy advance to heat, sling as needed; return here if the recommended treatment, does not improve the symptoms; Recommended follow up-PCP as needed    New Prescriptions New Prescriptions   No medications on file   I personally performed the services described in this documentation, which was scribed in my presence. The recorded information has been reviewed and is accurate.     Mancel Bale, MD 05/21/16 1302

## 2016-05-21 NOTE — ED Triage Notes (Signed)
Pt reports yesterday her left arm had sharp shooting pain down arm. Today no shooting pain but she reports she can barely lift her left arm. Denies injury. Pt points to left shoulder when pointing to the pain.

## 2016-05-21 NOTE — Discharge Instructions (Signed)
Wear the sling as needed for comfort. Use ice on the sore areas of your left shoulder and left neck, 3 or 4 times a day for 3 days.  After that use heat and start gently moving the neck and shoulder.  For pain, use ibuprofen 400 mg 3 times a day with meals.  See the doctor of your choice if not better in 3 or 4 days.

## 2016-07-03 ENCOUNTER — Ambulatory Visit (HOSPITAL_COMMUNITY)
Admission: EM | Admit: 2016-07-03 | Discharge: 2016-07-03 | Disposition: A | Discharge status: Home / Self Care | Payer: Self-pay | Attending: Family Medicine | Admitting: Family Medicine

## 2016-07-03 ENCOUNTER — Encounter (HOSPITAL_COMMUNITY): Payer: Self-pay | Admitting: Emergency Medicine

## 2016-07-03 DIAGNOSIS — B351 Tinea unguium: Secondary | ICD-10-CM

## 2016-07-03 MED ORDER — TERBINAFINE HCL 250 MG PO TABS: 250.0000 mg | ORAL_TABLET | Freq: Every day | ORAL | 2 refills | Status: DC

## 2016-07-03 NOTE — ED Triage Notes (Signed)
Pt c/o bilateral toe numbness and discoloration onset 4 days  Voices no other concerns.... A&O x4... NAD.

## 2016-07-03 NOTE — ED Provider Notes (Signed)
[facility]    CSN: [id number] Arrival date & time: 07/03/16  1618     History   Chief Complaint Chief Complaint  Patient presents with  . Nail Problem    HPI [name] [name] is a 26 y.o. female.   This 26 year old woman has noticed some numbness on the right side for right great toe for the last 4 days. She was worried that she might be developing diabetes. She's had no trauma to the area and she has no pain.      Past Medical History:  Diagnosis Date  . Bipolar 1 disorder (HCC)   . Depression   . Gallstones and inflammation of gallbladder with obstruction 10/19/2015  . Suicidal overdose [facility])     Patient Active Problem List   Diagnosis Date Noted  . Cholelithiasis 11/04/2015  . Environmental allergies 05/28/2014  . Depression 04/29/2012    Past Surgical History:  Procedure Laterality Date  . CHOLECYSTECTOMY N/A 11/04/2015   Procedure: LAPAROSCOPIC CHOLECYSTECTOMY WITH INTRAOPERATIVE CHOLANGIOGRAM;  Surgeon: Ovidio Kin, MD;  Location: [facility] OR;  Service: General;  Laterality: N/A;    OB History    Gravida Para Term Preterm AB Living   1 1 1  0 0 1   SAB TAB Ectopic Multiple Live Births   0 0 0 0 1       Home Medications    Prior to Admission medications   Medication Sig Start Date End Date Taking? Authorizing Provider  terbinafine (LAMISIL) 250 MG tablet Take 1 tablet (250 mg total) by mouth daily. 07/03/16   Elvina Sidle, MD    Family History Family History  Problem Relation Age of Onset  . Heart attack Mother   . Stomach cancer Mother   . Hypertension Father     Social History Social History  Substance Use Topics  . Smoking status: Former Smoker    Packs/day: 0.50    Years: 1.00    Types: Cigarettes  . Smokeless tobacco: Never Used     Comment: quit 2 years ago  . Alcohol use 0.6 oz/week    1 Cans of beer per week     Comment: vodka 2-3 times weekly     Allergies   Patient has no known allergies.   Review of  Systems Review of Systems  Neurological: Positive for numbness.  All other systems reviewed and are negative.    Physical Exam Triage Vital Signs ED Triage Vitals [07/03/16 1641]  Enc Vitals Group     BP 124/70     Pulse Rate 97     Resp 16     Temp 98.9 F (37.2 C)     Temp Source Oral     SpO2 100 %     Weight      Height      Head Circumference      Peak Flow      Pain Score      Pain Loc      Pain Edu?      Excl. in GC?    No data found.   Updated Vital Signs BP 124/70 (BP Location: Right Arm)   Pulse 97   Temp 98.9 F (37.2 C) (Oral)   Resp 16   LMP 06/26/2016   SpO2 100%   Breastfeeding? No    Physical Exam  Constitutional: She is oriented to person, place, and time. She appears well-developed and well-nourished.  HENT:  Right Ear: External ear normal.  Left Ear: External ear normal.  Mouth/Throat: Oropharynx is clear and moist.  Eyes: Conjunctivae are normal. Pupils are equal, round, and reactive to light.  Neck: Normal range of motion. Neck supple.  Pulmonary/Chest: Effort normal.  Musculoskeletal: Normal range of motion.  Neurological: She is alert and oriented to person, place, and time.  Skin: Skin is warm and dry.  Onychomycotic nail right great toe with thickening  Nursing note and vitals reviewed.    UC Treatments / Results  Labs (all labs ordered are listed, but only abnormal results are displayed) Labs Reviewed - No data to display  EKG  EKG Interpretation None       Radiology No results found.  Procedures Procedures (including critical care time)  Medications Ordered in UC Medications - No data to display   Initial Impression / Assessment and Plan / UC Course  I have reviewed the triage vital signs and the nursing notes.  Pertinent labs & imaging results that were available during my care of the patient were reviewed by me and considered in my medical decision making (see chart for details).     Final Clinical  Impressions(s) / UC Diagnoses   Final diagnoses:  Onychomycosis    New Prescriptions New Prescriptions   TERBINAFINE (LAMISIL) 250 MG TABLET    Take 1 tablet (250 mg total) by mouth daily.     Elvina SidleLauenstein, Marisal Swarey, MD 07/03/16 1651

## 2016-07-13 ENCOUNTER — Ambulatory Visit (HOSPITAL_COMMUNITY)
Admission: EM | Admit: 2016-07-13 | Discharge: 2016-07-13 | Disposition: A | Discharge status: Home / Self Care | Payer: Self-pay | Attending: Internal Medicine | Admitting: Internal Medicine

## 2016-07-13 ENCOUNTER — Encounter (HOSPITAL_COMMUNITY): Payer: Self-pay | Admitting: Emergency Medicine

## 2016-07-13 DIAGNOSIS — H1012 Acute atopic conjunctivitis, left eye: Secondary | ICD-10-CM

## 2016-07-13 MED ORDER — OLOPATADINE HCL 0.2 % OP SOLN: 1.0000 [drp] | OPHTHALMIC | 0 refills | Status: AC

## 2016-07-13 NOTE — ED Triage Notes (Signed)
Left eye red for a week.  Yesterday eye was tearing .  Job requesting a note for her to return to work

## 2016-07-13 NOTE — ED Provider Notes (Signed)
CSN: [id number]     Arrival date & time 07/13/16  1351 History   None    Chief Complaint  Patient presents with  . Eye Pain   (Consider location/radiation/quality/duration/timing/severity/associated sxs/prior Treatment) Patient has had some left eye redness for a week and she has come to clinic at behest of her work.   The history is provided by the patient.  Eye Pain  This is a new problem. The problem occurs constantly.    Past Medical History:  Diagnosis Date  . Bipolar 1 disorder (HCC)   . Depression   . Gallstones and inflammation of gallbladder with obstruction 10/19/2015  . Suicidal overdose [facility](HCC)    Past Surgical History:  Procedure Laterality Date  . CHOLECYSTECTOMY N/A 11/04/2015   Procedure: LAPAROSCOPIC CHOLECYSTECTOMY WITH INTRAOPERATIVE CHOLANGIOGRAM;  Surgeon: Ovidio Kinavid Newman, MD;  [facility];  Service: General;  Laterality: N/A;   Family History  Problem Relation Age of Onset  . Heart attack Mother   . Stomach cancer Mother   . Hypertension Father    Social History  Substance Use Topics  . Smoking status: Former Smoker    Packs/day: 0.50    Years: 1.00    Types: Cigarettes  . Smokeless tobacco: Never Used     Comment: quit 2 years ago  . Alcohol use 0.6 oz/week    1 Cans of beer per week     Comment: vodka 2-3 times weekly   OB History    Gravida Para Term Preterm AB Living   1 1 1  0 0 1   SAB TAB Ectopic Multiple Live Births   0 0 0 0 1     Review of Systems  Constitutional: Negative.   HENT: Negative.   Eyes: Positive for redness. Negative for pain and itching.  Respiratory: Negative.   Cardiovascular: Negative.   Gastrointestinal: Negative.   Endocrine: Negative.   Genitourinary: Negative.   Musculoskeletal: Negative.   Allergic/Immunologic: Negative.   Neurological: Negative.   Hematological: Negative.   Psychiatric/Behavioral: Negative.     Allergies  Patient has no known allergies.  Home Medications   Prior to Admission  medications   Medication Sig Start Date End Date Taking? Authorizing Provider  Olopatadine HCl (PATADAY) 0.2 % SOLN Apply 1 drop to eye 1 day or 1 dose. 07/13/16 07/14/16  Deatra Canterxford, Kaaliyah Kita J, FNP  terbinafine (LAMISIL) 250 MG tablet Take 1 tablet (250 mg total) by mouth daily. 07/03/16   Elvina SidleLauenstein, Kurt, MD   Meds Ordered and Administered this Visit  Medications - No data to display  BP (!) 132/96 (BP Location: Left Arm) Comment: notified rn  Pulse 94   Temp 98.7 F (37.1 C) (Oral)   Resp 16   LMP 06/26/2016   SpO2 100%  No data found.   Physical Exam  Constitutional: She appears well-developed and well-nourished.  HENT:  Head: Normocephalic and atraumatic.  Eyes: Conjunctivae are normal. Pupils are equal, round, and reactive to light.  Neck: Normal range of motion. Neck supple.  Cardiovascular: Normal rate, regular rhythm and normal heart sounds.   Pulmonary/Chest: Effort normal and breath sounds normal.  Nursing note and vitals reviewed.   Urgent Care Course     Procedures (including critical care time)  Labs Review Labs Reviewed - No data to display  Imaging Review No results found.   Visual Acuity Review  Right Eye Distance:   Left Eye Distance:   Bilateral Distance:    Right Eye Near:   Left Eye Near:  Bilateral Near:         MDM   1. Allergic conjunctivitis of left eye    Pataday eye drops     Deatra Canter, [place] 07/13/16 1646

## 2016-10-17 ENCOUNTER — Ambulatory Visit (INDEPENDENT_AMBULATORY_CARE_PROVIDER_SITE_OTHER): Payer: Self-pay

## 2016-10-17 ENCOUNTER — Ambulatory Visit (HOSPITAL_COMMUNITY)
Admission: EM | Admit: 2016-10-17 | Discharge: 2016-10-17 | Disposition: A | Discharge status: Home / Self Care | Payer: Self-pay | Attending: Family Medicine | Admitting: Family Medicine

## 2016-10-17 ENCOUNTER — Encounter (HOSPITAL_COMMUNITY): Payer: Self-pay | Admitting: Emergency Medicine

## 2016-10-17 DIAGNOSIS — M25562 Pain in left knee: Secondary | ICD-10-CM

## 2016-10-17 DIAGNOSIS — S86912A Strain of unspecified muscle(s) and tendon(s) at lower leg level, left leg, initial encounter: Secondary | ICD-10-CM

## 2016-10-17 MED ORDER — PREDNISONE 20 MG PO TABS: ORAL_TABLET | ORAL | 0 refills | Status: DC

## 2016-10-17 NOTE — ED Triage Notes (Signed)
Left knee pain for a month.  Pain and swelling did decrease, but now symptoms have worsened again.   Patient says she had knee pain prior to a fall a month ago.  Pain is lateral left knee.

## 2016-10-17 NOTE — ED Provider Notes (Signed)
[facility] CARE CENTER   [id number] 10/17/16 Arrival Time: 1454   SUBJECTIVE:  [name] [name] is a 26 y.o. female who presents to the urgent care with complaint of Left knee pain for a month.  Pain and swelling did decrease, but now symptoms have worsened again.   Patient says she had knee pain prior to a fall a month ago.  Pain is lateral left knee.    Patient works on her feet all day long at [employer]     Past Medical History:  Diagnosis Date  . Bipolar 1 disorder (HCC)   . Depression   . Gallstones and inflammation of gallbladder with obstruction 10/19/2015  . Suicidal overdose (HCC)    Family History  Problem Relation Age of Onset  . Heart attack Mother   . Stomach cancer Mother   . Hypertension Father    Social History   Social History  . Marital status: Single    Spouse name: N/A  . Number of children: 0  . Years of education: N/A   Occupational History  . Not on file.   Social History Main Topics  . Smoking status: Former Smoker    Packs/day: 0.50    Years: 1.00    Types: Cigarettes  . Smokeless tobacco: Never Used     Comment: quit 2 years ago  . Alcohol use 0.6 oz/week    1 Cans of beer per week     Comment: vodka 2-3 times weekly  . Drug use: No     Comment: Occasional  . Sexual activity: No   Other Topics Concern  . Not on file   Social History Narrative  . No narrative on file   Current Meds  Medication Sig  . acetaminophen (TYLENOL) 325 MG tablet Take 650 mg by mouth every 6 (six) hours as needed.   No Known Allergies    ROS: As per HPI, remainder of ROS negative.   OBJECTIVE:   Vitals:   10/17/16 1509  BP: (!) 127/93  Pulse: 74  Resp: 18  Temp: 97.7 F (36.5 C)  TempSrc: Oral  SpO2: 100%     General appearance: alert; no distress Eyes: PERRL; EOMI; conjunctiva normal HENT: normocephalic; atraumatic; TMs normal, canal normal, external ears normal without trauma; nasal mucosa normal; oral mucosa normal Neck:  supple Back: no CVA tenderness Extremities: left knee has a significant effusion.  Left joint line and partial right lateral joint line have localized tenderness.  Able to extend to straight and flex to 90 degress, though movement is uncomfortable.  Antalgic gait favoring left leg. Skin: warm and dry Neurologic: normal gait; grossly normal Psychological: alert and cooperative; normal mood and affect      Labs:  Results for orders placed or performed during the hospital encounter of 03/03/16  Comprehensive metabolic panel  Result Value Ref Range   Sodium 138 135 - 145 mmol/L   Potassium 3.8 3.5 - 5.1 mmol/L   Chloride 104 101 - 111 mmol/L   CO2 26 22 - 32 mmol/L   Glucose, Bld 96 65 - 99 mg/dL   BUN 9 6 - 20 mg/dL   Creatinine, Ser 6.57 0.44 - 1.00 mg/dL   Calcium 9.2 8.9 - 84.6 mg/dL   Total Protein 7.6 6.5 - 8.1 g/dL   Albumin 4.2 3.5 - 5.0 g/dL   AST 18 15 - 41 U/L   ALT 14 14 - 54 U/L   Alkaline Phosphatase 56 38 - 126 U/L   Total Bilirubin  0.9 0.3 - 1.2 mg/dL   GFR calc non Af Amer >60 >60 mL/min   GFR calc Af Amer >60 >60 mL/min   Anion gap 8 5 - 15  CBC  Result Value Ref Range   WBC 5.9 4.0 - 10.5 K/uL   RBC 4.57 3.87 - 5.11 MIL/uL   Hemoglobin 13.1 12.0 - 15.0 g/dL   HCT 69.6 29.5 - 28.4 %   MCV 88.8 78.0 - 100.0 fL   MCH 28.7 26.0 - 34.0 pg   MCHC 32.3 30.0 - 36.0 g/dL   RDW 13.2 44.0 - 10.2 %   Platelets 289 150 - 400 K/uL  I-Stat beta hCG blood, ED  Result Value Ref Range   I-stat hCG, quantitative <5.0 <5 mIU/mL   Comment 3            Labs Reviewed - No data to display  Dg Knee Complete 4 Views Left  Result Date: 10/17/2016 CLINICAL DATA:  Left knee pain.  Fall several weeks ago. EXAM: LEFT KNEE - COMPLETE 4+ VIEW COMPARISON:  None. FINDINGS: No evidence of fracture, dislocation, or joint effusion. No evidence of arthropathy or other focal bone abnormality. Soft tissues are unremarkable. IMPRESSION: No fracture, dislocation or arthropathy of the left  knee. No suprapatellar effusion. Electronically Signed   By: Deatra Robinson M.D.   On: 10/17/2016 15:30       ASSESSMENT & PLAN:  1. Strain of left knee, initial encounter     Meds ordered this encounter  Medications  . acetaminophen (TYLENOL) 325 MG tablet    Sig: Take 650 mg by mouth every 6 (six) hours as needed.  . predniSONE (DELTASONE) 20 MG tablet    Sig: Two daily with food    Dispense:  10 tablet    Refill:  0    Reviewed expectations re: course of current medical issues. Questions answered. Outlined signs and symptoms indicating need for more acute intervention. Patient verbalized understanding. After Visit Summary given.    Procedures:      Elvina Sidle, MD 10/17/16 1540

## 2016-11-06 ENCOUNTER — Encounter (HOSPITAL_COMMUNITY): Payer: Self-pay | Admitting: *Deleted

## 2016-11-06 ENCOUNTER — Emergency Department (HOSPITAL_COMMUNITY)
Admission: EM | Admit: 2016-11-06 | Discharge: 2016-11-06 | Disposition: A | Discharge status: Home / Self Care | Payer: Self-pay | Attending: Emergency Medicine | Admitting: Emergency Medicine

## 2016-11-06 DIAGNOSIS — R0981 Nasal congestion: Secondary | ICD-10-CM | POA: Insufficient documentation

## 2016-11-06 DIAGNOSIS — Z79899 Other long term (current) drug therapy: Secondary | ICD-10-CM | POA: Insufficient documentation

## 2016-11-06 DIAGNOSIS — Z87891 Personal history of nicotine dependence: Secondary | ICD-10-CM | POA: Insufficient documentation

## 2016-11-06 NOTE — Discharge Instructions (Signed)
Please read instructions below.  You can take tylenol as needed for sore throat or body aches.  Drink plenty of water.  Establish primary care, using the referral given. You can also use the phone number in the back of this paperwork. You can take Mucinex as needed for congestion. You can also try saline nasal spray. Return to the ER for difficulty swallowing liquids, difficulty breathing, or new or worsening symptoms.

## 2016-11-06 NOTE — ED Notes (Signed)
Patient able to ambulate independently  

## 2016-11-06 NOTE — ED Provider Notes (Signed)
[facility] EMERGENCY DEPARTMENT Provider Note   CSN: [id number] Arrival date & time: 11/06/16  1613     History   Chief Complaint Chief Complaint  Patient presents with  . URI    HPI [name] [name] is a 26 y.o. female with past history of bipolar 1 disorder, depression, seasonal allergies, presenting to the ED with acute onset of nasal congestion that began yesterday. Patient reports mildly sore throat. Has not tried any medications or remedies for her symptoms. She denies fever or chills, ear pain, cough, difficulty breathing or swallowing, or any other complaint. She does not have a primary care.  The history is provided by the patient.    Past Medical History:  Diagnosis Date  . Bipolar 1 disorder (HCC)   . Depression   . Gallstones and inflammation of gallbladder with obstruction 10/19/2015  . Suicidal overdose [facility])     Patient Active Problem List   Diagnosis Date Noted  . Cholelithiasis 11/04/2015  . Environmental allergies 05/28/2014  . Depression 04/29/2012    Past Surgical History:  Procedure Laterality Date  . CHOLECYSTECTOMY N/A 11/04/2015   Procedure: LAPAROSCOPIC CHOLECYSTECTOMY WITH INTRAOPERATIVE CHOLANGIOGRAM;  Surgeon: Ovidio Kin, MD;  Location: [facility] OR;  Service: General;  Laterality: N/A;    OB History    Gravida Para Term Preterm AB Living   1 1 1  0 0 1   SAB TAB Ectopic Multiple Live Births   0 0 0 0 1       Home Medications    Prior to Admission medications   Medication Sig Start Date End Date Taking? Authorizing Provider  acetaminophen (TYLENOL) 325 MG tablet Take 650 mg by mouth every 6 (six) hours as needed.    [provider]  predniSONE (DELTASONE) 20 MG tablet Two daily with food 10/17/16   Elvina Sidle, MD    Family History Family History  Problem Relation Age of Onset  . Heart attack Mother   . Stomach cancer Mother   . Hypertension Father     Social History Social History  Substance  Use Topics  . Smoking status: Former Smoker    Packs/day: 0.50    Years: 1.00    Types: Cigarettes  . Smokeless tobacco: Never Used     Comment: quit 2 years ago  . Alcohol use 0.6 oz/week    1 Cans of beer per week     Comment: vodka 2-3 times weekly     Allergies   Patient has no known allergies.   Review of Systems Review of Systems  Constitutional: Negative for chills and fever.  HENT: Positive for congestion and sore throat. Negative for ear pain, trouble swallowing and voice change.   Respiratory: Negative for cough.      Physical Exam Updated Vital Signs BP 124/89 (BP Location: Left Arm)   Pulse 78   Temp 98.4 F (36.9 C) (Oral)   Resp 18   LMP 10/30/2016   SpO2 100%   Physical Exam  Constitutional: She appears well-developed and well-nourished. No distress.  Well-appearing, tolerating secretions  HENT:  Head: Normocephalic and atraumatic.  Right Ear: Hearing, tympanic membrane, external ear and ear canal normal.  Left Ear: Hearing, tympanic membrane, external ear and ear canal normal.  Nose: Nose normal.  Mouth/Throat: Uvula is midline and oropharynx is clear and moist. No trismus in the jaw. No uvula swelling.  Eyes: Conjunctivae are normal.  Neck: Normal range of motion. Neck supple. No tracheal deviation present.  Cardiovascular:  Normal rate, regular rhythm, normal heart sounds and intact distal pulses.   Pulmonary/Chest: Effort normal and breath sounds normal. No stridor. No respiratory distress. She has no wheezes. She has no rales.  Lymphadenopathy:    She has no cervical adenopathy.  Psychiatric: She has a normal mood and affect. Her behavior is normal.  Nursing note and vitals reviewed.    ED Treatments / Results  Labs (all labs ordered are listed, but only abnormal results are displayed) Labs Reviewed - No data to display  EKG  EKG Interpretation None       Radiology No results found.  Procedures Procedures (including critical  care time)  Medications Ordered in ED Medications - No data to display   Initial Impression / Assessment and Plan / ED Course  I have reviewed the triage vital signs and the nursing notes.  Pertinent labs & imaging results that were available during my care of the patient were reviewed by me and considered in my medical decision making (see chart for details).     Patient presenting with 1 day of nasal congestion, clear viral etiology.  ENT exam unremarkable. Lungs clear to auscultation. Patient is afebrile, tolerating secretions. Discussed that antibiotics are not indicated for viral infections. Pt will be discharged with symptomatic treatment. Pt is hemodynamically stable & in NAD prior to dc.  Discussed results, findings, treatment and follow up. Patient advised of return precautions. Patient verbalized understanding and agreed with plan.  Final Clinical Impressions(s) / ED Diagnoses   Final diagnoses:  Nasal congestion    New Prescriptions New Prescriptions   No medications on file     Russo, Swazil[place]Jordan N, PA-C 11/06/16 1813    Mancel BaleWentz, Elliott, MD 11/07/16 1049

## 2016-11-06 NOTE — ED Notes (Signed)
PA at bedside.

## 2016-11-06 NOTE — ED Triage Notes (Signed)
Pt reports having sinus congestion since yesterday. Denies fever. No distress noted at triage.

## 2016-11-20 ENCOUNTER — Emergency Department (HOSPITAL_COMMUNITY)
Admission: EM | Admit: 2016-11-20 | Discharge: 2016-11-21 | Disposition: A | Discharge status: Home / Self Care | Payer: Self-pay | Attending: Emergency Medicine | Admitting: Emergency Medicine

## 2016-11-20 ENCOUNTER — Encounter (HOSPITAL_COMMUNITY): Payer: Self-pay | Admitting: Emergency Medicine

## 2016-11-20 ENCOUNTER — Other Ambulatory Visit: Payer: Self-pay

## 2016-11-20 ENCOUNTER — Emergency Department (HOSPITAL_COMMUNITY): Payer: Self-pay

## 2016-11-20 DIAGNOSIS — Z79899 Other long term (current) drug therapy: Secondary | ICD-10-CM | POA: Insufficient documentation

## 2016-11-20 DIAGNOSIS — Z87891 Personal history of nicotine dependence: Secondary | ICD-10-CM | POA: Insufficient documentation

## 2016-11-20 DIAGNOSIS — R102 Pelvic and perineal pain: Secondary | ICD-10-CM | POA: Insufficient documentation

## 2016-11-20 LAB — WET PREP, GENITAL
CLUE CELLS WET PREP: NONE SEEN
Sperm: NONE SEEN
TRICH WET PREP: NONE SEEN
YEAST WET PREP: NONE SEEN

## 2016-11-20 LAB — I-STAT BETA HCG BLOOD, ED (MC, WL, AP ONLY): I-stat hCG, quantitative: <5 m[IU]/mL (ref <5)

## 2016-11-20 LAB — URINALYSIS, ROUTINE W REFLEX MICROSCOPIC
BILIRUBIN URINE: NEGATIVE
Glucose, UA: NEGATIVE mg/dL
Hgb urine dipstick: NEGATIVE
Ketones, ur: NEGATIVE mg/dL
Leukocytes, UA: NEGATIVE
NITRITE: NEGATIVE
PH: 7 (ref 5.0–8.0)
Protein, ur: NEGATIVE mg/dL
SPECIFIC GRAVITY, URINE: 1.025 (ref 1.005–1.030)

## 2016-11-20 LAB — COMPREHENSIVE METABOLIC PANEL
ALBUMIN: 3.9 g/dL (ref 3.5–5.0)
ALT: 14 U/L (ref 14–54)
ANION GAP: 5 (ref 5–15)
AST: 15 U/L (ref 15–41)
Alkaline Phosphatase: 50 U/L (ref 38–126)
BILIRUBIN TOTAL: 0.5 mg/dL (ref 0.3–1.2)
BUN: 9 mg/dL (ref 6–20)
CO2: 27 mmol/L (ref 22–32)
Calcium: 8.9 mg/dL (ref 8.9–10.3)
Chloride: 104 mmol/L (ref 101–111)
Creatinine, Ser: 0.68 mg/dL (ref 0.44–1.00)
GFR calc Af Amer: >60 mL/min (ref >60)
GFR calc non Af Amer: >60 mL/min (ref >60)
GLUCOSE: 88 mg/dL (ref 65–99)
POTASSIUM: 4.3 mmol/L (ref 3.5–5.1)
SODIUM: 136 mmol/L (ref 135–145)
TOTAL PROTEIN: 7.1 g/dL (ref 6.5–8.1)

## 2016-11-20 LAB — CBC
HEMATOCRIT: 37.5 % (ref 36.0–46.0)
HEMOGLOBIN: 11.9 g/dL — AB (ref 12.0–15.0)
MCH: 28.5 pg (ref 26.0–34.0)
MCHC: 31.7 g/dL (ref 30.0–36.0)
MCV: 89.7 fL (ref 78.0–100.0)
Platelets: 288 10*3/uL (ref 150–400)
RBC: 4.18 MIL/uL (ref 3.87–5.11)
RDW: 13.1 % (ref 11.5–15.5)
WBC: 5.2 10*3/uL (ref 4.0–10.5)

## 2016-11-20 LAB — LIPASE, BLOOD: Lipase: 25 U/L (ref 11–51)

## 2016-11-20 MED ORDER — AZITHROMYCIN 250 MG PO TABS: 500.0000 mg | ORAL_TABLET | Freq: Once | ORAL | Status: DC

## 2016-11-20 MED ORDER — IBUPROFEN 400 MG PO TABS
600.0000 mg | ORAL_TABLET | Freq: Once | ORAL | Status: AC
  Administered 2016-11-20: 600 mg via ORAL
  Filled 2016-11-20: qty 1

## 2016-11-20 MED ORDER — AZITHROMYCIN 250 MG PO TABS
1000.0000 mg | ORAL_TABLET | Freq: Once | ORAL | Status: AC
  Administered 2016-11-20: 1000 mg via ORAL
  Filled 2016-11-20: qty 4

## 2016-11-20 MED ORDER — CEFTRIAXONE SODIUM 250 MG IJ SOLR
250.0000 mg | Freq: Once | INTRAMUSCULAR | Status: AC
  Administered 2016-11-20: 250 mg via INTRAMUSCULAR
  Filled 2016-11-20: qty 250

## 2016-11-20 MED ORDER — STERILE WATER FOR INJECTION IJ SOLN
INTRAMUSCULAR | Status: AC
  Administered 2016-11-20: 10 mL
  Filled 2016-11-20: qty 10

## 2016-11-20 MED ORDER — ONDANSETRON HCL 4 MG PO TABS
4.0000 mg | ORAL_TABLET | Freq: Once | ORAL | Status: AC
  Administered 2016-11-20: 4 mg via ORAL
  Filled 2016-11-20: qty 1

## 2016-11-20 NOTE — ED Notes (Signed)
Patient transported to Ultrasound 

## 2016-11-20 NOTE — ED Triage Notes (Signed)
crampy abd paian x 1 week denies n/v/d or dysuria, pain is all over denies d/c

## 2016-11-20 NOTE — ED Provider Notes (Signed)
Planes of left-sided suprapubic pain onset 1 week ago.  Denies change in appetite no fever.  Other associated symptoms include vaginal discharge for 2 days.  She is 20 days late for her menstrual period.  On exam alert no distress she is eating graham crackers as I examine her.  Abdomen obese minimally tender at left suprapubic area without guarding rigidity or rebound   Doug SouJacubowitz, Hudson Majkowski, MD 11/20/16 2100

## 2016-11-20 NOTE — ED Provider Notes (Signed)
[facility]  CSN: [id number] Arrival date & time: 11/20/16  1246 History   Chief Complaint Chief Complaint  Patient presents with  . Abdominal Pain    HPI [name] is a 26 y.o. female.  The history is provided by the patient.  Abdominal Pain   This is a new problem. The current episode started more than 1 week ago. The problem occurs constantly. The problem has not changed since onset.The pain is located in the LLQ and RLQ. The quality of the pain is cramping. The pain is mild. Pertinent negatives include anorexia, fever, diarrhea, flatus, melena, nausea, vomiting, constipation, dysuria, frequency, hematuria, arthralgias and myalgias. Nothing aggravates the symptoms. Nothing relieves the symptoms.  Patient concerned that her menstrual cycle is late.   Past Medical History:  Diagnosis Date  . Bipolar 1 disorder (HCC)   . Depression   . Gallstones and inflammation of gallbladder with obstruction 10/19/2015  . Suicidal overdose [facility](HCC)    Patient Active Problem List   Diagnosis Date Noted  . Cholelithiasis 11/04/2015  . Environmental allergies 05/28/2014  . Depression 04/29/2012   History reviewed. No pertinent surgical history.  OB History    Gravida Para Term Preterm AB Living   1 1 1  0 0 1   SAB TAB Ectopic Multiple Live Births   0 0 0 0 1     Home Medications    Prior to Admission medications   Medication Sig Start Date End Date Taking? Authorizing Provider  acetaminophen (TYLENOL) 325 MG tablet Take 650 mg by mouth every 6 (six) hours as needed.    [provider]  predniSONE (DELTASONE) 20 MG tablet Two daily with food 10/17/16   Elvina SidleLauenstein, Kurt, MD    Family History Family History  Problem Relation Age of Onset  . Heart attack Mother   . Stomach cancer Mother   . Hypertension Father    Social History Social History   Tobacco Use  . Smoking status: Former Smoker    Packs/day: 0.50   Years: 1.00    Pack years: 0.50    Types: Cigarettes  . Smokeless tobacco: Never Used  . Tobacco comment: quit 2 years ago  Substance Use Topics  . Alcohol use: Yes    Alcohol/week: 0.6 oz    Types: 1 Cans of beer per week    Comment: vodka 2-3 times weekly  . Drug use: No    Comment: Occasional   Allergies   Patient has no known allergies.  Review of Systems Review of Systems  Constitutional: Negative for chills and fever.  HENT: Negative for ear pain and sore throat.   Eyes: Negative for pain and visual disturbance.  Respiratory: Negative for cough and shortness of breath.   Cardiovascular: Negative for chest pain and palpitations.  Gastrointestinal: Positive for abdominal pain. Negative for anorexia, constipation, diarrhea, flatus, melena, nausea and vomiting.  Genitourinary: Positive for pelvic pain. Negative for dysuria, frequency and hematuria.  Musculoskeletal: Negative for arthralgias, back pain and myalgias.  Skin: Negative for color change and rash.  Neurological: Negative for seizures and syncope.  All other systems reviewed and are negative.  Physical Exam Updated Vital Signs BP (!) 149/98 (BP Location: Right Arm)   Pulse 62   Temp 98.3 F (36.8 C) (Oral)   Resp 18   LMP 10/30/2016   SpO2 100%   Physical Exam  Constitutional: She appears well-developed and well-nourished. No distress.  HENT:  Head: Normocephalic and atraumatic.  Eyes: Conjunctivae and EOM are normal. Pupils are equal, round, and reactive to light.  Neck: Neck supple.  Cardiovascular: Normal rate and regular rhythm.  No murmur heard. Pulmonary/Chest: Effort normal and breath sounds normal. No respiratory distress.  Abdominal: Soft. Normal appearance and bowel sounds are normal. There is tenderness (suprapubic and lower abdomen) in the suprapubic area.  Genitourinary: Vagina normal and uterus normal. Cervix exhibits no motion tenderness and no friability. Right adnexum displays no mass and  no fullness. Left adnexum displays no mass and no fullness.  Musculoskeletal: She exhibits no edema.  Neurological: She is alert.  Skin: Skin is warm and dry.  Psychiatric: She has a normal mood and affect.  Nursing note and vitals reviewed.  ED Treatments / Results  Labs (all labs ordered are listed, but only abnormal results are displayed) Labs Reviewed  WET PREP, GENITAL - Abnormal; Notable for the following components:      Result Value   WBC, Wet Prep HPF POC MODERATE (*)    All other components within normal limits  CBC - Abnormal; Notable for the following components:   Hemoglobin 11.9 (*)    All other components within normal limits  LIPASE, BLOOD  COMPREHENSIVE METABOLIC PANEL  URINALYSIS, ROUTINE W REFLEX MICROSCOPIC  I-STAT BETA HCG BLOOD, ED ([facility], [facility], [facility] ONLY)  GC/CHLAMYDIA PROBE AMP ([place]) NOT AT [facility]   EKG  EKG Interpretation None      Radiology No results found.  Procedures Procedures (including critical care time)  Medications Ordered in ED Medications  ibuprofen (ADVIL,MOTRIN) tablet 600 mg (not administered)   Initial Impression / Assessment and Plan / ED Course  I have reviewed the triage vital signs and the nursing notes.  Pertinent labs & imaging results that were available during my care of the patient were reviewed by me and considered in my medical decision making (see chart for details).  [name] is a 26 y.o. female without significant PMHx who presented with pelvic pain    On my assessment of the patient she is resting comfortably in bed, in no acute distress. She is hemodynamically stable. Mild tenderness in the suprapubic region.   Labs studies unremarkable Pelvic US: normal  Other possible causes for abdominal pain that were considered include Appendicitis, Bowel Obstruction, AAA, UTI, Pyelonephritis, Nephrolithiasis, Pancreatitis, Cholecystitis, Shingles, Perforated Bowel or Ulcer, Diverticulosis/itis, Ischemic Mesentery,  Inflammatory Bowel Disease, Strangulated/Incarcerated Hernia, Intrauterine Pregnancy, Ectopic Pregnancy, Ovarian Torsion, Tubal Ovarian Abscess,however at this time doubt these or other abdominal emergency as this would be inconsistent with history and physical, low risk, other diagnosis much more likely.   Patient treated with ceftriaxone and azithromycin for STI prophylaxis  Discussed HIV and syphilis testing with the patient who was in agreement.   Dc home, return precautions given, follow up with primary care provider, patient in agreement with plan.  The plan for this patient was discussed with Dr. Ethelda Chick, who voiced agreement and who oversaw evaluation and treatment of this patient.    Final Clinical Impressions(s) / ED Diagnoses   Final diagnoses:  Pelvic pain in female  Pelvic pain    ED Discharge Orders    None       Lamont Snowball, MD 11/20/16 1610    Doug Sou, MD 11/21/16 0001

## 2016-11-21 LAB — RPR: RPR Ser Ql: NONREACTIVE

## 2016-11-21 LAB — GC/CHLAMYDIA PROBE AMP (~~LOC~~) NOT AT ARMC
Chlamydia: NEGATIVE
Neisseria Gonorrhea: NEGATIVE

## 2016-11-21 LAB — HIV ANTIBODY (ROUTINE TESTING W REFLEX): HIV Screen 4th Generation wRfx: NONREACTIVE

## 2016-11-27 ENCOUNTER — Ambulatory Visit: Payer: Self-pay | Admitting: Obstetrics & Gynecology

## 2016-11-27 ENCOUNTER — Encounter: Payer: Self-pay | Admitting: *Deleted

## 2016-11-27 NOTE — Progress Notes (Signed)
[name] KnifeJustina DNKA appointment for gyn appointment for abd pain. Per discussion with Dr. Debroah LoopArnold- no need to call patient, may reschedule if she calls.

## 2017-02-02 ENCOUNTER — Encounter (HOSPITAL_COMMUNITY): Payer: Self-pay | Admitting: *Deleted

## 2017-02-02 ENCOUNTER — Emergency Department (HOSPITAL_COMMUNITY)
Admission: EM | Admit: 2017-02-02 | Discharge: 2017-02-02 | Disposition: A | Discharge status: Home / Self Care | Payer: Medicaid Other | Attending: Emergency Medicine | Admitting: Emergency Medicine

## 2017-02-02 ENCOUNTER — Other Ambulatory Visit: Payer: Self-pay

## 2017-02-02 DIAGNOSIS — Z87891 Personal history of nicotine dependence: Secondary | ICD-10-CM | POA: Insufficient documentation

## 2017-02-02 DIAGNOSIS — K0889 Other specified disorders of teeth and supporting structures: Secondary | ICD-10-CM | POA: Insufficient documentation

## 2017-02-02 DIAGNOSIS — I10 Essential (primary) hypertension: Secondary | ICD-10-CM | POA: Insufficient documentation

## 2017-02-02 DIAGNOSIS — Z79899 Other long term (current) drug therapy: Secondary | ICD-10-CM | POA: Insufficient documentation

## 2017-02-02 HISTORY — DX: Essential (primary) hypertension: I10

## 2017-02-02 MED ORDER — HYDROCODONE-ACETAMINOPHEN 5-325 MG PO TABS: 1.0000 | ORAL_TABLET | Freq: Once | ORAL | Status: DC

## 2017-02-02 MED ORDER — BUPIVACAINE HCL 0.5 % IJ SOLN
20.0000 mL | Freq: Once | INTRAMUSCULAR | Status: AC
  Administered 2017-02-02: 20 mL
  Filled 2017-02-02 (×2): qty 20

## 2017-02-02 MED ORDER — PENICILLIN V POTASSIUM 500 MG PO TABS: 500.0000 mg | ORAL_TABLET | Freq: Four times a day (QID) | ORAL | 0 refills | Status: AC

## 2017-02-02 MED ORDER — HYDROCODONE-ACETAMINOPHEN 5-325 MG PO TABS
2.0000 | ORAL_TABLET | Freq: Once | ORAL | Status: AC
  Administered 2017-02-02: 2 via ORAL
  Filled 2017-02-02: qty 2

## 2017-02-02 NOTE — ED Provider Notes (Signed)
[facility] EMERGENCY DEPARTMENT Provider Note   CSN: [id number] Arrival date & time: 02/02/17  1813     History   Chief Complaint Chief Complaint  Patient presents with  . Dental Pain    HPI [name] [name] is a 27 y.o. female who presents for evaluation of dental pain that is been ongoing for the last month.  Patient reports that initially, she had some dental pain after a molar cracked.  She reports that it is been manageable up until a few days ago.  She states that since then, she has had worsening pain.  She has been taking Tylenol for pain relief, with minimal improvement in symptoms.  Patient does not have a dentist and has not had follow-up since symptoms began.  Patient reports no difficulty swallowing her secretions are tolerating p.o.  She has not had any fever, facial swelling, neck swelling, difficulty breathing.  The history is provided by the patient.    Past Medical History:  Diagnosis Date  . Bipolar 1 disorder (HCC)   . Depression   . Gallstones and inflammation of gallbladder with obstruction 10/19/2015  . Hypertension   . Suicidal overdose [facility])     Patient Active Problem List   Diagnosis Date Noted  . Cholelithiasis 11/04/2015  . Environmental allergies 05/28/2014  . Depression 04/29/2012    Past Surgical History:  Procedure Laterality Date  . CHOLECYSTECTOMY N/A 11/04/2015   Procedure: LAPAROSCOPIC CHOLECYSTECTOMY WITH INTRAOPERATIVE CHOLANGIOGRAM;  Surgeon: Ovidio Kin, MD;  Location: [facility] OR;  Service: General;  Laterality: N/A;    OB History    Gravida Para Term Preterm AB Living   1 1 1  0 0 1   SAB TAB Ectopic Multiple Live Births   0 0 0 0 1       Home Medications    Prior to Admission medications   Medication Sig Start Date End Date Taking? Authorizing Provider  penicillin v potassium (VEETID) 500 MG tablet Take 1 tablet (500 mg total) by mouth 4 (four) times daily for 7 days. 02/02/17 02/09/17  Maxwell Caul,  PA-C  predniSONE (DELTASONE) 20 MG tablet Two daily with food Patient not taking: Reported on 11/20/2016 10/17/16   Elvina Sidle, MD    Family History Family History  Problem Relation Age of Onset  . Heart attack Mother   . Stomach cancer Mother   . Hypertension Father     Social History Social History   Tobacco Use  . Smoking status: Former Smoker    Packs/day: 0.50    Years: 1.00    Pack years: 0.50    Types: Cigarettes  . Smokeless tobacco: Never Used  . Tobacco comment: quit 2 years ago  Substance Use Topics  . Alcohol use: Yes    Alcohol/week: 0.6 oz    Types: 1 Cans of beer per week    Comment: vodka 2-3 times weekly  . Drug use: No    Comment: Occasional     Allergies   Patient has no known allergies.   Review of Systems Review of Systems  Constitutional: Negative for fever.  HENT: Positive for dental problem. Negative for drooling and trouble swallowing.      Physical Exam Updated Vital Signs BP (!) 142/104   Pulse 72   Temp 98 F (36.7 C)   Resp 18   Ht 5\' 6"  (1.676 m)   Wt 90.7 kg (200 lb)   LMP 02/02/2017   BMI 32.28 kg/m   Physical Exam  Constitutional: She appears well-developed and well-nourished.  HENT:  Head: Normocephalic and atraumatic.  Mouth/Throat:    Poor dentition throughout.  Scattered dental caries throughout.  No gingival erythema or swelling noted.  Bilateral wisdom teeth are erupted.  Uvula is midline.  No trismus.  No face or neck swelling.  Eyes: Conjunctivae and EOM are normal. Right eye exhibits no discharge. Left eye exhibits no discharge. No scleral icterus.  Pulmonary/Chest: Effort normal.  Neurological: She is alert.  Skin: Skin is warm and dry.  Psychiatric: She has a normal mood and affect. Her speech is normal and behavior is normal.  Nursing note and vitals reviewed.    ED Treatments / Results  Labs (all labs ordered are listed, but only abnormal results are displayed) Labs Reviewed  POC URINE  PREG, ED    EKG  EKG Interpretation None       Radiology No results found.  Procedures Dental Block Date/Time: 02/02/2017 11:39 PM Performed by: Maxwell CaulLayden, Lindsey A, PA-C Authorized by: Maxwell CaulLayden, Lindsey A, PA-C   Consent:    Consent obtained:  Verbal   Consent given by:  Patient   Risks discussed:  Infection, nerve damage, swelling and pain Indications:    Indications: dental pain   Location:    Block type:  Supraperiosteal   Supraperiosteal location:  Lower teeth Procedure details (see MAR for exact dosages):    Topical anesthetic:  Benzocaine spray   Needle gauge:  27 G   Anesthetic injected:  Bupivacaine 0.5% w/o epi   Injection procedure:  Anatomic landmarks identified, introduced needle, incremental injection and negative aspiration for blood Post-procedure details:    Outcome:  Pain relieved   Patient tolerance of procedure:  Tolerated well, no immediate complications   (including critical care time)  Medications Ordered in ED Medications  bupivacaine (MARCAINE) 0.5 % (with pres) injection 20 mL (20 mLs Infiltration Given 02/02/17 2215)  HYDROcodone-acetaminophen (NORCO/VICODIN) 5-325 MG per tablet 2 tablet (2 tablets Oral Given 02/02/17 2221)     Initial Impression / Assessment and Plan / ED Course  I have reviewed the triage vital signs and the nursing notes.  Pertinent labs & imaging results that were available during my care of the patient were reviewed by me and considered in my medical decision making (see chart for details).     27 y.o. F presents with dental pain that has been going on for a month.  Does not have dental follow-up.  On exam, patient does have a cracked molar.  She also has wisdom teeth that appear to be erupted.  No evidence of abscess requiring immediate incision and drainage. Exam not concerning for Ludwig's angina or pharyngeal abscess.  Patient requesting a dental block here in the emergency department.    Dental block as documented  above.  Patient had pain improvement after dental block. Will treat with antibiotic therapy. Patient with no known drug allergies. Patient instructed to follow-up with dentist referral provided. Stable for discharge at this time. Patient had ample opportunity for questions and discussion. All patient's questions were answered with full understanding. Strict return precautions discussed. Patient expresses understanding and agreement to plan.  Final Clinical Impressions(s) / ED Diagnoses   Final diagnoses:  Pain, dental    ED Discharge Orders        Ordered    penicillin v potassium (VEETID) 500 MG tablet  4 times daily     02/02/17 2301       Maxwell CaulLayden, Lindsey A, PA-C 02/02/17 2341  Eber Hong, MD 02/03/17 [phone]

## 2017-02-02 NOTE — ED Triage Notes (Signed)
Toothache  Her tooth broke off one month ago pain since then  She left work today to come in  lmp last month

## 2017-02-02 NOTE — Discharge Instructions (Signed)
Please follow-up with one of the dental clinics provided to you below or in your paperwork. Call and tell them you were seen in the Emergency Dept and arrange for an appointment. You may have to call multiple places in order to find a place to be seen.  Dental Assistance If the dentist on-call cannot see you, please use the resources below:   Patients with Medicaid: [facility] [phone], [phone] [address], [phone]  If unable to pay, or uninsured, contact [facility] [phone]) or [facility] [phone] in [place], [phone] in [facility]) to become qualified for the adult dental clinic  Other Low-Cost Community Dental Services: [facility]- [address], [place], [place], [address]    [phone], Ext. [phone]    2nd and 4th Thursday of the month at 6:30am    10 clients each day by appointment, can sometimes see walk-in     patients if someone does not show for an appointment [facility]- [address], [place], [place], [address]    [phone] [facility]- [address], [place], [place], [address]    [phone]  [facility]- [phone] [facility]- [phone] [phone]- [phone]    Take the medications as directed.   You can take Tylenol or Ibuprofen as directed for pain. You can alternate Tylenol and Ibuprofen every 4 hours. If you take Tylenol at 1pm, then you can take Ibuprofen at 5pm. Then you can take Tylenol again at 9pm.   The exam and treatment you received today has been provided on an emergency basis only. This is not a substitute for complete medical or dental care. If your problem worsens or new symptoms (problems) appear, and you are unable to arrange prompt follow-up care with your dentist, call or return to this location. If you do not have a dentist, please follow-up with one on the list provided  CALL  YOUR DENTIST OR RETURN IMMEDIATELY IF you develop a fever, rash, difficulty breathing or swallowing, neck or facial swelling, or other potentially serious concerns.

## 2017-02-04 ENCOUNTER — Encounter (HOSPITAL_COMMUNITY): Payer: Self-pay | Admitting: *Deleted

## 2017-02-04 ENCOUNTER — Other Ambulatory Visit: Payer: Self-pay

## 2017-02-04 ENCOUNTER — Emergency Department (HOSPITAL_COMMUNITY)
Admission: EM | Admit: 2017-02-04 | Discharge: 2017-02-04 | Disposition: A | Discharge status: Home / Self Care | Payer: Medicaid Other | Attending: Emergency Medicine | Admitting: Emergency Medicine

## 2017-02-04 DIAGNOSIS — Z87891 Personal history of nicotine dependence: Secondary | ICD-10-CM | POA: Insufficient documentation

## 2017-02-04 DIAGNOSIS — T391X1A Poisoning by 4-Aminophenol derivatives, accidental (unintentional), initial encounter: Secondary | ICD-10-CM | POA: Insufficient documentation

## 2017-02-04 DIAGNOSIS — K0889 Other specified disorders of teeth and supporting structures: Secondary | ICD-10-CM

## 2017-02-04 DIAGNOSIS — I10 Essential (primary) hypertension: Secondary | ICD-10-CM | POA: Insufficient documentation

## 2017-02-04 DIAGNOSIS — K920 Hematemesis: Secondary | ICD-10-CM

## 2017-02-04 LAB — COMPREHENSIVE METABOLIC PANEL
ALT: 46 U/L (ref 14–54)
ALT: 55 U/L — AB (ref 14–54)
AST: 34 U/L (ref 15–41)
AST: 53 U/L — AB (ref 15–41)
Albumin: 4 g/dL (ref 3.5–5.0)
Albumin: 3.8 g/dL (ref 3.5–5.0)
Alkaline Phosphatase: 57 U/L (ref 38–126)
Alkaline Phosphatase: 54 U/L (ref 38–126)
Anion gap: 9 (ref 5–15)
Anion gap: 8 (ref 5–15)
BILIRUBIN TOTAL: 0.7 mg/dL (ref 0.3–1.2)
BUN: 11 mg/dL (ref 6–20)
BUN: 9 mg/dL (ref 6–20)
CHLORIDE: 103 mmol/L (ref 101–111)
CHLORIDE: 105 mmol/L (ref 101–111)
CO2: 24 mmol/L (ref 22–32)
CO2: 24 mmol/L (ref 22–32)
CREATININE: 0.75 mg/dL (ref 0.44–1.00)
Calcium: 9.2 mg/dL (ref 8.9–10.3)
Calcium: 9.1 mg/dL (ref 8.9–10.3)
Creatinine, Ser: 0.74 mg/dL (ref 0.44–1.00)
GFR calc Af Amer: >60 mL/min (ref >60)
GFR calc Af Amer: >60 mL/min (ref >60)
GFR calc non Af Amer: >60 mL/min (ref >60)
GLUCOSE: 93 mg/dL (ref 65–99)
Glucose, Bld: 104 mg/dL — ABNORMAL HIGH (ref 65–99)
POTASSIUM: 4.1 mmol/L (ref 3.5–5.1)
Potassium: 3.7 mmol/L (ref 3.5–5.1)
Sodium: 138 mmol/L (ref 135–145)
Sodium: 135 mmol/L (ref 135–145)
Total Bilirubin: 0.6 mg/dL (ref 0.3–1.2)
Total Protein: 6.9 g/dL (ref 6.5–8.1)
Total Protein: 6.6 g/dL (ref 6.5–8.1)

## 2017-02-04 LAB — POC URINE PREG, ED: Preg Test, Ur: NEGATIVE

## 2017-02-04 LAB — CBC
HCT: 34.8 % — ABNORMAL LOW (ref 36.0–46.0)
Hemoglobin: 11.3 g/dL — ABNORMAL LOW (ref 12.0–15.0)
MCH: 28.9 pg (ref 26.0–34.0)
MCHC: 32.5 g/dL (ref 30.0–36.0)
MCV: 89 fL (ref 78.0–100.0)
PLATELETS: 246 10*3/uL (ref 150–400)
RBC: 3.91 MIL/uL (ref 3.87–5.11)
RDW: 12.8 % (ref 11.5–15.5)
WBC: 5.1 10*3/uL (ref 4.0–10.5)

## 2017-02-04 LAB — ACETAMINOPHEN LEVEL
Acetaminophen (Tylenol), Serum: <10 ug/mL — ABNORMAL LOW (ref 10–30)
Acetaminophen (Tylenol), Serum: <10 ug/mL — ABNORMAL LOW (ref 10–30)

## 2017-02-04 MED ORDER — OXYCODONE-ACETAMINOPHEN 5-325 MG PO TABS
1.0000 | ORAL_TABLET | ORAL | Status: DC | PRN
  Administered 2017-02-04: 1 via ORAL
  Filled 2017-02-04: qty 1

## 2017-02-04 MED ORDER — IBUPROFEN 600 MG PO TABS: 600.0000 mg | ORAL_TABLET | Freq: Four times a day (QID) | ORAL | 0 refills | Status: DC | PRN

## 2017-02-04 MED ORDER — FAMOTIDINE 20 MG PO TABS: 20.0000 mg | ORAL_TABLET | Freq: Two times a day (BID) | ORAL | 0 refills | Status: DC

## 2017-02-04 MED ORDER — PENICILLIN V POTASSIUM 250 MG PO TABS
500.0000 mg | ORAL_TABLET | Freq: Once | ORAL | Status: AC
  Administered 2017-02-04: 500 mg via ORAL
  Filled 2017-02-04: qty 2

## 2017-02-04 MED ORDER — ACETYLCYSTEINE 20 % IN SOLN
140.0000 mg/kg | Freq: Once | RESPIRATORY_TRACT | Status: AC
  Administered 2017-02-04: 12700 mg via ORAL
  Filled 2017-02-04: qty 90

## 2017-02-04 MED ORDER — ONDANSETRON 4 MG PO TBDP
8.0000 mg | ORAL_TABLET | Freq: Once | ORAL | Status: AC
  Administered 2017-02-04: 8 mg via ORAL
  Filled 2017-02-04: qty 2

## 2017-02-04 MED ORDER — KETOROLAC TROMETHAMINE 30 MG/ML IJ SOLN
15.0000 mg | Freq: Once | INTRAMUSCULAR | Status: AC
  Administered 2017-02-04: 15 mg via INTRAMUSCULAR
  Filled 2017-02-04: qty 1

## 2017-02-04 MED ORDER — PANTOPRAZOLE SODIUM 40 MG PO TBEC
40.0000 mg | DELAYED_RELEASE_TABLET | Freq: Once | ORAL | Status: AC
  Administered 2017-02-04: 40 mg via ORAL
  Filled 2017-02-04: qty 1

## 2017-02-04 MED ORDER — KETOROLAC TROMETHAMINE 30 MG/ML IJ SOLN: 30.0000 mg | Freq: Once | INTRAMUSCULAR | Status: DC

## 2017-02-04 MED ORDER — ONDANSETRON 4 MG PO TBDP
4.0000 mg | ORAL_TABLET | Freq: Once | ORAL | Status: AC
  Administered 2017-02-04: 4 mg via ORAL
  Filled 2017-02-04: qty 1

## 2017-02-04 MED ORDER — ONDANSETRON HCL 4 MG PO TABS: 4.0000 mg | ORAL_TABLET | Freq: Four times a day (QID) | ORAL | 0 refills | Status: DC

## 2017-02-04 MED ORDER — ONDANSETRON HCL 4 MG/2ML IJ SOLN: 4.0000 mg | Freq: Once | INTRAMUSCULAR | Status: DC

## 2017-02-04 MED ORDER — GI COCKTAIL ~~LOC~~
30.0000 mL | Freq: Once | ORAL | Status: AC
  Administered 2017-02-04: 30 mL via ORAL
  Filled 2017-02-04: qty 30

## 2017-02-04 NOTE — Discharge Instructions (Signed)
Medications: Ibuprofen, Zofran, Pepcid  Treatment: Take ibuprofen only every 6 hours as prescribed with food.  Do not take more than prescribed.  Take Zofran every 6 hours as needed for nausea or vomiting.  Take Pepcid twice daily to help protect your stomach.  Start taking the penicillin that was prescribed to you yesterday.  Follow-up: Please follow-up with a dentist as soon as possible for further evaluation and treatment of your symptoms.  Attached are dental resources.  In addition, you can also try to contact: [facility] [employer]  [employer] [facility]  [address]  [place], [place] [address]  Phone [phone]

## 2017-02-04 NOTE — ED Triage Notes (Signed)
Pt was seen lst night for dental pain, was given an injection and percocet and instructed to take ibuprofen every 4 hours. Pt reports emesis with blood in it today

## 2017-02-04 NOTE — ED Provider Notes (Signed)
[facility]ENC[facility] Provider Note   CSN: [id number] Arrival date & time: 02/04/17  0025     History   Chief Complaint Chief Complaint  Patient presents with  . Emesis  . Dental Pain    HPI [name] [name] is a 27 y.o. female with history of bipolar 1 disorder, hypertension who presents with a one-month history of dental pain from a cracked molar that has become increasingly painful over the past few days.  Patient was seen last evening prescribed penicillin, which she has not had a chance to fill yet.  She was given dental block and hydrocodone.  She has been taking ibuprofen and Tylenol at home for pain.  She has been taking more than prescribed over-the-counter.  She reports that she took 10, 500 mg tablets of Tylenol while waiting in the waiting room yesterday.  She is also been taking more ibuprofen than prescribed, however she did not specify how often.  She returns because she had 2 episodes of blood-tinged vomiting.  She reports the second episode was more blood than stomach contents.  She had some upper abdominal pain related.  She reports the Percocet given to her in triage for this visit, made her stomach hurt again.  She reports associated swelling to her right lower jaw.  She denies any fever.  She was able to eat [employer] yesterday after her visit.  Patient has taken 10,825 mg of Tylenol in the past 24-36 hours in attempt to control her pain.  She has taken 400 mg of ibuprofen.  HPI  Past Medical History:  Diagnosis Date  . Bipolar 1 disorder (HCC)   . Depression   . Gallstones and inflammation of gallbladder with obstruction 10/19/2015  . Hypertension   . Suicidal overdose [facility](HCC)     Patient Active Problem List   Diagnosis Date Noted  . Cholelithiasis 11/04/2015  . Environmental allergies 05/28/2014  . Depression 04/29/2012    Past Surgical History:  Procedure Laterality Date  . CHOLECYSTECTOMY N/A 11/04/2015   Procedure: LAPAROSCOPIC  CHOLECYSTECTOMY WITH INTRAOPERATIVE CHOLANGIOGRAM;  Surgeon: Ovidio Kinavid Newman, MD;  Location: [facility] OR;  Service: General;  Laterality: N/A;    OB History    Gravida Para Term Preterm AB Living   1 1 1  0 0 1   SAB TAB Ectopic Multiple Live Births   0 0 0 0 1       Home Medications    Prior to Admission medications   Medication Sig Start Date End Date Taking? Authorizing Provider  acetaminophen (TYLENOL) 500 MG tablet Take 1,000 mg by mouth every 6 (six) hours as needed for moderate pain.   Yes [provider]  Multiple Vitamins-Minerals (HAIR SKIN AND NAILS FORMULA) TABS Take 1 tablet by mouth daily.   Yes [provider]  famotidine (PEPCID) 20 MG tablet Take 1 tablet (20 mg total) by mouth 2 (two) times daily. 02/04/17   Mort Smelser, Waylan BogaAlexandra M, PA-C  ibuprofen (ADVIL,MOTRIN) 600 MG tablet Take 1 tablet (600 mg total) by mouth every 6 (six) hours as needed. 02/04/17   Vinson Tietze, Waylan BogaAlexandra M, PA-C  ondansetron (ZOFRAN) 4 MG tablet Take 1 tablet (4 mg total) by mouth every 6 (six) hours. 02/04/17   Quaran Kedzierski, Waylan BogaAlexandra M, PA-C  penicillin v potassium (VEETID) 500 MG tablet Take 1 tablet (500 mg total) by mouth 4 (four) times daily for 7 days. 02/02/17 02/09/17  Maxwell CaulLayden, Lindsey A, PA-C    Family History Family History  Problem Relation Age of  Onset  . Heart attack Mother   . Stomach cancer Mother   . Hypertension Father     Social History Social History   Tobacco Use  . Smoking status: Former Smoker    Packs/day: 0.50    Years: 1.00    Pack years: 0.50    Types: Cigarettes  . Smokeless tobacco: Never Used  . Tobacco comment: quit 2 years ago  Substance Use Topics  . Alcohol use: Yes    Alcohol/week: 0.6 oz    Types: 1 Cans of beer per week    Comment: vodka 2-3 times weekly  . Drug use: No    Comment: Occasional     Allergies   Patient has no known allergies.   Review of Systems Review of Systems  Constitutional: Negative for chills and fever.  HENT: Positive for dental  problem. Negative for facial swelling and sore throat.   Respiratory: Negative for shortness of breath.   Cardiovascular: Negative for chest pain.  Gastrointestinal: Positive for vomiting. Negative for abdominal pain and nausea.  Genitourinary: Negative for dysuria.  Musculoskeletal: Negative for back pain.  Skin: Negative for rash and wound.  Neurological: Negative for headaches.  Psychiatric/Behavioral: The patient is not nervous/anxious.      Physical Exam Updated Vital Signs BP (!) 143/96 (BP Location: Right Arm)   Pulse 60   Temp 98.6 F (37 C) (Oral)   Resp 15   LMP 02/02/2017   SpO2 100%   Physical Exam  Constitutional: She appears well-developed and well-nourished. No distress.  HENT:  Head: Normocephalic and atraumatic.  Mouth/Throat: Oropharynx is clear and moist. No trismus in the jaw. No dental abscesses. No oropharyngeal exudate, posterior oropharyngeal edema, posterior oropharyngeal erythema or tonsillar abscesses.    Eyes: Conjunctivae are normal. Pupils are equal, round, and reactive to light. Right eye exhibits no discharge. Left eye exhibits no discharge. No scleral icterus.  Neck: Normal range of motion. Neck supple. No thyromegaly present.    No submandibular masses, mild tenderness  Cardiovascular: Normal rate, regular rhythm, normal heart sounds and intact distal pulses. Exam reveals no gallop and no friction rub.  No murmur heard. Pulmonary/Chest: Effort normal and breath sounds normal. No stridor. No respiratory distress. She has no wheezes. She has no rales.  Abdominal: Soft. Bowel sounds are normal. She exhibits no distension. There is no tenderness. There is no rebound and no guarding.  Musculoskeletal: She exhibits no edema.  Lymphadenopathy:    She has no cervical adenopathy.  Neurological: She is alert. Coordination normal.  Skin: Skin is warm and dry. No rash noted. She is not diaphoretic. No pallor.  Psychiatric: She has a normal mood and  affect.  Nursing note and vitals reviewed.    ED Treatments / Results  Labs (all labs ordered are listed, but only abnormal results are displayed) Labs Reviewed  COMPREHENSIVE METABOLIC PANEL - Abnormal; Notable for the following components:      Result Value   AST 53 (*)    ALT 55 (*)    All other components within normal limits  CBC - Abnormal; Notable for the following components:   Hemoglobin 11.3 (*)    HCT 34.8 (*)    All other components within normal limits  ACETAMINOPHEN LEVEL - Abnormal; Notable for the following components:   Acetaminophen (Tylenol), Serum <10 (*)    All other components within normal limits  COMPREHENSIVE METABOLIC PANEL - Abnormal; Notable for the following components:   Glucose, Bld 104 (*)  All other components within normal limits  ACETAMINOPHEN LEVEL - Abnormal; Notable for the following components:   Acetaminophen (Tylenol), Serum <10 (*)    All other components within normal limits  POC URINE PREG, ED    EKG  EKG Interpretation None       Radiology No results found.  Procedures Procedures (including critical care time)  Medications Ordered in ED Medications  oxyCODONE-acetaminophen (PERCOCET/ROXICET) 5-325 MG per tablet 1 tablet (1 tablet Oral Given 02/04/17 0615)  ondansetron (ZOFRAN) injection 4 mg (not administered)  ondansetron (ZOFRAN-ODT) disintegrating tablet 8 mg (8 mg Oral Given 02/04/17 0946)  penicillin v potassium (VEETID) tablet 500 mg (500 mg Oral Given 02/04/17 0946)  ketorolac (TORADOL) 30 MG/ML injection 15 mg (15 mg Intramuscular Given 02/04/17 0946)  pantoprazole (PROTONIX) EC tablet 40 mg (40 mg Oral Given 02/04/17 0951)  gi cocktail (Maalox,Lidocaine,Donnatal) (30 mLs Oral Given 02/04/17 0951)  acetylcysteine (MUCOMYST) 20 % nebulizer / oral solution 12,700 mg (12,700 mg Oral Given 02/04/17 1312)     Initial Impression / Assessment and Plan / ED Course  I have reviewed the triage vital signs and the nursing  notes.  Pertinent labs & imaging results that were available during my care of the patient were reviewed by me and considered in my medical decision making (see chart for details).  Clinical Course as of Feb 05 1431  Sun Feb 04, 2017  1215 I spoke with poison control at 1210, who advised repeat LFTs and acetaminophen level at the same time as well as a loading dose of N-acetylcysteine 140 mg/kg.  Poison control will call back to recheck.  If labs are unchanged or improved, patient will be medically cleared.  [AL]  1427 I spoke with Almira Coaster from poison control at 1427 to update that LFTs have returned to normal and acetaminophen is still negative.  They have medically cleared the patient for discharge home.  [AL]    Clinical Course User Index [AL] Emi Holes, PA-C    Patient presenting with ongoing dental pain and 2 episodes of hematemesis.  On further discussion with the patient, was found that she had accidentally overdosed on Tylenol, taking 15 g in the past 48 hours.  Initially, LFTs elevated.  Acetaminophen level added later on my evaluation, which was negative.  Poison control consulted who advised repeat LFTs and acetaminophen level at the same blood draw as well as loading dose of N-acetylcysteine oral.  LFTs return to normal limits and acetaminophen level continues to be negative.  Will discharge patient home, as patient is medically cleared by poison control.  Patient advised to avoid Tylenol and only take ibuprofen as I have prescribed.  I will also give Pepcid to help with gastritis symptoms.  She is encouraged to see a dentist as soon as possible.  Patient also advised to fill her prescription for penicillin.  Her first dose was given in the ED.  Return precautions discussed.  Patient understands and agrees with plan.  Patient vitals stable throughout ED course and discharged in satisfactory condition.  Final Clinical Impressions(s) / ED Diagnoses   Final diagnoses:  Pain, dental    Hematemesis with nausea  Tylenol overdose, accidental or unintentional, initial encounter    ED Discharge Orders        Ordered    ibuprofen (ADVIL,MOTRIN) 600 MG tablet  Every 6 hours PRN     02/04/17 1429    ondansetron (ZOFRAN) 4 MG tablet  Every 6 hours  02/04/17 1429    famotidine (PEPCID) 20 MG tablet  2 times daily     02/04/17 [address], PA-C 02/04/17 1433    Tilden Fossa, MD 02/05/17 2044

## 2017-04-08 ENCOUNTER — Encounter (HOSPITAL_COMMUNITY): Payer: Self-pay | Admitting: Emergency Medicine

## 2017-04-08 ENCOUNTER — Other Ambulatory Visit: Payer: Self-pay

## 2017-04-08 ENCOUNTER — Emergency Department (HOSPITAL_COMMUNITY)
Admission: EM | Admit: 2017-04-08 | Discharge: 2017-04-08 | Disposition: A | Discharge status: Home / Self Care | Payer: Worker's Compensation | Attending: Emergency Medicine | Admitting: Emergency Medicine

## 2017-04-08 ENCOUNTER — Emergency Department (HOSPITAL_COMMUNITY): Payer: Worker's Compensation

## 2017-04-08 DIAGNOSIS — I10 Essential (primary) hypertension: Secondary | ICD-10-CM | POA: Diagnosis not present

## 2017-04-08 DIAGNOSIS — Z87891 Personal history of nicotine dependence: Secondary | ICD-10-CM | POA: Insufficient documentation

## 2017-04-08 DIAGNOSIS — M25572 Pain in left ankle and joints of left foot: Secondary | ICD-10-CM | POA: Insufficient documentation

## 2017-04-08 DIAGNOSIS — S99912A Unspecified injury of left ankle, initial encounter: Secondary | ICD-10-CM

## 2017-04-08 MED ORDER — IBUPROFEN 200 MG PO TABS
600.0000 mg | ORAL_TABLET | Freq: Once | ORAL | Status: AC
Start: 2017-04-08 — End: 2017-04-08
  Administered 2017-04-08: 600 mg via ORAL
  Filled 2017-04-08: qty 1

## 2017-04-08 NOTE — Discharge Instructions (Addendum)
You have been seen today for an ankle injury. There were no acute abnormalities on the x-rays, including no sign of fracture or dislocation, however, there could be injuries to the soft tissues, such as the ligaments or tendons that are not seen on xrays. There could also be what are called occult fractures that are small fractures not seen on xray. °Pain: Take 600 mg of ibuprofen every 6 hours or 440 mg (over the counter dose) to 500 mg (prescription dose) of naproxen every 12 hours for the next 3 days. After this time, these medications may be used as needed for pain. Take these medications with food to avoid upset stomach. Choose only one of these medications, do not take them together.  °Tylenol: Should you continue to have additional pain while taking the ibuprofen or naproxen, you may add in tylenol as needed. Your daily total maximum amount of tylenol from all sources should be limited to 4000mg/day for persons without liver problems, or 2000mg/day for those with liver problems. °Ice: May apply ice to the area over the next 24 hours for 15 minutes at a time to reduce swelling. °Elevation: Keep the extremity elevated as often as possible to reduce pain and inflammation. °Support: Wear the ankle brace for support and comfort. Wear this until pain resolves. You will be weight-bearing as tolerated, which means you can slowly start to put weight on the extremity and increase amount and frequency as pain allows. °Exercises: Start by performing these exercises a few times a week, increasing the frequency until you are performing them twice daily.  °Follow up: If symptoms are improving, you may follow up with your primary care provider for any continued management. If symptoms are not improving, you may follow up with the orthopedic specialist. °

## 2017-04-08 NOTE — Progress Notes (Signed)
Orthopedic Tech Progress Note Patient Details:  [name] CoombesJustina [name] 02/24/1990 [id number]  Ortho Devices Type of Ortho Device: ASO, Crutches Ortho Device/Splint Location: lle Ortho Device/Splint Interventions: Ordered, Application, Adjustment   Post Interventions Patient Tolerated: Well Instructions Provided: Care of device, Adjustment of device   Trinna PostMartinez, Watson Robarge J 04/08/2017, 10:20 PM

## 2017-04-08 NOTE — ED Notes (Addendum)
Pt verbalizes understanding of d/c instructions. Pt taken to lobby in wheelchair with all belongings.

## 2017-04-08 NOTE — ED Provider Notes (Signed)
[facility]ENC[facility] Provider Note   CSN: [id number] Arrival date & time: 04/08/17  1751     History   Chief Complaint Chief Complaint  Patient presents with  . Ankle Pain    HPI [name] [name] is a 27 y.o. female.  HPI   [name] [name] is a 27 y.o. female, with a history of bipolar and hypertension, presenting to the ED with left ankle injury that occurred last night.  Patient states she slipped and "twisted the ankle."  Pain to the left lateral ankle accompanied by swelling. Pain is throbbing, 8/10, nonradiating.  She has tried a single dose of Tylenol without relief.  Denies head injury, neck/back pain, numbness, weakness, other injuries, or any other complaints.    Past Medical History:  Diagnosis Date  . Bipolar 1 disorder (HCC)   . Depression   . Gallstones and inflammation of gallbladder with obstruction 10/19/2015  . Hypertension   . Suicidal overdose [facility](HCC)     Patient Active Problem List   Diagnosis Date Noted  . Cholelithiasis 11/04/2015  . Environmental allergies 05/28/2014  . Depression 04/29/2012    Past Surgical History:  Procedure Laterality Date  . CHOLECYSTECTOMY N/A 11/04/2015   Procedure: LAPAROSCOPIC CHOLECYSTECTOMY WITH INTRAOPERATIVE CHOLANGIOGRAM;  Surgeon: Ovidio Kinavid Newman, MD;  Location: [facility] OR;  Service: General;  Laterality: N/A;     OB History    Gravida  1   Para  1   Term  1   Preterm  0   AB  0   Living  1     SAB  0   TAB  0   Ectopic  0   Multiple  0   Live Births  1            Home Medications    Prior to Admission medications   Medication Sig Start Date End Date Taking? Authorizing Provider  acetaminophen (TYLENOL) 500 MG tablet Take 1,000 mg by mouth every 6 (six) hours as needed for moderate pain.    [provider]  famotidine (PEPCID) 20 MG tablet Take 1 tablet (20 mg total) by mouth 2 (two) times daily. 02/04/17   Law, Waylan BogaAlexandra M, PA-C  ibuprofen (ADVIL,MOTRIN)  600 MG tablet Take 1 tablet (600 mg total) by mouth every 6 (six) hours as needed. 02/04/17   Law, Waylan BogaAlexandra M, PA-C  Multiple Vitamins-Minerals (HAIR SKIN AND NAILS FORMULA) TABS Take 1 tablet by mouth daily.    [provider]  ondansetron (ZOFRAN) 4 MG tablet Take 1 tablet (4 mg total) by mouth every 6 (six) hours. 02/04/17   Emi HolesLaw, Alexandra M, PA-C    Family History Family History  Problem Relation Age of Onset  . Heart attack Mother   . Stomach cancer Mother   . Hypertension Father     Social History Social History   Tobacco Use  . Smoking status: Former Smoker    Packs/day: 0.50    Years: 1.00    Pack years: 0.50    Types: Cigarettes  . Smokeless tobacco: Never Used  . Tobacco comment: quit 2 years ago  Substance Use Topics  . Alcohol use: Yes    Alcohol/week: 0.6 oz    Types: 1 Cans of beer per week    Comment: vodka 2-3 times weekly  . Drug use: Yes    Types: Marijuana    Comment: Occasional     Allergies   Patient has no known allergies.   Review of Systems Review  of Systems  Musculoskeletal: Positive for arthralgias and joint swelling.  Neurological: Negative for weakness and numbness.     Physical Exam Updated Vital Signs BP (!) 142/95 (BP Location: Right Arm)   Pulse 75   Temp 99 F (37.2 C) (Oral)   Resp 18   Ht 5\' 6"  (1.676 m)   Wt 93.4 kg (206 lb)   LMP 03/11/2017   SpO2 100%   BMI 33.25 kg/m   Physical Exam  Constitutional: She appears well-developed and well-nourished. No distress.  HENT:  Head: Normocephalic and atraumatic.  Eyes: Conjunctivae are normal.  Neck: Neck supple.  Cardiovascular: Normal rate, regular rhythm and intact distal pulses.  Pulmonary/Chest: Effort normal.  Musculoskeletal: She exhibits edema and tenderness.  Tenderness and minor edema to the left lateral malleolus.  No noted instability or deformity.  Neurological: She is alert.  Sensation intact in the left foot and toes. Motor function intact in  the cardinal directions of the left ankle as well as the left toes.   Skin: Skin is warm and dry. Capillary refill takes less than 2 seconds. She is not diaphoretic. No pallor.  Psychiatric: She has a normal mood and affect. Her behavior is normal.  Nursing note and vitals reviewed.    ED Treatments / Results  Labs (all labs ordered are listed, but only abnormal results are displayed) Labs Reviewed - No data to display  EKG None  Radiology Dg Ankle Complete Left  Result Date: 04/08/2017 CLINICAL DATA:  Fall with twisting left ankle injury. EXAM: LEFT ANKLE COMPLETE - 3+ VIEW COMPARISON:  None. FINDINGS: There is no evidence of fracture, dislocation, or joint effusion. There is no evidence of arthropathy or other focal bone abnormality. Soft tissues are unremarkable. IMPRESSION: Negative. Electronically Signed   By: Elberta Fortis M.D.   On: 04/08/2017 18:52    Procedures Procedures (including critical care time)  Medications Ordered in ED Medications  ibuprofen (ADVIL,MOTRIN) tablet 600 mg (600 mg Oral Given 04/08/17 2006)     Initial Impression / Assessment and Plan / ED Course  I have reviewed the triage vital signs and the nursing notes.  Pertinent labs & imaging results that were available during my care of the patient were reviewed by me and considered in my medical decision making (see chart for details).      Patient presents with a left ankle injury.  Neurovascularly intact.  No acute osseous abnormality on x-ray.  Orthopedic versus PCP follow-up. The patient was given instructions for home care as well as return precautions. Patient voices understanding of these instructions, accepts the plan, and is comfortable with discharge.    Final Clinical Impressions(s) / ED Diagnoses   Final diagnoses:  Injury of left ankle, initial encounter    ED Discharge Orders    None       Concepcion Living 04/08/17 2027    Alvira Monday, MD 04/09/17 1358

## 2017-04-08 NOTE — ED Triage Notes (Signed)
Pt slipped on paper towels in floor at work yesterday.  C/o L ankle pain.

## 2017-07-14 ENCOUNTER — Emergency Department (HOSPITAL_COMMUNITY): Payer: Self-pay

## 2017-07-14 ENCOUNTER — Emergency Department (HOSPITAL_COMMUNITY)
Admission: EM | Admit: 2017-07-14 | Discharge: 2017-07-14 | Disposition: A | Discharge status: Home / Self Care | Payer: Self-pay | Attending: Emergency Medicine | Admitting: Emergency Medicine

## 2017-07-14 ENCOUNTER — Other Ambulatory Visit: Payer: Self-pay

## 2017-07-14 ENCOUNTER — Encounter (HOSPITAL_COMMUNITY): Payer: Self-pay | Admitting: Emergency Medicine

## 2017-07-14 DIAGNOSIS — N83201 Unspecified ovarian cyst, right side: Secondary | ICD-10-CM

## 2017-07-14 DIAGNOSIS — I10 Essential (primary) hypertension: Secondary | ICD-10-CM | POA: Insufficient documentation

## 2017-07-14 DIAGNOSIS — R109 Unspecified abdominal pain: Secondary | ICD-10-CM | POA: Insufficient documentation

## 2017-07-14 DIAGNOSIS — N83291 Other ovarian cyst, right side: Secondary | ICD-10-CM | POA: Insufficient documentation

## 2017-07-14 DIAGNOSIS — K089 Disorder of teeth and supporting structures, unspecified: Secondary | ICD-10-CM | POA: Insufficient documentation

## 2017-07-14 DIAGNOSIS — Z79899 Other long term (current) drug therapy: Secondary | ICD-10-CM | POA: Insufficient documentation

## 2017-07-14 DIAGNOSIS — Z87891 Personal history of nicotine dependence: Secondary | ICD-10-CM | POA: Insufficient documentation

## 2017-07-14 DIAGNOSIS — K0889 Other specified disorders of teeth and supporting structures: Secondary | ICD-10-CM

## 2017-07-14 LAB — COMPREHENSIVE METABOLIC PANEL WITH GFR
ALT: 32 U/L (ref 0–44)
AST: 21 U/L (ref 15–41)
Albumin: 3.6 g/dL (ref 3.5–5.0)
Alkaline Phosphatase: 67 U/L (ref 38–126)
Anion gap: 11 (ref 5–15)
BUN: 15 mg/dL (ref 6–20)
CO2: 22 mmol/L (ref 22–32)
Calcium: 9.2 mg/dL (ref 8.9–10.3)
Chloride: 103 mmol/L (ref 98–111)
Creatinine, Ser: 0.84 mg/dL (ref 0.44–1.00)
GFR calc Af Amer: >60 mL/min
GFR calc non Af Amer: >60 mL/min
Glucose, Bld: 90 mg/dL (ref 70–99)
Potassium: 3.9 mmol/L (ref 3.5–5.1)
Sodium: 136 mmol/L (ref 135–145)
Total Bilirubin: 0.4 mg/dL (ref 0.3–1.2)
Total Protein: 8 g/dL (ref 6.5–8.1)

## 2017-07-14 LAB — GROUP A STREP BY PCR: Group A Strep by PCR: NOT DETECTED

## 2017-07-14 LAB — URINALYSIS, ROUTINE W REFLEX MICROSCOPIC
Bacteria, UA: NONE SEEN
Bilirubin Urine: NEGATIVE
Glucose, UA: NEGATIVE mg/dL
Ketones, ur: NEGATIVE mg/dL
Leukocytes, UA: NEGATIVE
Nitrite: NEGATIVE
Protein, ur: NEGATIVE mg/dL
Specific Gravity, Urine: 1.025 (ref 1.005–1.030)
pH: 5 (ref 5.0–8.0)

## 2017-07-14 LAB — CBC
HCT: 39.3 % (ref 36.0–46.0)
Hemoglobin: 12.2 g/dL (ref 12.0–15.0)
MCH: 28.4 pg (ref 26.0–34.0)
MCHC: 31 g/dL (ref 30.0–36.0)
MCV: 91.4 fL (ref 78.0–100.0)
Platelets: 275 K/uL (ref 150–400)
RBC: 4.3 MIL/uL (ref 3.87–5.11)
RDW: 12.2 % (ref 11.5–15.5)
WBC: 12.4 K/uL — ABNORMAL HIGH (ref 4.0–10.5)

## 2017-07-14 LAB — I-STAT BETA HCG BLOOD, ED (MC, WL, AP ONLY): I-stat hCG, quantitative: <5 m[IU]/mL

## 2017-07-14 LAB — LIPASE, BLOOD: Lipase: 27 U/L (ref 11–51)

## 2017-07-14 MED ORDER — HYDROMORPHONE HCL 1 MG/ML IJ SOLN
1.0000 mg | Freq: Once | INTRAMUSCULAR | Status: AC
  Administered 2017-07-14: 1 mg via INTRAVENOUS
  Filled 2017-07-14 (×2): qty 1

## 2017-07-14 MED ORDER — AMOXICILLIN-POT CLAVULANATE 875-125 MG PO TABS: 1.0000 | ORAL_TABLET | Freq: Two times a day (BID) | ORAL | 0 refills | Status: DC

## 2017-07-14 MED ORDER — IBUPROFEN 800 MG PO TABS: 800.0000 mg | ORAL_TABLET | Freq: Three times a day (TID) | ORAL | 0 refills | Status: DC

## 2017-07-14 MED ORDER — IOHEXOL 300 MG/ML  SOLN
100.0000 mL | Freq: Once | INTRAMUSCULAR | Status: AC | PRN
  Administered 2017-07-14: 100 mL via INTRAVENOUS

## 2017-07-14 MED ORDER — LIDOCAINE VISCOUS HCL 2 % MT SOLN: 15.0000 mL | OROMUCOSAL | 0 refills | Status: DC | PRN

## 2017-07-14 MED ORDER — MORPHINE SULFATE (PF) 4 MG/ML IV SOLN
4.0000 mg | Freq: Once | INTRAVENOUS | Status: AC
  Administered 2017-07-14: 4 mg via INTRAVENOUS
  Filled 2017-07-14: qty 1

## 2017-07-14 MED ORDER — ONDANSETRON 4 MG PO TBDP: 4.0000 mg | ORAL_TABLET | Freq: Three times a day (TID) | ORAL | 0 refills | Status: DC | PRN

## 2017-07-14 MED ORDER — ONDANSETRON HCL 4 MG/2ML IJ SOLN
4.0000 mg | Freq: Once | INTRAMUSCULAR | Status: AC
  Administered 2017-07-14: 4 mg via INTRAVENOUS
  Filled 2017-07-14: qty 2

## 2017-07-14 NOTE — Discharge Instructions (Signed)
Thank you for allowing me to provide your care today in the emergency department.  I have attached several resources for you to use to follow-up.  [facility] can serve as your primary care.  You can call them to schedule a follow-up appointment.  Call the [facility] to get established with an OB/GYN regarding your ovarian cyst.  Dental resource guide is attached sometimes you need to call all of the dentist on the list.   Take 1 tablet of ibuprofen with food every 8 hours as needed for pain control.  Take 15 mL of viscous lidocaine every 3 hours to help with your dental pain.  Let 1 tablet of Zofran dissolve under your tongue every 8 hours as needed for nausea or vomiting.  Return to the emergency department if you develop a high fever that does not improve by taking Tylenol or ibuprofen, if you become unable to open your mouth, develop drooling, persistent vomiting, or other new concerning symptoms.

## 2017-07-14 NOTE — ED Triage Notes (Signed)
Pt reports "liver pain" since yesterday, hx liver failure. Reports dental pain since yesterday as well. Reports nausea and headaches

## 2017-07-14 NOTE — ED Provider Notes (Signed)
[facility] Provider Note   CSN: [id number] Arrival date & time: 07/14/17  0620     History   Chief Complaint Chief Complaint  Patient presents with  . Dental Pain  . Abdominal Pain    HPI [name] CoombesJustina [name] is a 27 y.o. female with a history of a tonsillar abscess, strep throat, and cholelithiasis who presents to the emergency department with multiple complaints.  LMP 07/11/2017.  She is currently on her menstrual cycle.  The history is provided by the patient. No language interpreter was used.  Dental Pain   This is a recurrent problem. The current episode started yesterday. The problem occurs constantly. The problem has been gradually worsening. The pain is at a severity of 10/10. The pain is severe. She has tried acetaminophen for the symptoms. The treatment provided no relief.  Abdominal Pain   This is a new problem. The current episode started yesterday. The problem occurs constantly. The problem has been gradually worsening. The pain is associated with an unknown factor. The pain is located in the RLQ. The pain is at a severity of 10/10. The pain is severe. Associated symptoms include nausea. Pertinent negatives include fever, diarrhea, flatus, hematochezia, melena, vomiting, constipation, dysuria, frequency, hematuria, headaches and myalgias. Nothing aggravates the symptoms. Nothing relieves the symptoms. Past workup does not include GI consult, CT scan, ultrasound or surgery. Her past medical history is significant for gallstones.    Past Medical History:  Diagnosis Date  . Bipolar 1 disorder (HCC)   . Depression   . Gallstones and inflammation of gallbladder with obstruction 10/19/2015  . Hypertension   . Suicidal overdose [facility](HCC)     Patient Active Problem List   Diagnosis Date Noted  . Cholelithiasis 11/04/2015  . Environmental allergies 05/28/2014  . Depression 04/29/2012    Past Surgical History:  Procedure Laterality Date    . CHOLECYSTECTOMY N/A 11/04/2015   Procedure: LAPAROSCOPIC CHOLECYSTECTOMY WITH INTRAOPERATIVE CHOLANGIOGRAM;  Surgeon: Ovidio Kinavid Newman, MD;  Location: [facility] OR;  Service: General;  Laterality: N/A;     OB History    Gravida  1   Para  1   Term  1   Preterm  0   AB  0   Living  1     SAB  0   TAB  0   Ectopic  0   Multiple  0   Live Births  1            Home Medications    Prior to Admission medications   Medication Sig Start Date End Date Taking? Authorizing Provider  acetaminophen (TYLENOL) 500 MG tablet Take 1,000 mg by mouth every 6 (six) hours as needed for moderate pain.    [provider]  amoxicillin-clavulanate (AUGMENTIN) 875-125 MG tablet Take 1 tablet by mouth every 12 (twelve) hours. 07/14/17   Mike Hamre A, PA-C  famotidine (PEPCID) 20 MG tablet Take 1 tablet (20 mg total) by mouth 2 (two) times daily. 02/04/17   Law, Waylan BogaAlexandra M, PA-C  ibuprofen (ADVIL,MOTRIN) 800 MG tablet Take 1 tablet (800 mg total) by mouth 3 (three) times daily. 07/14/17   Deontez Klinke A, PA-C  lidocaine (XYLOCAINE) 2 % solution Use as directed 15 mLs in the mouth or throat every 3 (three) hours as needed for mouth pain. 07/14/17   Telford Archambeau A, PA-C  Multiple Vitamins-Minerals (HAIR SKIN AND NAILS FORMULA) TABS Take 1 tablet by mouth daily.    [provider]  ondansetron (  ZOFRAN ODT) 4 MG disintegrating tablet Take 1 tablet (4 mg total) by mouth every 8 (eight) hours as needed for nausea or vomiting. 07/14/17   Jyasia Markoff A, PA-C  ondansetron (ZOFRAN) 4 MG tablet Take 1 tablet (4 mg total) by mouth every 6 (six) hours. 02/04/17   Emi Holes, PA-C    Family History Family History  Problem Relation Age of Onset  . Heart attack Mother   . Stomach cancer Mother   . Hypertension Father     Social History Social History   Tobacco Use  . Smoking status: Former Smoker    Packs/day: 0.50    Years: 1.00    Pack years: 0.50    Types: Cigarettes  .  Smokeless tobacco: Never Used  . Tobacco comment: quit 2 years ago  Substance Use Topics  . Alcohol use: Yes    Alcohol/week: 0.6 oz    Types: 1 Cans of beer per week    Comment: vodka 2-3 times weekly  . Drug use: Yes    Types: Marijuana    Comment: Occasional     Allergies   Patient has no known allergies.   Review of Systems Review of Systems  Constitutional: Negative for chills and fever.  HENT: Positive for dental problem, facial swelling, sore throat and trouble swallowing. Negative for sinus pressure and sinus pain.   Respiratory: Positive for shortness of breath. Negative for cough and wheezing.   Cardiovascular: Negative for chest pain, palpitations and leg swelling.  Gastrointestinal: Positive for abdominal pain and nausea. Negative for constipation, diarrhea, flatus, hematochezia, melena, rectal pain and vomiting.  Genitourinary: Positive for vaginal bleeding. Negative for dysuria, frequency, hematuria, pelvic pain, urgency, vaginal discharge and vaginal pain.  Musculoskeletal: Negative for myalgias.  Skin: Negative for color change, pallor and wound.  Neurological: Negative for weakness, numbness and headaches.     Physical Exam Updated Vital Signs BP (!) 138/101 (BP Location: Right Arm)   Pulse 88   Temp 99.4 F (37.4 C) (Oral)   Resp 16   Ht 5\' 6"  (1.676 m)   Wt 94.8 kg (209 lb)   LMP  (LMP Unknown)   SpO2 100%   BMI 33.73 kg/m   Physical Exam  Constitutional: No distress.  HENT:  Head: Normocephalic.  Right Ear: Hearing, tympanic membrane and ear canal normal.  Left Ear: Hearing, tympanic membrane and ear canal normal.  Nose: Nose normal. Right sinus exhibits no maxillary sinus tenderness and no frontal sinus tenderness. Left sinus exhibits no maxillary sinus tenderness and no frontal sinus tenderness.  Mouth/Throat: Uvula is midline and mucous membranes are normal. No oral lesions. No trismus in the jaw. No uvula swelling. Posterior oropharyngeal  erythema present. No oropharyngeal exudate, posterior oropharyngeal edema or tonsillar abscesses. No tonsillar exudate.  Poor dentition throughout.  She is tender to palpation to the first right mandibular molar.  No overlying erythema or edema to the surrounding gingiva.  No gross abscess.  No submandibular tenderness or induration.  Shotty anterior lymphadenopathy bilaterally.  She is significantly tender to palpation to the right anterolateral neck.  Left anterolateral neck is nontender.  There is mild swelling to the right cheek and jaw with no swelling on the left.  Voice sounds somewhat muffled.  No trismus or drooling.  Eyes: Pupils are equal, round, and reactive to light. Conjunctivae and EOM are normal.  Neck: Neck supple.  Cardiovascular: Normal rate, regular rhythm, normal heart sounds and intact distal pulses. Exam reveals no gallop  and no friction rub.  No murmur heard. Pulmonary/Chest: Effort normal. No stridor. No respiratory distress. She has no wheezes. She has no rales. She exhibits no tenderness.  Abdominal: Soft. Bowel sounds are normal. She exhibits no distension and no mass. There is no hepatosplenomegaly, splenomegaly or hepatomegaly. There is tenderness in the right lower quadrant. There is tenderness at McBurney's point. There is no rigidity, no rebound, no guarding, no CVA tenderness and negative Murphy's sign. No hernia.  Tender to palpation in the right lower quadrant.  She is tender over McBurney's point.  No right upper quadrant tenderness or left-sided tenderness.  No rebound or guarding.  No CVA tenderness bilaterally.  No peritoneal signs.  Musculoskeletal: She exhibits no edema, tenderness or deformity.  Neurological: She is alert.  Skin: Skin is warm. Capillary refill takes less than 2 seconds. No rash noted. She is not diaphoretic.  Psychiatric: Her behavior is normal.  Nursing note and vitals reviewed.    ED Treatments / Results  Labs (all labs ordered are  listed, but only abnormal results are displayed) Labs Reviewed  CBC - Abnormal; Notable for the following components:      Result Value   WBC 12.4 (*)    All other components within normal limits  URINALYSIS, ROUTINE W REFLEX MICROSCOPIC - Abnormal; Notable for the following components:   Hgb urine dipstick MODERATE (*)    All other components within normal limits  GROUP A STREP BY PCR  LIPASE, BLOOD  COMPREHENSIVE METABOLIC PANEL  I-STAT BETA HCG BLOOD, ED ([facility], [facility], [facility] ONLY)    EKG None  Radiology Ct Soft Tissue Neck W Contrast  Result Date: 07/14/2017 CLINICAL DATA:  Sore throat/stridor. Epiglottitis or tonsillitis suspected EXAM: CT NECK WITH CONTRAST TECHNIQUE: Multidetector CT imaging of the neck was performed using the standard protocol following the bolus administration of intravenous contrast. CONTRAST:  OMNIPAQUE IOHEXOL 300 MG/ML  SOLN COMPARISON:  06/04/2015 FINDINGS: Pharynx and larynx: Prominent mucosal enhancement and thickening of the tonsillar tissue, especially the adenoid. No laryngeal edema or narrowing is seen. Mild retropharyngeal edema without abscess. Salivary glands: Negative Thyroid: Negative Lymph nodes: Homogeneous nodal enlargement in the upper jugular chains and lateral retropharyngeal stations. No cavitation. Vascular: Major vessels are patent Limited intracranial: Negative Visualized orbits: Negative Mastoids and visualized paranasal sinuses: Clear Skeleton: No acute or aggressive finding. Dental cavities with small right molar periapical erosion and condensing osteitis. Upper chest: Negative IMPRESSION: 1. Tonsillitis, non suppurative adenitis, and mild retropharyngeal edema. No abscess or laryngeal edema. 2. Dental caries with tooth 31 periapical erosion. Electronically Signed   By: Marnee Spring M.D.   On: 07/14/2017 10:54   US Transvaginal Non-ob  Result Date: 07/14/2017 CLINICAL DATA:  Right-sided ovarian cystic mass seen on a CT scan from July 14, 2017. Bilateral pelvic pain. EXAM: TRANSABDOMINAL AND TRANSVAGINAL ULTRASOUND OF PELVIS TECHNIQUE: Both transabdominal and transvaginal ultrasound examinations of the pelvis were performed. Transabdominal technique was performed for global imaging of the pelvis including uterus, ovaries, adnexal regions, and pelvic cul-de-sac. It was necessary to proceed with endovaginal exam following the transabdominal exam to visualize the ovaries and endometrium. COMPARISON:  CT scan July 14, 2017 FINDINGS: Uterus Measurements: 8 x 4.5 x 5.6 cm. No fibroids or other mass visualized. Endometrium Thickness: 9 mm.  No focal abnormality visualized. Right ovary Measurements: 7.4 x 5.4 x 8.0 cm. There is a 6.7 x 4.8 x 7.2 cm simple cystic mass in the right ovary, not described on the pelvic  ultrasound from November 20, 2016. Small follicles in the periphery of ovarian tissue surrounding the dominant cystic mass. Left ovary Measurements: 3.3 x 1.9 x 2.8 cm. Normal appearance/no adnexal mass. Other findings Nabothian cysts in the cervix. IMPRESSION: 1. There is a 6.7 x 4.8 x 7.2 cm simple cystic mass in the right ovary which is new since November 2018 when comparing to the pelvic ultrasound report from that time. Images from that study are not available. Since these may be difficult to assess completely with [place], further evaluation of simple-appearing cysts >7 cm with MRI or surgical evaluation is recommended according to the Society of Radiologists in Ultrasound 2010 Consensus Conference Statement (D Lenis Noon et al. Management of Asymptomatic Ovarian and other Adnexal Cysts Imaged at [place]: Society of Radiologists in Ultrasound Consensus Conference Statement 2010. Radiology 256 (Sept 2010): 943-954.). 2. No other abnormalities. Electronically Signed   By: Gerome Sam III M.D   On: 07/14/2017 13:08   US Pelvis Complete  Result Date: 07/14/2017 CLINICAL DATA:  Right-sided ovarian cystic mass seen on a CT scan from July 14, 2017.  Bilateral pelvic pain. EXAM: TRANSABDOMINAL AND TRANSVAGINAL ULTRASOUND OF PELVIS TECHNIQUE: Both transabdominal and transvaginal ultrasound examinations of the pelvis were performed. Transabdominal technique was performed for global imaging of the pelvis including uterus, ovaries, adnexal regions, and pelvic cul-de-sac. It was necessary to proceed with endovaginal exam following the transabdominal exam to visualize the ovaries and endometrium. COMPARISON:  CT scan July 14, 2017 FINDINGS: Uterus Measurements: 8 x 4.5 x 5.6 cm. No fibroids or other mass visualized. Endometrium Thickness: 9 mm.  No focal abnormality visualized. Right ovary Measurements: 7.4 x 5.4 x 8.0 cm. There is a 6.7 x 4.8 x 7.2 cm simple cystic mass in the right ovary, not described on the pelvic ultrasound from November 20, 2016. Small follicles in the periphery of ovarian tissue surrounding the dominant cystic mass. Left ovary Measurements: 3.3 x 1.9 x 2.8 cm. Normal appearance/no adnexal mass. Other findings Nabothian cysts in the cervix. IMPRESSION: 1. There is a 6.7 x 4.8 x 7.2 cm simple cystic mass in the right ovary which is new since November 2018 when comparing to the pelvic ultrasound report from that time. Images from that study are not available. Since these may be difficult to assess completely with [place], further evaluation of simple-appearing cysts >7 cm with MRI or surgical evaluation is recommended according to the Society of Radiologists in Ultrasound 2010 Consensus Conference Statement (D Lenis Noon et al. Management of Asymptomatic Ovarian and other Adnexal Cysts Imaged at [place]: Society of Radiologists in Ultrasound Consensus Conference Statement 2010. Radiology 256 (Sept 2010): 943-954.). 2. No other abnormalities. Electronically Signed   By: Gerome Sam III M.D   On: 07/14/2017 13:08   Ct Abdomen Pelvis W Contrast  Result Date: 07/14/2017 CLINICAL DATA:  Abdominal pain and tenderness with nausea and vomiting since yesterday.  EXAM: CT ABDOMEN AND PELVIS WITH CONTRAST TECHNIQUE: Multidetector CT imaging of the abdomen and pelvis was performed using the standard protocol following bolus administration of intravenous contrast. CONTRAST:  OMNIPAQUE IOHEXOL 300 MG/ML  SOLN COMPARISON:  Right upper quadrant abdominal ultrasound dated 10/19/2015. Abdomen ultrasound dated 12/07/2012 and pelvic ultrasound dated 05/24/2012. FINDINGS: Lower chest: Unremarkable. Hepatobiliary: No focal liver abnormality is seen. Status post cholecystectomy. No biliary dilatation. Pancreas: Unremarkable. No pancreatic ductal dilatation or surrounding inflammatory changes. Spleen: Normal in size without focal abnormality. Adrenals/Urinary Tract: Adrenal glands are unremarkable. Kidneys are normal, without renal calculi, focal lesion, or  hydronephrosis. Bladder is unremarkable. Stomach/Bowel: Stomach is within normal limits. Appendix appears normal. No evidence of bowel wall thickening, distention, or inflammatory changes. Vascular/Lymphatic: No significant vascular findings are present. No enlarged abdominal or pelvic lymph nodes. Reproductive: 7.8 cm right ovarian cyst containing fluid with minimally increased density compared to the urinary bladder. No solid components are visualized. Unremarkable left ovary. At least 1 small uterine fibroid. Other: Small umbilical hernia containing fat. Musculoskeletal: Mild lower thoracic spine degenerative changes. IMPRESSION: 1. 7.8 cm right ovarian cyst containing fluid with minimally increased density compared to the urinary bladder. This may represent a mildly hemorrhagic cyst. This could be better defined with pelvic ultrasound. 2. Small uterine fibroid. Electronically Signed   By: Beckie Salts M.D.   On: 07/14/2017 11:07    Procedures Procedures (including critical care time)  Medications Ordered in ED Medications  ondansetron (ZOFRAN) injection 4 mg (4 mg Intravenous Given 07/14/17 1005)  morphine 4 MG/ML  injection 4 mg (4 mg Intravenous Given 07/14/17 1005)  iohexol (OMNIPAQUE) 300 MG/ML solution 100 mL (100 mLs Intravenous Contrast Given 07/14/17 1022)  HYDROmorphone (DILAUDID) injection 1 mg (1 mg Intravenous Given 07/14/17 1335)     Initial Impression / Assessment and Plan / ED Course  I have reviewed the triage vital signs and the nursing notes.  Pertinent labs & imaging results that were available during my care of the patient were reviewed by me and considered in my medical decision making (see chart for details).     27 year old female with a history of tonsillar abscess, strep throat, and cholelithiasis presenting with right-sided dental pain, sore throat, facial swelling.  Voice is minimally muffled.  She is moderately tender to palpation on the right anterolateral neck with no tenderness on the left.  She reports intermittently that she feels as if her throat is closing.  Given history of poor dentition and previous abscesses, will order CT soft tissue neck.  She also endorses nausea and right lower quadrant abdominal pain that began yesterday.  She reports that her appetite has been poor since onset of symptoms.  On exam, she has focal right lower quadrant tenderness with tenderness over McBurney's point.  This is concerning for appendicitis.  CT abdomen pelvis ordered.  CT abdomen pelvis with a 7.8 right ovarian cyst containing fluid with concern for a mildly hemorrhagic cyst that can be to better defined on ultrasound and a small uterine fibroid.  Pelvic ultrasound with 6.7 x 4.8 x 7.2 simple cystic mass new since November 2018.  Radiology recommends simple cyst greater than 7 cm with follow-up with MRI or surgical evaluation.  The patient is not currently established with an OB/GYN and has been given a referral today.  Pain controlled in the ED.  Will recommend NSAIDs for pain control outpatient and give her an Rx for antiemetics.  CT soft tissue neck is negative for abscess, but  demonstrates periapical erosion on 231, tonsillitis, and mild retropharyngeal edema.  She is able to tolerate fluids successfully in the ED.  Will provide the patient with a dental resource guides that she is not currently established with a dentist and give the patient a course of antibiotics.  Will provide the patient with a course of Augmentin given cost versus clindamycin.  Urged the patient to follow-up with a dentist.  Doubt Ludwig's angina, PTA, or retropharyngeal abscess.  Strict return precautions given.  The patient is hemodynamically stable and in no acute distress.  She is safe for discharge home with  outpatient follow-up at this time.  Final Clinical Impressions(s) / ED Diagnoses   Final diagnoses:  Dentalgia  Right ovarian cyst    ED Discharge Orders        Ordered    ibuprofen (ADVIL,MOTRIN) 800 MG tablet  3 times daily     07/14/17 1442    amoxicillin-clavulanate (AUGMENTIN) 875-125 MG tablet  Every 12 hours     07/14/17 1442    lidocaine (XYLOCAINE) 2 % solution  Every  3 hours PRN     07/14/17 1442    ondansetron (ZOFRAN ODT) 4 MG disintegrating tablet  Every 8 hours PRN     07/14/17 1442       Blinda Turek A, PA-C 07/14/17 1754    Shaune Pollack, MD 07/15/17 1542

## 2017-08-20 ENCOUNTER — Other Ambulatory Visit (HOSPITAL_COMMUNITY)
Admission: RE | Admit: 2017-08-20 | Discharge: 2017-08-20 | Disposition: A | Discharge status: Home / Self Care | Payer: Medicaid Other | Source: Ambulatory Visit | Attending: Obstetrics and Gynecology | Admitting: Obstetrics and Gynecology

## 2017-08-20 ENCOUNTER — Encounter: Payer: Self-pay | Admitting: Obstetrics and Gynecology

## 2017-08-20 ENCOUNTER — Ambulatory Visit (INDEPENDENT_AMBULATORY_CARE_PROVIDER_SITE_OTHER): Payer: Medicaid Other | Admitting: Obstetrics and Gynecology

## 2017-08-20 VITALS — BP 132/101 | HR 76 | Ht 66.0 in | Wt 212.0 lb

## 2017-08-20 DIAGNOSIS — Z01419 Encounter for gynecological examination (general) (routine) without abnormal findings: Secondary | ICD-10-CM | POA: Diagnosis not present

## 2017-08-20 DIAGNOSIS — Z87891 Personal history of nicotine dependence: Secondary | ICD-10-CM | POA: Insufficient documentation

## 2017-08-20 DIAGNOSIS — Z0000 Encounter for general adult medical examination without abnormal findings: Secondary | ICD-10-CM

## 2017-08-20 DIAGNOSIS — Z8041 Family history of malignant neoplasm of ovary: Secondary | ICD-10-CM

## 2017-08-20 DIAGNOSIS — N83201 Unspecified ovarian cyst, right side: Secondary | ICD-10-CM | POA: Diagnosis not present

## 2017-08-20 DIAGNOSIS — N63 Unspecified lump in unspecified breast: Secondary | ICD-10-CM

## 2017-08-20 DIAGNOSIS — Z113 Encounter for screening for infections with a predominantly sexual mode of transmission: Secondary | ICD-10-CM

## 2017-08-20 DIAGNOSIS — Z79899 Other long term (current) drug therapy: Secondary | ICD-10-CM | POA: Insufficient documentation

## 2017-08-20 DIAGNOSIS — N6312 Unspecified lump in the right breast, upper inner quadrant: Secondary | ICD-10-CM | POA: Insufficient documentation

## 2017-08-20 NOTE — Progress Notes (Signed)
GYNECOLOGY ANNUAL PREVENTATIVE CARE ENCOUNTER NOTE  Subjective:   [name] [name] is a 27 y.o. G51P1001 female here for a annual gynecologic exam. Current complaints: abdominal pain.    Reports she has had sharp, stabbing pain in right side, "sometimes upper and sometimes lower." Also reports cramping on the right side " in the back." Was having pain in RUQ after taking tylenol for tooth pain and they told her she had a cyst. Also reports period cramps, reports it is much worse than it used to be. Reports right lower quadrant throughout the month. Has had RLQ pain for about a year but was ignoring it. Has had irregular periods for a while, but recently they are more regular. Periods are more regular but are also lighter than in the past.   Desires STI testing. H/o chlamydia during a pregnancy which was treated. H/o cold sore several weeks ago treated with OTC meds and it improved, would like to be tested for HSV.    Gynecologic History No LMP recorded. Contraception: none Last Pap: 02/2014. Results were: negative Gardisil: does not know if she has received  Obstetric History OB History  Gravida Para Term Preterm AB Living  1 1 1  0 0 1  SAB TAB Ectopic Multiple Live Births  0 0 0 0 1    # Outcome Date GA Lbr Len/2nd Weight Sex Delivery Anes PTL Lv  1 Term 08/07/14 [redacted]w[redacted]d 02:11 / 00:08 6 lb 5.8 oz (2.885 kg) F Vag-Spont EPI  LIV     Birth Comments: NONE    Past Medical History:  Diagnosis Date  . Bipolar 1 disorder (HCC)   . Depression   . Gallstones and inflammation of gallbladder with obstruction 10/19/2015  . Hypertension   . Suicidal overdose [facility])     Past Surgical History:  Procedure Laterality Date  . CHOLECYSTECTOMY N/A 11/04/2015   Procedure: LAPAROSCOPIC CHOLECYSTECTOMY WITH INTRAOPERATIVE CHOLANGIOGRAM;  Surgeon: Ovidio Kin, MD;  Location[facility] OR;  Service: General;  Laterality: N/A;    Current Outpatient Medications on File Prior to Visit  Medication Sig  Dispense Refill  . acetaminophen (TYLENOL) 500 MG tablet Take 1,000 mg by mouth every 6 (six) hours as needed for moderate pain.    Marland Kitchen amoxicillin-clavulanate (AUGMENTIN) 875-125 MG tablet Take 1 tablet by mouth every 12 (twelve) hours. 14 tablet 0  . ibuprofen (ADVIL,MOTRIN) 800 MG tablet Take 1 tablet (800 mg total) by mouth 3 (three) times daily. 21 tablet 0  . Multiple Vitamins-Minerals (HAIR SKIN AND NAILS FORMULA) TABS Take 1 tablet by mouth daily.    . ondansetron (ZOFRAN ODT) 4 MG disintegrating tablet Take 1 tablet (4 mg total) by mouth every 8 (eight) hours as needed for nausea or vomiting. 20 tablet 0  . ondansetron (ZOFRAN) 4 MG tablet Take 1 tablet (4 mg total) by mouth every 6 (six) hours. 12 tablet 0  . famotidine (PEPCID) 20 MG tablet Take 1 tablet (20 mg total) by mouth 2 (two) times daily. (Patient not taking: Reported on 08/20/2017) 30 tablet 0   No current facility-administered medications on file prior to visit.     No Known Allergies  Social History   Socioeconomic History  . Marital status: Single    Spouse name: Not on file  . Number of children: 0  . Years of education: Not on file  . Highest education level: Not on file  Occupational History  . Not on file  Social Needs  . Financial resource strain: Not  on file  . Food insecurity:    Worry: Not on file    Inability: Not on file  . Transportation needs:    Medical: Not on file    Non-medical: Not on file  Tobacco Use  . Smoking status: Former Smoker    Packs/day: 0.50    Years: 1.00    Pack years: 0.50    Types: Cigarettes  . Smokeless tobacco: Never Used  . Tobacco comment: quit 2 years ago  Substance and Sexual Activity  . Alcohol use: Yes    Alcohol/week: 0.6 oz    Types: 1 Cans of beer per week    Comment: vodka 2-3 times weekly  . Drug use: Yes    Types: Marijuana    Comment: Occasional  . Sexual activity: Yes    Partners: Female    Birth control/protection: None    Comment: with same sex    Lifestyle  . Physical activity:    Days per week: Not on file    Minutes per session: Not on file  . Stress: Not on file  Relationships  . Social connections:    Talks on phone: Not on file    Gets together: Not on file    Attends religious service: Not on file    Active member of club or organization: Not on file    Attends meetings of clubs or organizations: Not on file    Relationship status: Not on file  . Intimate partner violence:    Fear of current or ex partner: Not on file    Emotionally abused: Not on file    Physically abused: Not on file    Forced sexual activity: Not on file  Other Topics Concern  . Not on file  Social History Narrative  . Not on file    Family History  Problem Relation Age of Onset  . Heart attack Mother   . Stomach cancer Mother   . Hypertension Father    The following portions of the patient's history were reviewed and updated as appropriate: allergies, current medications, past family history, past medical history, past social history, past surgical history and problem list.  Review of Systems Pertinent items are noted in HPI.   Objective:  BP (!) 132/101 (BP Location: Right Arm)   Pulse 76   Ht 5\' 6"  (1.676 m)   Wt 212 lb (96.2 kg)   BMI 34.22 kg/m  CONSTITUTIONAL: Well-developed, well-nourished female in no acute distress.  HENT:  Normocephalic, atraumatic, External right and left ear normal. Oropharynx is clear and moist EYES: Conjunctivae and EOM are normal. Pupils are equal, round, and reactive to light. No scleral icterus.  NECK: Normal range of motion, supple, no masses.  Normal thyroid.  SKIN: Skin is warm and dry. No rash noted. Not diaphoretic. No erythema. No pallor. NEUROLOGIC: Alert and oriented to person, place, and time. Normal reflexes, muscle tone coordination. No cranial nerve deficit noted. PSYCHIATRIC: Normal mood and affect. Normal behavior. Normal judgment and thought content. CARDIOVASCULAR: Normal heart rate  noted, regular rhythm RESPIRATORY: Clear to auscultation bilaterally. Effort and breath sounds normal, no problems with respiration noted. BREASTS: Symmetric in size. No masses, skin changes, nipple drainage, or lymphadenopathy. Right breast mass 1.5 cm diameter at 2 o'clock on right breast approx 2 cm from areola, tender to touch ABDOMEN: Soft, normal bowel sounds, no distention noted.  No tenderness, rebound or guarding.  PELVIC: Normal appearing external genitalia; normal appearing vaginal mucosa and cervix.  No abnormal  discharge noted.  Pap smear obtained.  Normal uterine size, no palpable masses, mild uterine discomfort with exam, right adnexal tenderness MUSCULOSKELETAL: Normal range of motion. No tenderness.  No cyanosis, clubbing, or edema.  2+ distal pulses.   Assessment and Plan:   1. Well woman exam with routine gynecological exam - Cytology - PAP  2. Routine screening for STI (sexually transmitted infection) Had cold sore recently, this was her first. Reviewed etiology and course of HSV 1/2. Reviewed how HSV screening is done, she would like this done but will wait several months - HIV antibody - RPR - Hepatitis C Antibody - Hepatitis B surface antigen  3. Breast mass Palpable mildly tender mass in right breast at 2 o'clock, 2-3 cm from areola - will sent for ultrasound, referred to [facility] - US Unlisted Procedure Breast; Future  4. Family history of ovarian cancer Will refer for genetic counseling  5. Right ovarian cyst Reviewed options for expectant versus surgical management. Reviewed surgical management would involved laparoscopic cystectomy, reviewed risks of procedure, expected course and recovery. I reviewed that as her pain is sometimes in right upper quadrant and sometimes right lower quadrant, it is unlikely that all of her abdominal pain is related to ovarian cyst. She reports she is having consistent pain for a year in right side and would like to proceed with  surgical removal of cyst. Reviewed that she may require oophorectomy, she verbalizes understanding. Will have return for pre-op planning/visit.   Will follow up results of pap smear/STI screen and manage accordingly. Encouraged improvement in diet and exercise.   Routine preventative health maintenance measures emphasized. Please refer to After Visit Summary for other counseling recommendations.   Baldemar LenisK. Meryl Davis, M.D. [facility]

## 2017-08-20 NOTE — Progress Notes (Signed)
Pt states when had cycle her cramps were real bad. Left

## 2017-08-21 ENCOUNTER — Telehealth: Payer: Self-pay

## 2017-08-21 LAB — CYTOLOGY - PAP
Bacterial vaginitis: POSITIVE — AB
CHLAMYDIA, DNA PROBE: NEGATIVE
Candida vaginitis: NEGATIVE
Diagnosis: NEGATIVE
Neisseria Gonorrhea: NEGATIVE
TRICH (WINDOWPATH): NEGATIVE

## 2017-08-21 LAB — HEPATITIS C ANTIBODY: Hep C Virus Ab: <0.1 s/co ratio (ref 0.0–0.9)

## 2017-08-21 LAB — RPR: RPR Ser Ql: NONREACTIVE

## 2017-08-21 LAB — HIV ANTIBODY (ROUTINE TESTING W REFLEX): HIV Screen 4th Generation wRfx: NONREACTIVE

## 2017-08-21 LAB — HEPATITIS B SURFACE ANTIGEN: HEP B S AG: NEGATIVE

## 2017-08-22 ENCOUNTER — Encounter (HOSPITAL_COMMUNITY): Payer: Self-pay | Admitting: Emergency Medicine

## 2017-08-22 ENCOUNTER — Ambulatory Visit (INDEPENDENT_AMBULATORY_CARE_PROVIDER_SITE_OTHER): Payer: Self-pay

## 2017-08-22 ENCOUNTER — Encounter: Payer: Self-pay | Admitting: Obstetrics and Gynecology

## 2017-08-22 ENCOUNTER — Ambulatory Visit (HOSPITAL_COMMUNITY)
Admission: EM | Admit: 2017-08-22 | Discharge: 2017-08-22 | Disposition: A | Discharge status: Home / Self Care | Payer: Self-pay | Attending: Family Medicine | Admitting: Family Medicine

## 2017-08-22 ENCOUNTER — Other Ambulatory Visit: Payer: Self-pay

## 2017-08-22 DIAGNOSIS — Z8249 Family history of ischemic heart disease and other diseases of the circulatory system: Secondary | ICD-10-CM | POA: Insufficient documentation

## 2017-08-22 DIAGNOSIS — J069 Acute upper respiratory infection, unspecified: Secondary | ICD-10-CM | POA: Insufficient documentation

## 2017-08-22 DIAGNOSIS — R112 Nausea with vomiting, unspecified: Secondary | ICD-10-CM | POA: Insufficient documentation

## 2017-08-22 DIAGNOSIS — Z87891 Personal history of nicotine dependence: Secondary | ICD-10-CM | POA: Insufficient documentation

## 2017-08-22 DIAGNOSIS — R0602 Shortness of breath: Secondary | ICD-10-CM | POA: Insufficient documentation

## 2017-08-22 DIAGNOSIS — F319 Bipolar disorder, unspecified: Secondary | ICD-10-CM | POA: Insufficient documentation

## 2017-08-22 DIAGNOSIS — R509 Fever, unspecified: Secondary | ICD-10-CM

## 2017-08-22 DIAGNOSIS — I10 Essential (primary) hypertension: Secondary | ICD-10-CM | POA: Insufficient documentation

## 2017-08-22 DIAGNOSIS — J029 Acute pharyngitis, unspecified: Secondary | ICD-10-CM

## 2017-08-22 HISTORY — DX: Unspecified osteoarthritis, unspecified site: M19.90

## 2017-08-22 LAB — POCT RAPID STREP A: Streptococcus, Group A Screen (Direct): NEGATIVE

## 2017-08-22 MED ORDER — ACETAMINOPHEN 325 MG PO TABS
650.0000 mg | ORAL_TABLET | Freq: Once | ORAL | Status: AC
  Administered 2017-08-22: 650 mg via ORAL

## 2017-08-22 MED ORDER — ACETAMINOPHEN 325 MG PO TABS
ORAL_TABLET | ORAL | Status: AC
  Filled 2017-08-22: qty 2

## 2017-08-22 MED ORDER — ONDANSETRON 4 MG PO TBDP: 4.0000 mg | ORAL_TABLET | Freq: Three times a day (TID) | ORAL | 0 refills | Status: DC | PRN

## 2017-08-22 MED ORDER — ONDANSETRON 4 MG PO TBDP
ORAL_TABLET | ORAL | Status: AC
  Filled 2017-08-22: qty 1

## 2017-08-22 MED ORDER — ONDANSETRON 4 MG PO TBDP
4.0000 mg | ORAL_TABLET | Freq: Once | ORAL | Status: AC
  Administered 2017-08-22: 4 mg via ORAL

## 2017-08-22 MED ORDER — METRONIDAZOLE 500 MG PO TABS: 500.0000 mg | ORAL_TABLET | Freq: Two times a day (BID) | ORAL | 0 refills | Status: DC

## 2017-08-22 NOTE — ED Triage Notes (Signed)
Onset 2 days ago of symptoms.  Headache and abdominal pain, reports a temp of 103 and sob.

## 2017-08-22 NOTE — Discharge Instructions (Addendum)
You may use over the counter ibuprofen or acetaminophen as needed for your fever and bodyaches.  For a sore throat, over the counter products such as [employer] Peroxyl Mouth Sore Rinse or Chloraseptic Sore Throat Spray may provide some temporary relief. Your rapid strep test was negative today. We have sent your throat swab for culture and will let you know of any positive results.

## 2017-08-22 NOTE — ED Provider Notes (Signed)
[facility]   [id number] 08/22/17 Arrival Time: 1727  ASSESSMENT & PLAN:  1. Fever, unspecified fever cause   2. SOB (shortness of breath)   3. Sore throat   4. Nausea and vomiting, intractability of vomiting not specified, unspecified vomiting type   5. Viral upper respiratory tract infection     Meds ordered this encounter  Medications  . acetaminophen (TYLENOL) tablet 650 mg  . ondansetron (ZOFRAN-ODT) disintegrating tablet 4 mg  . ondansetron (ZOFRAN-ODT) 4 MG disintegrating tablet    Sig: Take 1 tablet (4 mg total) by mouth every 8 (eight) hours as needed for nausea or vomiting.    Dispense:  15 tablet    Refill:  0   Feeling somewhat better after Tylenol. CXR negative. Rapid strep negative. Throat culture sent.  Discussed typical duration of symptoms with a suspected viral illness. OTC symptom care as needed. Ensure adequate fluid intake and rest. May f/u with PCP or here as needed.  Reviewed expectations re: course of current medical issues. Questions answered. Outlined signs and symptoms indicating need for more acute intervention. Patient verbalized understanding. After Visit Summary given.   SUBJECTIVE: History from: patient.  [name] [name] is a 27 y.o. female who presents with complaint of nasal congestion, post-nasal drainage, and a persistent dry cough. Associated nausea without emesis. Onset abrupt, approximately 2 days ago. Overall fatigued with body aches. SOB: mild; sporadic; none currently. Wheezing: none. Fever: yes; with chills. Overall decresedPO intake. Sick contacts: no. No abdominal pain or urinary symptoms. No pelvic pain or vaginal discharge. OTC treatment: Tylenol with mild help.  Social History   Tobacco Use  Smoking Status Former Smoker  . Packs/day: 0.50  . Years: 1.00  . Pack years: 0.50  . Types: Cigarettes  Smokeless Tobacco Never Used  Tobacco Comment   quit 2 years ago    ROS: As per HPI.   OBJECTIVE:  Vitals:    08/22/17 1807 08/22/17 1915  BP: (!) 131/93 133/82  Pulse: (!) 110 (!) 116  Resp: (!) 24 18  Temp: (!) 101.9 F (38.8 C) (!) 101.1 F (38.4 C)  TempSrc: Oral Oral  SpO2: 100% 100%     General appearance: alert; appears fatigued HEENT: nasal congestion; clear runny nose; throat with mild erythema Neck: supple without LAD CV: regular; tachycardic Lungs: unlabored respirations, symmetrical air entry; cough: mild; no respiratory distress Abd: soft; non-tender Ext: no edema Skin: warm and dry Psychological: alert and cooperative; normal mood and affect  Imaging: Dg Chest 2 View  Result Date: 08/22/2017 CLINICAL DATA:  Fever, dry cough, chest congestion, headache and shortness of breath for the past 4 days. EXAM: CHEST - 2 VIEW COMPARISON:  09/10/2015. FINDINGS: Normal sized heart. Clear lungs. Minimal thoracic spine degenerative changes. Cholecystectomy clips. IMPRESSION: No acute abnormality. Electronically Signed   By: Beckie Salts M.D.   On: 08/22/2017 19:20    No Known Allergies  Past Medical History:  Diagnosis Date  . Arthritis   . Bipolar 1 disorder (HCC)   . Depression   . Gallstones and inflammation of gallbladder with obstruction 10/19/2015  . Hypertension   . Suicidal overdose (HCC)    Family History  Problem Relation Age of Onset  . Heart attack Mother   . Stomach cancer Mother   . Hypertension Father    Social History   Socioeconomic History  . Marital status: Single    Spouse name: Not on file  . Number of children: 0  . Years of education:  Not on file  . Highest education level: Not on file  Occupational History  . Not on file  Social Needs  . Financial resource strain: Not on file  . Food insecurity:    Worry: Not on file    Inability: Not on file  . Transportation needs:    Medical: Not on file    Non-medical: Not on file  Tobacco Use  . Smoking status: Former Smoker    Packs/day: 0.50    Years: 1.00    Pack years: 0.50    Types:  Cigarettes  . Smokeless tobacco: Never Used  . Tobacco comment: quit 2 years ago  Substance and Sexual Activity  . Alcohol use: Yes    Alcohol/week: 0.6 oz    Types: 1 Cans of beer per week    Comment: vodka 2-3 times weekly  . Drug use: Not Currently    Types: Marijuana    Comment: Occasional  . Sexual activity: Yes    Partners: Female    Birth control/protection: None    Comment: with same sex  Lifestyle  . Physical activity:    Days per week: Not on file    Minutes per session: Not on file  . Stress: Not on file  Relationships  . Social connections:    Talks on phone: Not on file    Gets together: Not on file    Attends religious service: Not on file    Active member of club or organization: Not on file    Attends meetings of clubs or organizations: Not on file    Relationship status: Not on file  . Intimate partner violence:    Fear of current or ex partner: Not on file    Emotionally abused: Not on file    Physically abused: Not on file    Forced sexual activity: Not on file  Other Topics Concern  . Not on file  Social History Narrative  . Not on file           Mardella LaymanHagler, Lenord Fralix, MD 08/23/17 [id number]

## 2017-08-24 ENCOUNTER — Other Ambulatory Visit: Payer: Self-pay | Admitting: General Practice

## 2017-08-24 ENCOUNTER — Encounter: Payer: Self-pay | Admitting: General Practice

## 2017-08-24 DIAGNOSIS — Z8041 Family history of malignant neoplasm of ovary: Secondary | ICD-10-CM

## 2017-08-25 LAB — CULTURE, GROUP A STREP (THRC)

## 2017-08-28 ENCOUNTER — Telehealth: Payer: Self-pay

## 2017-08-28 DIAGNOSIS — Z8041 Family history of malignant neoplasm of ovary: Secondary | ICD-10-CM

## 2017-08-28 NOTE — Telephone Encounter (Signed)
Notified pt that we have scheduled her genetic counseling appt at wrong location so we need to cancel her appt that she had scheduled for tomorrow.  I also asked if it was okay that I give her call at the end of today tomorrow and LM with the appt on her VM.  Pt stated sure and thank you.

## 2017-08-29 ENCOUNTER — Ambulatory Visit (HOSPITAL_COMMUNITY): Admission: RE | Admit: 2017-08-29 | Payer: Medicaid Other | Source: Ambulatory Visit

## 2017-08-30 ENCOUNTER — Encounter: Payer: Self-pay | Admitting: Licensed Clinical Social Worker

## 2017-08-30 ENCOUNTER — Telehealth: Payer: Self-pay | Admitting: Licensed Clinical Social Worker

## 2017-08-30 NOTE — Telephone Encounter (Signed)
Received a call from [place] Federation[place]etta, [facility], to schedule the pt a genetic counseling appt. Pt has been scheduled to see Ike BeneBrianna Teapole on 9/12 at 11am. Addison NaegeliJeanetta will notify the pt.

## 2017-08-30 NOTE — Telephone Encounter (Addendum)
Scheduled genetic counseling for September 12th @ 1100.

## 2017-09-03 ENCOUNTER — Other Ambulatory Visit (HOSPITAL_COMMUNITY): Payer: Self-pay | Admitting: *Deleted

## 2017-09-03 DIAGNOSIS — N644 Mastodynia: Secondary | ICD-10-CM

## 2017-09-03 NOTE — Telephone Encounter (Signed)
[facility]  Pt stated that she saw it on her MyChart.  Pt asked if she would be charged because she only has Family Medicaid.  I advised the pt call and ask to see if she would need to pay upfront for the visit or is it where she can make a payment plan.  Pt stated that she will call the office and verify.  Pt did not have any further questions.

## 2017-09-27 ENCOUNTER — Inpatient Hospital Stay: Payer: Medicaid Other | Attending: Family Medicine | Admitting: Licensed Clinical Social Worker

## 2017-09-27 ENCOUNTER — Inpatient Hospital Stay: Payer: Medicaid Other

## 2017-09-27 ENCOUNTER — Encounter: Payer: Self-pay | Admitting: Licensed Clinical Social Worker

## 2017-09-27 DIAGNOSIS — Z8041 Family history of malignant neoplasm of ovary: Secondary | ICD-10-CM | POA: Insufficient documentation

## 2017-09-27 DIAGNOSIS — Z315 Encounter for genetic counseling: Secondary | ICD-10-CM

## 2017-09-27 NOTE — Progress Notes (Addendum)
REFERRING PROVIDER: Sloan Leiter, MD [place], [place] [address]  PRIMARY REASON FOR VISIT:  1. Family history of ovarian cancer    HISTORY OF PRESENT ILLNESS:   [name] [name], a 27 y.o. female, was seen for a [name] cancer genetics consultation at the request of Dr. Rosana Hoes due to a family history of ovarian cancer.  [name] [name] presents to clinic today to discuss the possibility of a hereditary predisposition to cancer, genetic testing, and to further clarify her future cancer risks, as well as potential cancer risks for family members.   [name] [name] is a 27 y.o. female with no personal history of cancer.    HORMONAL RISK FACTORS:  Menarche was at age 56 First live birth at age 50.  OCP use for approximately 0 years.  Ovaries intact: yes.  Hysterectomy: no.  Menopausal status: premenopausal.  HRT use: 0 years. Colonoscopy: no; not examined. Mammogram within the last year: yes for lump, was normal. Number of breast biopsies: 1.  Past Medical History:  Diagnosis Date  . Arthritis   . Bipolar 1 disorder ([place])   . Depression   . Family history of ovarian cancer   . Gallstones and inflammation of gallbladder with obstruction 10/19/2015  . Hypertension   . Suicidal overdose [facility])     Past Surgical History:  Procedure Laterality Date  . CHOLECYSTECTOMY N/A 11/04/2015   Procedure: LAPAROSCOPIC CHOLECYSTECTOMY WITH INTRAOPERATIVE CHOLANGIOGRAM;  Surgeon: Alphonsa Overall, MD;  Location: [place];  Service: General;  Laterality: N/A;    Social History   Socioeconomic History  . Marital status: Single    Spouse name: Not on file  . Number of children: 0  . Years of education: Not on file  . Highest education level: Not on file  Occupational History  . Not on file  Social Needs  . Financial resource strain: Not on file  . Food insecurity:    Worry: Not on file    Inability: Not on file  . Transportation needs:    Medical: Not on file    Non-medical: Not on file   Tobacco Use  . Smoking status: Former Smoker    Packs/day: 0.50    Years: 1.00    Pack years: 0.50    Types: Cigarettes  . Smokeless tobacco: Never Used  . Tobacco comment: quit 2 years ago  Substance and Sexual Activity  . Alcohol use: Yes    Alcohol/week: 1.0 standard drinks    Types: 1 Cans of beer per week    Comment: vodka 2-3 times weekly  . Drug use: Not Currently    Types: Marijuana    Comment: Occasional  . Sexual activity: Yes    Partners: Female    Birth control/protection: None    Comment: with same sex  Lifestyle  . Physical activity:    Days per week: Not on file    Minutes per session: Not on file  . Stress: Not on file  Relationships  . Social connections:    Talks on phone: Not on file    Gets together: Not on file    Attends religious service: Not on file    Active member of club or organization: Not on file    Attends meetings of clubs or organizations: Not on file    Relationship status: Not on file  Other Topics Concern  . Not on file  Social History Narrative  . Not on file     FAMILY HISTORY:  We  obtained a detailed, 4-generation family history.  Significant diagnoses are listed below: Family History  Problem Relation Age of Onset  . Heart attack Mother   . Ovarian cancer Mother   . Hypertension Father    [name] [name] has one daughter, age 63. She has two full brothers, ages 25 and 44, a full sister, age 66, a half brother through her dad in his 97's and a half brother through her mom in his 83's. [name] [name] has three nephews and two nieces.  [name] [name] father is living at 67. He has two sisters, living, and a brother who passed away at a young age. [name] [name] does not have much information about this side of the family. She does know both her grandparents passed from diabetes complications and her grandfather may have had cancer.  [name] [name] mother had a history of ovarian cancer. [name] [name] reports that she believes her mom went to get  her ovaries removed and the cancer was found, but that eventually she was cancer-free. She passed from a heart attack at 44. [name] [name] has two maternal aunts and five maternal uncles, she does not have information about them. Her maternal grandmother is still living in her 57's and her maternal grandfather has passed but she does not know details.  [name] [name] is unaware of previous family history of genetic testing for hereditary cancer risks. Patient's maternal ancestors are of African American/Caucasian descent, and paternal ancestors are of Native American ([place]) descent. There is no reported Ashkenazi Jewish ancestry. There is no known consanguinity.  GENETIC COUNSELING ASSESSMENT: [name] [name] is a 27 y.o. female with a family history of cancer which is somewhat suggestive of a Hereditary Cancer Predisposition Syndrome. We, therefore, discussed and recommended the following at today's visit.   DISCUSSION: We discussed that about 5-10% of cancer cases are hereditary. We discussed the BRCA1 and  BRCA2 genes in particular, noting that there are many other genes we know of that are associated with ovarian cancer. We reviewed the characteristics, features and inheritance patterns of hereditary cancer syndromes. We also discussed genetic testing, including the appropriate family members to test, the process of testing, insurance coverage and turn-around-time for results. We discussed the implications of a negative, positive and/or variant of uncertain significant result. We recommended [name] [name] pursue genetic testing for the [facility] Common Hereditary Cancers Panel.  The Common Hereditary Cancers Panel offered by [facility] includes sequencing and/or deletion duplication testing of the following 47 genes: APC, ATM, AXIN2, BARD1, BMPR1A, BRCA1, BRCA2, BRIP1, CDH1, CDKN2A (p14ARF), CDKN2A (p16INK4a), CKD4, CHEK2, CTNNA1, DICER1, EPCAM (Deletion/duplication testing only), GREM1 (promoter region  deletion/duplication testing only), KIT, MEN1, MLH1, MSH2, MSH3, MSH6, MUTYH, NBN, NF1, NHTL1, PALB2, PDGFRA, PMS2, POLD1, POLE, PTEN, RAD50, RAD51C, RAD51D, SDHB, SDHC, SDHD, SMAD4, SMARCA4. STK11, TP53, TSC1, TSC2, and VHL.  The following genes were evaluated for sequence changes only: SDHA and HOXB13 c.251G>A variant only.  We discussed that if she is found to have a mutation in one of these genes, it may impact future medical management recommendations such as increased cancer screenings and consideration of risk reducing surgeries.  A positive result could also have implications for the patient's family members.  A Negative result would mean we were unable to identify a hereditary component to her cancer, but does not rule out the possibility of a hereditary basis for her mother's cancer.  There could be mutations that are undetectable by current technology, or in genes not yet tested or identified to increase  cancer risk.    We discussed the potential to find a Variant of Uncertain Significance or VUS.  These are variants that have not yet been identified as pathogenic or benign, and it is unknown if this variant is associated with increased cancer risk or if this is a normal finding.  Most VUS's are reclassified to benign or likely benign.   It should not be used to make medical management decisions. With time, we suspect the lab will determine the significance of any VUS's identified if any.   Based on [name] [name]'s mother's ovarian cancer, she meets medical criteria for genetic testing. Despite that she meets criteria, she may still have an out of pocket cost. The lab will provide her with this cost.   We discussed that some people do not want to undergo genetic testing due to fear of genetic discrimination.  A federal law called the Genetic Information Non-Discrimination Act (GINA) of 2008 helps protect individuals against genetic discrimination based on their genetic test results.  It impacts  both health insurance and employment.  For health insurance, it protects against increased premiums, being kicked off insurance or being forced to take a test in order to be insured.  For employment it protects against hiring, firing and promoting decisions based on genetic test results.  Health status due to a cancer diagnosis is not protected under GINA.  This law does not protect life insurance, disability insurance, or other types of insurance.   PLAN: After considering the risks, benefits, and limitations, [name] [name]  provided informed consent to pursue genetic testing and the blood sample was sent to [facility] for analysis of the Common Hereditary Cancers Panel. Results should be available within approximately 2-3 weeks' time, at which point they will be disclosed by telephone to [name] [name], as will any additional recommendations warranted by these results. [name] [name] will receive a summary of her genetic counseling visit and a copy of her results once available. This information will also be available in Epic.  Lastly, we encouraged [name] [name] to remain in contact with cancer genetics annually so that we can continuously update the family history and inform her of any changes in cancer genetics and testing that may be of benefit for this family.   Ms.  Muilenburg questions were answered to her satisfaction today. Our contact information was provided should additional questions or concerns arise. Thank you for the referral and allowing [place] to share in the care of your patient.   Epimenio Foot, MS Genetic Counselor [email or web address] Phone: [phone]  The patient was seen for a total of 35 minutes in face-to-face genetic counseling.This patient was discussed with Drs. Magrinat, Lindi Adie and/or Burr Medico who agrees with the above.

## 2017-09-30 ENCOUNTER — Emergency Department (HOSPITAL_COMMUNITY): Payer: Medicaid Other

## 2017-09-30 ENCOUNTER — Emergency Department (HOSPITAL_COMMUNITY)
Admission: EM | Admit: 2017-09-30 | Discharge: 2017-09-30 | Disposition: A | Discharge status: Home / Self Care | Payer: Medicaid Other | Attending: Emergency Medicine | Admitting: Emergency Medicine

## 2017-09-30 ENCOUNTER — Encounter (HOSPITAL_COMMUNITY): Payer: Self-pay | Admitting: Emergency Medicine

## 2017-09-30 ENCOUNTER — Other Ambulatory Visit: Payer: Self-pay

## 2017-09-30 DIAGNOSIS — Z79899 Other long term (current) drug therapy: Secondary | ICD-10-CM | POA: Insufficient documentation

## 2017-09-30 DIAGNOSIS — Z87891 Personal history of nicotine dependence: Secondary | ICD-10-CM | POA: Insufficient documentation

## 2017-09-30 DIAGNOSIS — I10 Essential (primary) hypertension: Secondary | ICD-10-CM | POA: Insufficient documentation

## 2017-09-30 DIAGNOSIS — Z8543 Personal history of malignant neoplasm of ovary: Secondary | ICD-10-CM | POA: Insufficient documentation

## 2017-09-30 DIAGNOSIS — R102 Pelvic and perineal pain: Secondary | ICD-10-CM | POA: Insufficient documentation

## 2017-09-30 LAB — CBC WITH DIFFERENTIAL/PLATELET
ABS IMMATURE GRANULOCYTES: 0 10*3/uL (ref 0.0–0.1)
Basophils Absolute: 0 10*3/uL (ref 0.0–0.1)
Basophils Relative: 0 %
Eosinophils Absolute: 0.1 10*3/uL (ref 0.0–0.7)
Eosinophils Relative: 1 %
HEMATOCRIT: 36.4 % (ref 36.0–46.0)
Hemoglobin: 11.5 g/dL — ABNORMAL LOW (ref 12.0–15.0)
IMMATURE GRANULOCYTES: 0 %
LYMPHS ABS: 2 10*3/uL (ref 0.7–4.0)
LYMPHS PCT: 28 %
MCH: 28.5 pg (ref 26.0–34.0)
MCHC: 31.6 g/dL (ref 30.0–36.0)
MCV: 90.3 fL (ref 78.0–100.0)
MONOS PCT: 8 %
Monocytes Absolute: 0.5 10*3/uL (ref 0.1–1.0)
NEUTROS ABS: 4.4 10*3/uL (ref 1.7–7.7)
NEUTROS PCT: 63 %
Platelets: 278 10*3/uL (ref 150–400)
RBC: 4.03 MIL/uL (ref 3.87–5.11)
RDW: 12.6 % (ref 11.5–15.5)
WBC: 7 10*3/uL (ref 4.0–10.5)

## 2017-09-30 LAB — URINALYSIS, ROUTINE W REFLEX MICROSCOPIC
BILIRUBIN URINE: NEGATIVE
GLUCOSE, UA: NEGATIVE mg/dL
HGB URINE DIPSTICK: NEGATIVE
Ketones, ur: NEGATIVE mg/dL
Leukocytes, UA: NEGATIVE
Nitrite: NEGATIVE
Protein, ur: NEGATIVE mg/dL
SPECIFIC GRAVITY, URINE: 1.01 (ref 1.005–1.030)
pH: 6 (ref 5.0–8.0)

## 2017-09-30 LAB — COMPREHENSIVE METABOLIC PANEL
ALT: 19 U/L (ref 0–44)
AST: 17 U/L (ref 15–41)
Albumin: 3.9 g/dL (ref 3.5–5.0)
Alkaline Phosphatase: 49 U/L (ref 38–126)
Anion gap: 7 (ref 5–15)
BUN: 6 mg/dL (ref 6–20)
CHLORIDE: 105 mmol/L (ref 98–111)
CO2: 23 mmol/L (ref 22–32)
Calcium: 8.8 mg/dL — ABNORMAL LOW (ref 8.9–10.3)
Creatinine, Ser: 0.75 mg/dL (ref 0.44–1.00)
GFR calc Af Amer: >60 mL/min (ref >60)
GLUCOSE: 88 mg/dL (ref 70–99)
Potassium: 3.8 mmol/L (ref 3.5–5.1)
Sodium: 135 mmol/L (ref 135–145)
TOTAL PROTEIN: 7.3 g/dL (ref 6.5–8.1)
Total Bilirubin: 0.7 mg/dL (ref 0.3–1.2)

## 2017-09-30 LAB — POC URINE PREG, ED: Preg Test, Ur: NEGATIVE

## 2017-09-30 MED ORDER — NAPROXEN SODIUM 550 MG PO TABS: 550.0000 mg | ORAL_TABLET | Freq: Two times a day (BID) | ORAL | 0 refills | Status: DC

## 2017-09-30 MED ORDER — KETOROLAC TROMETHAMINE 15 MG/ML IJ SOLN
30.0000 mg | Freq: Once | INTRAMUSCULAR | Status: AC
  Administered 2017-09-30: 30 mg via INTRAMUSCULAR
  Filled 2017-09-30: qty 2

## 2017-09-30 NOTE — ED Triage Notes (Signed)
Pt c/o pelvic pain x 3 days. Hx ovarian cyst. Denies abnormal bleeding or discharge.

## 2017-09-30 NOTE — ED Provider Notes (Signed)
[facility] EMERGENCY DEPARTMENT Provider Note   CSN: [id number] Arrival date & time: 09/30/17  1940     History   Chief Complaint Chief Complaint  Patient presents with  . Pelvic Pain    HPI [name] [name] is a 27 y.o. female with family hx of ovarian cancer who presents to the ED with c/o pelvic pain that started 3 days ago. Hx of ovarian cyst. Patient denies vaginal bleeding or d/c. Patient did have ultrasound in June 2019 that showed a 6 cm cyst of right ovary. Patient is sexually active with one partner that is female. No hx of STD's.  HPI  Past Medical History:  Diagnosis Date  . Arthritis   . Bipolar 1 disorder ([facility])   . Depression   . Family history of ovarian cancer   . Gallstones and inflammation of gallbladder with obstruction 10/19/2015  . Hypertension   . Suicidal overdose [facility])     Patient Active Problem List   Diagnosis Date Noted  . Family history of ovarian cancer   . Cholelithiasis 11/04/2015  . Environmental allergies 05/28/2014  . Depression 04/29/2012    Past Surgical History:  Procedure Laterality Date  . CHOLECYSTECTOMY N/A 11/04/2015   Procedure: LAPAROSCOPIC CHOLECYSTECTOMY WITH INTRAOPERATIVE CHOLANGIOGRAM;  Surgeon: Ovidio Kin, MD;  Location: [facility] OR;  Service: General;  Laterality: N/A;     OB History    Gravida  1   Para  1   Term  1   Preterm  0   AB  0   Living  1     SAB  0   TAB  0   Ectopic  0   Multiple  0   Live Births  1            Home Medications    Prior to Admission medications   Medication Sig Start Date End Date Taking? Authorizing Provider  acetaminophen (TYLENOL) 500 MG tablet Take 1,000 mg by mouth every 6 (six) hours as needed for moderate pain.    [provider]  ibuprofen (ADVIL,MOTRIN) 800 MG tablet Take 1 tablet (800 mg total) by mouth 3 (three) times daily. 07/14/17   McDonald, Mia A, PA-C  meloxicam (MOBIC) 7.5 MG tablet Take 7.5 mg by mouth daily.     [provider]  Multiple Vitamins-Minerals (HAIR SKIN AND NAILS FORMULA) TABS Take 1 tablet by mouth daily.    [provider]  naproxen sodium (ANAPROX DS) 550 MG tablet Take 1 tablet (550 mg total) by mouth 2 (two) times daily with a meal. 09/30/17   Trishelle Devora, [place], NP  ondansetron (ZOFRAN-ODT) 4 MG disintegrating tablet Take 1 tablet (4 mg total) by mouth every 8 (eight) hours as needed for nausea or vomiting. 08/22/17   Mardella Layman, MD    Family History Family History  Problem Relation Age of Onset  . Heart attack Mother   . Ovarian cancer Mother   . Hypertension Father     Social History Social History   Tobacco Use  . Smoking status: Former Smoker    Packs/day: 0.50    Years: 1.00    Pack years: 0.50    Types: Cigarettes  . Smokeless tobacco: Never Used  . Tobacco comment: quit 2 years ago  Substance Use Topics  . Alcohol use: Yes    Alcohol/week: 1.0 standard drinks    Types: 1 Cans of beer per week    Comment: vodka 2-3 times weekly  . Drug use:  Not Currently    Types: Marijuana    Comment: Occasional     Allergies   Patient has no known allergies.   Review of Systems Review of Systems  Gastrointestinal:       Pelvic pain  All other systems reviewed and are negative.    Physical Exam Updated Vital Signs BP 130/89   Pulse 83   Temp 98.5 F (36.9 C)   Resp 16   LMP 08/13/2017   SpO2 100%   Physical Exam  Constitutional: She appears well-developed and well-nourished. No distress.  HENT:  Head: Normocephalic.  Eyes: Conjunctivae and EOM are normal.  Neck: Normal range of motion. Neck supple.  Cardiovascular: Normal rate and regular rhythm.  Pulmonary/Chest: Effort normal and breath sounds normal.  Abdominal: Soft. Bowel sounds are normal. There is tenderness.  Mildly tender with deep palpation lower abdomen. No guarding, no rebound.  Genitourinary:  Genitourinary Comments: Patient declined pelvic exam stating she has no  bleeding, no d/c and no concern for STD's.   Musculoskeletal: Normal range of motion.  Neurological: She is alert.  Skin: Skin is warm and dry.  Psychiatric: She has a normal mood and affect.  Nursing note and vitals reviewed.    ED Treatments / Results  Labs (all labs ordered are listed, but only abnormal results are displayed) Labs Reviewed  CBC WITH DIFFERENTIAL/PLATELET - Abnormal; Notable for the following components:      Result Value   Hemoglobin 11.5 (*)    All other components within normal limits  COMPREHENSIVE METABOLIC PANEL - Abnormal; Notable for the following components:   Calcium 8.8 (*)    All other components within normal limits  URINALYSIS, ROUTINE W REFLEX MICROSCOPIC  POC URINE PREG, ED   Radiology US Pelvic Complete With Transvaginal  Result Date: 09/30/2017 CLINICAL DATA:  Patient with pelvic pain. EXAM: TRANSABDOMINAL AND TRANSVAGINAL ULTRASOUND OF PELVIS TECHNIQUE: Both transabdominal and transvaginal ultrasound examinations of the pelvis were performed. Transabdominal technique was performed for global imaging of the pelvis including uterus, ovaries, adnexal regions, and pelvic cul-de-sac. It was necessary to proceed with endovaginal exam following the transabdominal exam to visualize the adnexal structures. COMPARISON:  Pelvic ultrasound 11/20/2016 FINDINGS: Uterus Measurements: 9.3 x 4.9 x 5.2 cm. No fibroids or other mass visualized. Endometrium Thickness: 16 mm.  No focal abnormality visualized. Right ovary Measurements: 3.7 x 2.2 x 1.9 cm. Normal appearance/no adnexal mass. Left ovary Measurements: 3.5 x 2.9 x 2.9 cm. Normal appearance/no adnexal mass. Other findings No abnormal free fluid. IMPRESSION: No acute process. Electronically Signed   By: Annia Belt M.D.   On: 09/30/2017 21:36    Procedures Procedures (including critical care time)  Medications Ordered in ED Medications  ketorolac (TORADOL) 15 MG/ML injection 30 mg (30 mg Intramuscular Given  09/30/17 2221)     Initial Impression / Assessment and Plan / ED Course  I have reviewed the triage vital signs and the nursing notes. 27 y.o. female here with pelvic pain stable for d/c without fever, no acute abdomen, normal labs and ultrasound. Treated with toradol for pain in the ED. Rx Anaprox and [facility]   Final Clinical Impressions(s) / ED Diagnoses   Final diagnoses:  Pelvic pain  Pelvic pain in female    ED Discharge Orders         Ordered    naproxen sodium (ANAPROX DS) 550 MG tablet  2 times daily with meals  09/30/17 2259           Janne NapoleonNeese, Jacquelinne Speak M, NP 09/30/17 2317    Virgina Norfolkuratolo, [place], DO 09/30/17 2344

## 2017-09-30 NOTE — Discharge Instructions (Addendum)
Your ultrasound tonight was normal. [facility]

## 2017-10-08 ENCOUNTER — Telehealth: Payer: Self-pay | Admitting: Licensed Clinical Social Worker

## 2017-10-08 ENCOUNTER — Encounter: Payer: Self-pay | Admitting: Licensed Clinical Social Worker

## 2017-10-08 ENCOUNTER — Encounter: Payer: Self-pay | Admitting: *Deleted

## 2017-10-08 ENCOUNTER — Ambulatory Visit: Payer: Self-pay | Admitting: Licensed Clinical Social Worker

## 2017-10-08 DIAGNOSIS — Z1379 Encounter for other screening for genetic and chromosomal anomalies: Secondary | ICD-10-CM

## 2017-10-08 DIAGNOSIS — Z8041 Family history of malignant neoplasm of ovary: Secondary | ICD-10-CM

## 2017-10-08 NOTE — Telephone Encounter (Signed)
Revealed negative genetic testing.  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  This normal result is reassuring and indicates that is unlikely that there is an increased risk of cancer due to a mutation in one of these genes.  However, genetic testing is not perfect, and cannot definitively rule out a hereditary cause.  It will be important for her to keep in contact with genetics to learn if any additional testing may be needed in the future. We also discussed that her siblings and maternal aunts/uncles could also potentially benefit from genetic counseling and testing.

## 2017-10-08 NOTE — Progress Notes (Signed)
HPI:  [name] [name] was previously seen in the [facility] on 09/27/2017 due to a family history of ovarian cancer and concerns regarding a hereditary predisposition to cancer. Please refer to our prior cancer genetics clinic note for more information regarding [name] [name]'s medical, social and family histories, and our assessment and recommendations, at the time. [name] [name] recent genetic test results were disclosed to her, as well as recommendations warranted by these results. These results and recommendations are discussed in more detail below.   FAMILY HISTORY:  We obtained a detailed, 4-generation family history.  Significant diagnoses are listed below: Family History  Problem Relation Age of Onset  . Heart attack Mother   . Ovarian cancer Mother   . Hypertension Father     [name] [name] has one daughter, age 61. She has two full brothers, ages 19 and 55, a full sister, age 59, a half brother through her dad in his 76's and a half brother through her mom in his 47's. [name] [name] has three nephews and two nieces.  [name] [name] father is living at 53. He has two sisters, living, and a brother who passed away at a young age. [name] [name] does not have much information about this side of the family. She does know both her grandparents passed from diabetes complications and her grandfather may have had cancer.  [name] [name] mother had a history of ovarian cancer. [name] [name] reports that she believes her mom went to get her ovaries removed and the cancer was found, but that eventually she was cancer-free. She passed from a heart attack at 44. [name] [name] has two maternal aunts and five maternal uncles, she does not have information about them. Her maternal grandmother is still living in her 80's and her maternal grandfather has passed but she does not know details.  [name] [name] is unaware of previous family history of genetic testing for hereditary cancer risks. Patient's maternal  ancestors are of African American/Caucasian descent, and paternal ancestors are of Native American ([place]) descent. There is no reported Ashkenazi Jewish ancestry. There is no known consanguinity.  GENETIC TEST RESULTS: Genetic testing performed through [facility]'s Common Hereditary Cancers Panel reported out on 10/04/2017 showed no pathogenic mutations. The Common Hereditary Cancers Panel offered by [facility] includes sequencing and/or deletion duplication testing of the following 47 genes: APC, ATM, AXIN2, BARD1, BMPR1A, BRCA1, BRCA2, BRIP1, CDH1, CDKN2A (p14ARF), CDKN2A (p16INK4a), CKD4, CHEK2, CTNNA1, DICER1, EPCAM (Deletion/duplication testing only), GREM1 (promoter region deletion/duplication testing only), KIT, MEN1, MLH1, MSH2, MSH3, MSH6, MUTYH, NBN, NF1, NHTL1, PALB2, PDGFRA, PMS2, POLD1, POLE, PTEN, RAD50, RAD51C, RAD51D, SDHB, SDHC, SDHD, SMAD4, SMARCA4. STK11, TP53, TSC1, TSC2, and VHL.  The following genes were evaluated for sequence changes only: SDHA and HOXB13 c.251G>A variant only.  A variant of uncertain significance (VUS) in a gene called RAD51D was also noted.   The test report will be scanned into EPIC and will be located under the Molecular Pathology section of the Results Review tab. A portion of the result report is included below for reference.     We discussed with [name] [name] that because current genetic testing is not perfect, it is possible there may be a gene mutation in one of these genes that current testing cannot detect, but that chance is small.  We also discussed, that there could be another gene that has not yet been discovered, or that we have not yet tested, that is responsible for the cancer diagnoses in the family. It  is also possible there is a hereditary cause for the cancer in the family that [name] [name] did not inherit and therefore was not identified in her testing.  Therefore, it is important to remain in touch with cancer genetics in the future so that we can  continue to offer [name] [name] the most up to date genetic testing.   Regarding the VUS in RAD51D: At this time, it is unknown if this variant is associated with increased cancer risk or if this is a normal finding, but most variants such as this get reclassified to being inconsequential. It should not be used to make medical management decisions. With time, we suspect the lab will determine the significance of this variant, if any. If we do learn more about it, we will try to contact [name] [name] to discuss it further. However, it is important to stay in touch with [place] periodically and keep the address and phone number up to date.  ADDITIONAL GENETIC TESTING: We discussed with [name] [name] that there are other genes that are associated with increased cancer risk that can be analyzed. The laboratories that offer this testing look at these additional genes via a hereditary cancer gene panel. Should [name] [name] wish to pursue additional genetic testing, we are happy to discuss and coordinate this testing, at any time.    CANCER SCREENING RECOMMENDATIONS: [name] [name] test result is considered negative (normal).  This means that we have not identified a hereditary cause for her family history of ovarian cancer at this time.  This normal indicates that it is unlikely [name] [name] has an increased risk of cancer due to a mutation in one of these genes.  While reassuring, this does not definitively rule out a hereditary predisposition to cancer. It is still possible that there could be genetic mutations that are undetectable by current technology, or genetic mutations in genes that have not been tested or identified to increase cancer risk.  Therefore, it is recommended she continue to follow the cancer management and screening guidelines provided by her oncology and primary healthcare provider. An individual's cancer risk is not determined by genetic test results alone.  Overall cancer risk assessment includes additional  factors such as personal medical history, family history, etc.  These should be used to make a personalized plan for cancer prevention and surveillance.    Based on the Ms. Adkins's family history of cancer, as well as her genetic test results, statistical model Harriett Rush was used to estimate her risk of developing breast cancer. This estimates her lifetime risk of developing  to be approximately 10.9%. The patient's lifetime breast cancer risk is a preliminary estimate based on available information using one of several models endorsed by the [place] (ACS). The ACS recommends consideration of breast MRI screening as an adjunct to mammography for patients at high risk (defined as 20% or greater lifetime risk). A more detailed breast cancer risk assessment can be considered, if clinically indicated.       RECOMMENDATIONS FOR FAMILY MEMBERS:  Relatives in this family might be at some increased risk of developing cancer, over the general population risk, simply due to the family history of cancer.  We recommended women in this family have a yearly mammogram beginning at age 42, or 77 years younger than the earliest onset of cancer, an annual clinical breast exam, and perform monthly breast self-exams. Women in this family should also have a gynecological exam as recommended by their primary provider. All family members  should have a colonoscopy by age 54 (or as directed by their doctors).  All family members should inform their physicians about the family history of cancer so their doctors can make the most appropriate screening recommendations for them.   It is also possible there is a hereditary cause for the cancer in Ms. Goheen's mother that she did not inherit and therefore was not identified in her.  We recommended her other siblings and maternal uncles/aunts have genetic counseling and testing. Ms. Pointer will let us know if we can be of any assistance in coordinating genetic  counseling and/or testing for these family members.   FOLLOW-UP: Lastly, we discussed with Ms. Sherfield that cancer genetics is a rapidly advancing field and it is possible that new genetic tests will be appropriate for her and/or her family members in the future. We encouraged her to remain in contact with cancer genetics on an annual basis so we can update her personal and family histories and let her know of advances in cancer genetics that may benefit this family.   Our contact number was provided. Ms. Mcsorley questions were answered to her satisfaction, and she knows she is welcome to call us at anytime with additional questions or concerns.   Epimenio Foot, MS Genetic Counselor [email or web address] Phone: [phone]

## 2017-10-17 ENCOUNTER — Ambulatory Visit: Payer: Self-pay | Admitting: Obstetrics and Gynecology

## 2017-10-18 ENCOUNTER — Other Ambulatory Visit (HOSPITAL_COMMUNITY): Payer: Self-pay | Admitting: Obstetrics and Gynecology

## 2017-10-18 ENCOUNTER — Ambulatory Visit
Admission: RE | Admit: 2017-10-18 | Discharge: 2017-10-18 | Disposition: A | Discharge status: Home / Self Care | Payer: Self-pay | Source: Ambulatory Visit | Attending: Obstetrics and Gynecology | Admitting: Obstetrics and Gynecology

## 2017-10-18 ENCOUNTER — Encounter (HOSPITAL_COMMUNITY): Payer: Self-pay

## 2017-10-18 ENCOUNTER — Ambulatory Visit (HOSPITAL_COMMUNITY)
Admission: RE | Admit: 2017-10-18 | Discharge: 2017-10-18 | Disposition: A | Discharge status: Home / Self Care | Payer: Self-pay | Source: Ambulatory Visit | Attending: Obstetrics and Gynecology | Admitting: Obstetrics and Gynecology

## 2017-10-18 VITALS — BP 114/80 | Ht 66.0 in | Wt 209.2 lb

## 2017-10-18 DIAGNOSIS — N644 Mastodynia: Secondary | ICD-10-CM

## 2017-10-18 DIAGNOSIS — Z1239 Encounter for other screening for malignant neoplasm of breast: Secondary | ICD-10-CM

## 2017-10-18 NOTE — Progress Notes (Addendum)
Complaints of left inner breast pain x 3 months that is constant. Patient rates the pain at a 7 out of 10.  Pap Smear: Pap smear not completed today. Last Pap smear was 08/20/2017 at [facility] and normal. Per patient has no history of an abnormal Pap smear. Last Pap smear result is in Epic.  Physical exam: Breasts Breasts symmetrical. No skin abnormalities bilateral breasts. No nipple retraction bilateral breasts. No nipple discharge bilateral breasts. No lymphadenopathy. No lumps palpated bilateral breasts. Complaints of left lower inner quadrant breast tenderness on exam. Referred patient to the [facility] for a left breast ultrasound. Appointment scheduled for Thursday, October 18, 2017 at 0900.        Pelvic/Bimanual No Pap smear completed today since last Pap smear was 08/20/2017. Pap smear not indicated per BCCCP guidelines.   Smoking History: Patient is a former smoker that quit 7 years ago.  Patient Navigation: Patient education provided. Access to services provided for patient through BCCCP program.   Breast and Cervical Cancer Risk Assessment: Patient has no family history of breast cancer, known genetic mutations, or radiation treatment to the chest before age 62. Patient has no history of cervical dysplasia, immunocompromised, or DES exposure in-utero. Breast cancer risk assessment completed. No risk calculated due to patient is less than 19 years old.

## 2017-10-18 NOTE — Patient Instructions (Signed)
Explained breast self awareness with [name] [name]. Patient did not need a Pap smear today due to last Pap smear was was 08/20/2017. Let her know BCCCP will cover Pap smears every 3 years unless has a history of abnormal Pap smears. Referred patient to the [facility] for a left breast ultrasound. Appointment scheduled for Thursday, October 18, 2017 at 0900. [name] [name] verbalized understanding.  Giavana Rooke, Kathaleen Maser, RN 8:18 AM

## 2017-10-18 NOTE — Addendum Note (Signed)
Encounter addended by: Priscille Heidelberg, RN on: 10/18/2017 9:13 AM  Actions taken: Sign clinical note

## 2017-11-14 ENCOUNTER — Encounter (HOSPITAL_COMMUNITY): Payer: Self-pay | Admitting: *Deleted

## 2017-11-17 ENCOUNTER — Ambulatory Visit (HOSPITAL_COMMUNITY)
Admission: EM | Admit: 2017-11-17 | Discharge: 2017-11-17 | Disposition: A | Discharge status: Home / Self Care | Payer: PRIVATE HEALTH INSURANCE | Attending: Family | Admitting: Family

## 2017-11-17 ENCOUNTER — Encounter (HOSPITAL_COMMUNITY): Payer: Self-pay

## 2017-11-17 DIAGNOSIS — M62838 Other muscle spasm: Secondary | ICD-10-CM

## 2017-11-17 DIAGNOSIS — M5412 Radiculopathy, cervical region: Secondary | ICD-10-CM

## 2017-11-17 DIAGNOSIS — G542 Cervical root disorders, not elsewhere classified: Secondary | ICD-10-CM

## 2017-11-17 DIAGNOSIS — R51 Headache: Secondary | ICD-10-CM

## 2017-11-17 MED ORDER — MELOXICAM 7.5 MG PO TABS: 7.5000 mg | ORAL_TABLET | Freq: Every day | ORAL | 0 refills | Status: DC

## 2017-11-17 MED ORDER — KETOROLAC TROMETHAMINE 60 MG/2ML IM SOLN
INTRAMUSCULAR | Status: AC
  Filled 2017-11-17: qty 2

## 2017-11-17 MED ORDER — KETOROLAC TROMETHAMINE 60 MG/2ML IM SOLN
60.0000 mg | Freq: Once | INTRAMUSCULAR | Status: AC
  Administered 2017-11-17: 60 mg via INTRAMUSCULAR

## 2017-11-17 MED ORDER — CYCLOBENZAPRINE HCL 5 MG PO TABS: 5.0000 mg | ORAL_TABLET | Freq: Three times a day (TID) | ORAL | 0 refills | Status: DC | PRN

## 2017-11-17 NOTE — Discharge Instructions (Addendum)
Suspect muscle spasm. However as we discussed if you begin to feel worse, have fever, chills, cough, or general malaise - go immediately to emergency room.   Start flexeril as needed Do not drive or operate heavy machinery while on muscle relaxant. Please do not drink alcohol. Only take this medication as needed for acute muscle spasm at bedtime. This medication make you feel drowsy so be very careful.  Stop taking if become too drowsy or somnolent as this puts you at risk for falls.   Start mobic TOMORROW since we gave you injection with Toradol today. Please take with food. Please stop other antiinflammatories at home including advil, aleve, ibuprofen, naprosyn as Mobic is similar to these medications.   If there is no improvement in your symptoms, or if there is any worsening of symptoms, or if you have any additional concerns, please return for re-evaluation; or, if we are closed, consider going to the Emergency Room for evaluation if symptoms urgent.

## 2017-11-17 NOTE — ED Provider Notes (Signed)
[facility]    CSN: [id number] Arrival date & time: 11/17/17  1008     History   Chief Complaint Chief Complaint  Patient presents with  . Neck Stiff  . Headache    HPI [name] [name] is a 27 y.o. female.   CC: Neck stiff x 6 days, gradual worsening. First noticed while in [place] 6 days ago. Slow onset. Pain with movement- lateral and extension.  NO vision changes. No injury. Tried cream for muscle and  Tylenol with some relief.  Also describes numbness in left 5th metacarpal. Numbness comes from the neck and runs all the way down left arm. No CP.   If moves neck to stretch, worsens HA. HA is posterior , and frontal, 7/10.   No rash, sob, trouble swallowing.   'Felt warm' yesterday, took temperature tmax 99.47F. No tactile warmth today.  No chills, cough, congestion.  Sick contacts: none  Lives in house, with fiance and daughter whom are not sick.   Traveled to [place] 6 days ago, and then returned 3 days later.   No CKD. NO h/o GI bleed  No drug or alcohol use.   Plans to go to work today if pain can be better controlled. She has desk job.     Past Medical History:  Diagnosis Date  . Arthritis   . Bipolar 1 disorder (HCC)   . Depression   . Family history of ovarian cancer   . Gallstones and inflammation of gallbladder with obstruction 10/19/2015  . Hypertension   . Suicidal overdose [facility])     Patient Active Problem List   Diagnosis Date Noted  . Genetic testing 10/08/2017  . Family history of ovarian cancer   . Cholelithiasis 11/04/2015  . Environmental allergies 05/28/2014  . Depression 04/29/2012    Past Surgical History:  Procedure Laterality Date  . CHOLECYSTECTOMY N/A 11/04/2015   Procedure: LAPAROSCOPIC CHOLECYSTECTOMY WITH INTRAOPERATIVE CHOLANGIOGRAM;  Surgeon: Ovidio Kin, MD;  Location: [facility] OR;  Service: General;  Laterality: N/A;    OB History    Gravida  1   Para  1   Term  1   Preterm  0   AB  0   Living  1     SAB    0   TAB  0   Ectopic  0   Multiple  0   Live Births  1            Home Medications    Prior to Admission medications   Medication Sig Start Date End Date Taking? Authorizing Provider  acetaminophen (TYLENOL) 500 MG tablet Take 1,000 mg by mouth every 6 (six) hours as needed for moderate pain.    [provider]  cyclobenzaprine (FLEXERIL) 5 MG tablet Take 1 tablet (5 mg total) by mouth 3 (three) times daily as needed for muscle spasms. 11/17/17   Allegra Grana, FNP  ibuprofen (ADVIL,MOTRIN) 800 MG tablet Take 1 tablet (800 mg total) by mouth 3 (three) times daily. 07/14/17   McDonald, Mia A, PA-C  meloxicam (MOBIC) 7.5 MG tablet Take 1 tablet (7.5 mg total) by mouth daily. Take with food 11/17/17   Allegra Grana, FNP  Multiple Vitamins-Minerals (HAIR SKIN AND NAILS FORMULA) TABS Take 1 tablet by mouth daily.    [provider]  naproxen sodium (ANAPROX DS) 550 MG tablet Take 1 tablet (550 mg total) by mouth 2 (two) times daily with a meal. Patient not taking: Reported on 10/18/2017 09/30/17  Damian Leavell, Hope M, NP  ondansetron (ZOFRAN-ODT) 4 MG disintegrating tablet Take 1 tablet (4 mg total) by mouth every 8 (eight) hours as needed for nausea or vomiting. 08/22/17   Mardella Layman, MD    Family History Family History  Problem Relation Age of Onset  . Heart attack Mother   . Ovarian cancer Mother   . Hypertension Father     Social History Social History   Tobacco Use  . Smoking status: Former Smoker    Packs/day: 0.50    Years: 1.00    Pack years: 0.50    Types: Cigarettes  . Smokeless tobacco: Never Used  . Tobacco comment: Quit 7 years ago  Substance Use Topics  . Alcohol use: Not Currently    Alcohol/week: 1.0 standard drinks    Types: 1 Cans of beer per week    Comment: vodka 2-3 times weekly  . Drug use: Not Currently    Types: Marijuana    Comment: Occasional     Allergies   Patient has no known allergies.   Review of  Systems Review of Systems  Constitutional: Negative for chills, fatigue and fever.  HENT: Negative for congestion, sore throat and trouble swallowing.   Eyes: Negative for visual disturbance.  Respiratory: Negative for cough.   Cardiovascular: Negative for chest pain and palpitations.  Gastrointestinal: Negative for nausea and vomiting.  Musculoskeletal: Positive for neck pain and neck stiffness. Negative for back pain.  Neurological: Positive for numbness (left arm) and headaches. Negative for dizziness and seizures.     Physical Exam Triage Vital Signs ED Triage Vitals  Enc Vitals Group     BP 11/17/17 1112 (!) 127/99     Pulse Rate 11/17/17 1112 91     Resp 11/17/17 1112 18     Temp 11/17/17 1112 98 F (36.7 C)     Temp src --      SpO2 11/17/17 1112 99 %     Weight --      Height --      Head Circumference --      Peak Flow --      Pain Score 11/17/17 1110 10     Pain Loc --      Pain Edu? --      Excl. in GC? --    No data found.  Updated Vital Signs BP (!) 127/99   Pulse 91   Temp 98 F (36.7 C)   Resp 18   LMP 11/10/2017   SpO2 99%   Visual Acuity Right Eye Distance:   Left Eye Distance:   Bilateral Distance:    Right Eye Near:   Left Eye Near:    Bilateral Near:     Physical Exam  Constitutional: She appears well-developed and well-nourished.  HENT:  Head: Normocephalic and atraumatic.  Right Ear: Hearing, tympanic membrane, external ear and ear canal normal. No drainage, swelling or tenderness. No foreign bodies. Tympanic membrane is not erythematous and not bulging. No middle ear effusion. No decreased hearing is noted.  Left Ear: Hearing, tympanic membrane, external ear and ear canal normal. No drainage, swelling or tenderness. No foreign bodies. Tympanic membrane is not erythematous and not bulging.  No middle ear effusion. No decreased hearing is noted.  Nose: Nose normal. No rhinorrhea. Right sinus exhibits no maxillary sinus tenderness and no  frontal sinus tenderness. Left sinus exhibits no maxillary sinus tenderness and no frontal sinus tenderness.  Mouth/Throat: Uvula is midline, oropharynx is clear and moist and  mucous membranes are normal. No oropharyngeal exudate, posterior oropharyngeal edema, posterior oropharyngeal erythema or tonsillar abscesses.  Eyes: Conjunctivae are normal.  Neck: Spinous process tenderness and muscular tenderness present. No neck rigidity. Decreased range of motion present. No edema and no erythema present.    Diffuse tenderness of spinous process. No rash, erythema.  Able to flex neck forward however painful for patient.  Negative spurlings test.   Cardiovascular: Normal rate, regular rhythm, normal heart sounds and normal pulses.  Pulmonary/Chest: Effort normal and breath sounds normal. She has no wheezes. She has no rhonchi. She has no rales.  Lymphadenopathy:       Head (right side): No submental, no submandibular, no tonsillar, no preauricular, no posterior auricular and no occipital adenopathy present.       Head (left side): No submental, no submandibular, no tonsillar, no preauricular, no posterior auricular and no occipital adenopathy present.    She has no cervical adenopathy.  Neurological: She is alert.  BUE grip strength 5/5. Sensation intact BUE.   Skin: Skin is warm and dry.  Psychiatric: She has a normal mood and affect. Her speech is normal and behavior is normal. Thought content normal.  Vitals reviewed.    UC Treatments / Results  Labs (all labs ordered are listed, but only abnormal results are displayed) Labs Reviewed - No data to display  EKG None  Radiology No results found.  Procedures Procedures (including critical care time)  Medications Ordered in UC Medications  ketorolac (TORADOL) injection 60 mg (60 mg Intramuscular Given 11/17/17 1208)    Initial Impression / Assessment and Plan / UC Course  I have reviewed the triage vital signs and the nursing  notes.  Pertinent labs & imaging results that were available during my care of the patient were reviewed by me and considered in my medical decision making (see chart for details).      Final Clinical Impressions(s) / UC Diagnoses   Final diagnoses:  Muscle spasms of neck  Cervical nerve root impingement  Patient is alert and oriented. Afebrile. She is non toxic in appearance. Based on overall appearance, clinicial suspicion for more worrisome diagnosis of meningitis is low. Duration of pain has been 6 days with gradual worsening. Working diagnosis of muscular strain, spasm complicated by cervical nerve impingement. Patient educated on signs including worsened neck pain, stiffness, fever and advised to go to nearest ED for prompt evaluation. Given patient toradol 60mg  IM today , and she will go home with mobic ( starting tomorrow) and flexeril as needed. Patient verbalized understanding of all.    Discharge Instructions     Suspect muscle spasm. However as we discussed if you begin to feel worse, have fever, chills, cough, or general malaise - go immediately to emergency room.   Start flexeril as needed Do not drive or operate heavy machinery while on muscle relaxant. Please do not drink alcohol. Only take this medication as needed for acute muscle spasm at bedtime. This medication make you feel drowsy so be very careful.  Stop taking if become too drowsy or somnolent as this puts you at risk for falls.   Start mobic TOMORROW since we gave you injection with Toradol today. Please take with food. Please stop other antiinflammatories at home including advil, aleve, ibuprofen, naprosyn as Mobic is similar to these medications.   If there is no improvement in your symptoms, or if there is any worsening of symptoms, or if you have any additional concerns, please return for re-evaluation;  or, if we are closed, consider going to the Emergency Room for evaluation if symptoms urgent.      ED  Prescriptions    Medication Sig Dispense Auth. Provider   cyclobenzaprine (FLEXERIL) 5 MG tablet Take 1 tablet (5 mg total) by mouth 3 (three) times daily as needed for muscle spasms. 15 tablet Allegra Grana, FNP   meloxicam (MOBIC) 7.5 MG tablet Take 1 tablet (7.5 mg total) by mouth daily. Take with food 30 tablet Allegra Grana, FNP     Controlled Substance Prescriptions [place] Controlled Substance Registry consulted? Not Applicable   Allegra Grana, FNP 11/17/17 1230

## 2017-11-17 NOTE — ED Triage Notes (Signed)
Pt presents with complaints of neck pain and stiffness times three days. Patient also endorses mild fevers, headaches, nausea and left arm numbness.

## 2017-11-18 ENCOUNTER — Other Ambulatory Visit: Payer: Self-pay

## 2017-11-18 ENCOUNTER — Emergency Department (HOSPITAL_COMMUNITY)
Admission: EM | Admit: 2017-11-18 | Discharge: 2017-11-18 | Disposition: A | Discharge status: Home / Self Care | Payer: PRIVATE HEALTH INSURANCE | Attending: Emergency Medicine | Admitting: Emergency Medicine

## 2017-11-18 DIAGNOSIS — I10 Essential (primary) hypertension: Secondary | ICD-10-CM | POA: Insufficient documentation

## 2017-11-18 DIAGNOSIS — M436 Torticollis: Secondary | ICD-10-CM

## 2017-11-18 DIAGNOSIS — Z79899 Other long term (current) drug therapy: Secondary | ICD-10-CM | POA: Insufficient documentation

## 2017-11-18 DIAGNOSIS — Z87891 Personal history of nicotine dependence: Secondary | ICD-10-CM | POA: Insufficient documentation

## 2017-11-18 LAB — COMPREHENSIVE METABOLIC PANEL
ALBUMIN: 3.6 g/dL (ref 3.5–5.0)
ALT: 12 U/L (ref 0–44)
AST: 13 U/L — AB (ref 15–41)
Alkaline Phosphatase: 49 U/L (ref 38–126)
Anion gap: 5 (ref 5–15)
BUN: 8 mg/dL (ref 6–20)
CHLORIDE: 104 mmol/L (ref 98–111)
CO2: 26 mmol/L (ref 22–32)
Calcium: 9 mg/dL (ref 8.9–10.3)
Creatinine, Ser: 0.72 mg/dL (ref 0.44–1.00)
GFR calc Af Amer: >60 mL/min (ref >60)
GLUCOSE: 93 mg/dL (ref 70–99)
POTASSIUM: 3.7 mmol/L (ref 3.5–5.1)
Sodium: 135 mmol/L (ref 135–145)
Total Bilirubin: 0.4 mg/dL (ref 0.3–1.2)
Total Protein: 6.8 g/dL (ref 6.5–8.1)

## 2017-11-18 LAB — CBC WITH DIFFERENTIAL/PLATELET
Abs Immature Granulocytes: 0.02 10*3/uL (ref 0.00–0.07)
Basophils Absolute: 0.1 10*3/uL (ref 0.0–0.1)
Basophils Relative: 1 %
EOS ABS: 0.1 10*3/uL (ref 0.0–0.5)
EOS PCT: 2 %
HCT: 34.6 % — ABNORMAL LOW (ref 36.0–46.0)
HEMOGLOBIN: 10.9 g/dL — AB (ref 12.0–15.0)
Immature Granulocytes: 0 %
LYMPHS ABS: 2.3 10*3/uL (ref 0.7–4.0)
Lymphocytes Relative: 30 %
MCH: 28.1 pg (ref 26.0–34.0)
MCHC: 31.5 g/dL (ref 30.0–36.0)
MCV: 89.2 fL (ref 80.0–100.0)
MONO ABS: 0.6 10*3/uL (ref 0.1–1.0)
Monocytes Relative: 7 %
NEUTROS ABS: 4.8 10*3/uL (ref 1.7–7.7)
Neutrophils Relative %: 60 %
Platelets: 282 10*3/uL (ref 150–400)
RBC: 3.88 MIL/uL (ref 3.87–5.11)
RDW: 12.1 % (ref 11.5–15.5)
WBC: 7.9 10*3/uL (ref 4.0–10.5)
nRBC: 0 % (ref 0.0–0.2)

## 2017-11-18 LAB — I-STAT CG4 LACTIC ACID, ED: Lactic Acid, Venous: 0.34 mmol/L — ABNORMAL LOW (ref 0.5–1.9)

## 2017-11-18 LAB — I-STAT BETA HCG BLOOD, ED (MC, WL, AP ONLY): I-stat hCG, quantitative: <5 m[IU]/mL (ref <5)

## 2017-11-18 MED ORDER — DIAZEPAM 5 MG PO TABS: 5.0000 mg | ORAL_TABLET | Freq: Two times a day (BID) | ORAL | 0 refills | Status: DC | PRN

## 2017-11-18 NOTE — ED Triage Notes (Signed)
Patient states that she has had neck pain/stiffness x4 days. Also c/o nausea with a HA. States was seen at Urgent Care yesterday and was advised to come to hospital for an LP.

## 2017-11-18 NOTE — ED Provider Notes (Signed)
[facility] EMERGENCY DEPARTMENT Provider Note   CSN: [id number] Arrival date & time: 11/18/17  2046     History   Chief Complaint Chief Complaint  Patient presents with  . Neck Pain    HPI [name] [name] is a 27 y.o. female.  HPI Patient is a 27 year old female with a past medical history of bipolar, hypertension, and arthritis presents to the emergency department for evaluation of neck pain/stiffness, intermittent headache, and intermittent nausea that is been going on for approximately the past 4 days.  Patient reports that she was seen yesterday at urgent care at which time she was prescribed Mobic and Flexeril.  States that despite being prescribed these medications her symptoms have persisted.  Patient reports that her neck pain is worsened by any movement of her neck.  She denies any traumatic injury to her neck that preceded the onset of her symptoms.  States that her pain is somewhat worse when looking towards the right than when looking towards the left.  Her pain seems to radiate up into her head.  She has a headache that she describes as bandlike in nature.  It is intermittently relieved with NSAIDs.  Patient also reports mild nausea that is been ongoing for approximate last 4 days.  She denies any association between her headache and her nausea, stating that her nausea will come at random times.  She denies any fevers, chills, photophobia, phonophobia, or visual disturbance. She has "felt warm intermittently and endorses some intermittent tingling in her left pinky.   Past Medical History:  Diagnosis Date  . Arthritis   . Bipolar 1 disorder (HCC)   . Depression   . Family history of ovarian cancer   . Gallstones and inflammation of gallbladder with obstruction 10/19/2015  . Hypertension   . Suicidal overdose [facility])     Patient Active Problem List   Diagnosis Date Noted  . Genetic testing 10/08/2017  . Family history of ovarian cancer   .  Cholelithiasis 11/04/2015  . Environmental allergies 05/28/2014  . Depression 04/29/2012    Past Surgical History:  Procedure Laterality Date  . CHOLECYSTECTOMY N/A 11/04/2015   Procedure: LAPAROSCOPIC CHOLECYSTECTOMY WITH INTRAOPERATIVE CHOLANGIOGRAM;  Surgeon: Ovidio Kin, MD;  Location: [facility] OR;  Service: General;  Laterality: N/A;     OB History    Gravida  1   Para  1   Term  1   Preterm  0   AB  0   Living  1     SAB  0   TAB  0   Ectopic  0   Multiple  0   Live Births  1            Home Medications    Prior to Admission medications   Medication Sig Start Date End Date Taking? Authorizing Provider  acetaminophen (TYLENOL) 500 MG tablet Take 1,000 mg by mouth every 6 (six) hours as needed for moderate pain.   Yes [provider]  albuterol (PROVENTIL HFA;VENTOLIN HFA) 108 (90 Base) MCG/ACT inhaler Inhale 2 puffs into the lungs every 6 (six) hours as needed for wheezing or shortness of breath.   Yes [provider]  meloxicam (MOBIC) 7.5 MG tablet Take 1 tablet (7.5 mg total) by mouth daily. Take with food 11/17/17  Yes Arnett, Lyn Records, FNP  ondansetron (ZOFRAN-ODT) 4 MG disintegrating tablet Take 1 tablet (4 mg total) by mouth every 8 (eight) hours as needed for nausea or vomiting. 08/22/17  Yes  Mardella Layman, MD  diazepam (VALIUM) 5 MG tablet Take 1 tablet (5 mg total) by mouth every 12 (twelve) hours as needed for muscle spasms. 11/18/17   Willine Schwalbe, Winfield Rast, MD    Family History Family History  Problem Relation Age of Onset  . Heart attack Mother   . Ovarian cancer Mother   . Hypertension Father     Social History Social History   Tobacco Use  . Smoking status: Former Smoker    Packs/day: 0.50    Years: 1.00    Pack years: 0.50    Types: Cigarettes  . Smokeless tobacco: Never Used  . Tobacco comment: Quit 7 years ago  Substance Use Topics  . Alcohol use: Not Currently    Alcohol/week: 1.0 standard drinks    Types: 1 Cans  of beer per week    Comment: vodka 2-3 times weekly  . Drug use: Not Currently    Types: Marijuana    Comment: Occasional     Allergies   Patient has no known allergies.   Review of Systems Review of Systems  Constitutional: Negative for chills and fever (No fevers but has felt "warm").  HENT: Negative for ear pain and sore throat.   Eyes: Negative for photophobia, pain and visual disturbance.  Respiratory: Negative for cough and shortness of breath.   Cardiovascular: Negative for chest pain and palpitations.  Gastrointestinal: Positive for nausea. Negative for abdominal pain and vomiting.  Genitourinary: Negative for dysuria and hematuria.  Musculoskeletal: Positive for neck pain and neck stiffness. Negative for arthralgias and back pain.  Skin: Negative for color change and rash.  Neurological: Positive for numbness (Tingling in her left pinky intermittently) and headaches. Negative for seizures, syncope and weakness.  Psychiatric/Behavioral: Negative for agitation, behavioral problems and confusion.  All other systems reviewed and are negative.    Physical Exam Updated Vital Signs BP (!) 132/95 (BP Location: Right Arm)   Pulse 87   Temp 99.1 F (37.3 C) (Oral)   Resp 18   Ht 5\' 6"  (1.676 m)   Wt 94.8 kg   LMP 11/10/2017   SpO2 100%   BMI 33.73 kg/m   Physical Exam  Constitutional: She is oriented to person, place, and time. She appears well-developed and well-nourished. No distress.  HENT:  Head: Normocephalic and atraumatic.  Eyes: Pupils are equal, round, and reactive to light. Conjunctivae and EOM are normal.  Neck:  Patient with tenderness to palpation over the left trapezius and into the left sternocleidomastoid.  She has limited range of motion especially when looking towards the right.  Cardiovascular: Normal rate and regular rhythm.  No murmur heard. Pulmonary/Chest: Effort normal and breath sounds normal. No respiratory distress.  Abdominal: Soft.  There is no tenderness.  Musculoskeletal: She exhibits no edema.  Neurological: She is alert and oriented to person, place, and time. No cranial nerve deficit or sensory deficit. She exhibits normal muscle tone.  Patient with 5 out of 5 strength in all extremities.  She endorses intermittent tingling in her left pinky, however on exam she has no sensory deficit at this time.  Negative Kernig and Brudzinski sign.   Skin: Skin is warm and dry.  Psychiatric: She has a normal mood and affect.  Nursing note and vitals reviewed.    ED Treatments / Results  Labs (all labs ordered are listed, but only abnormal results are displayed) Labs Reviewed  COMPREHENSIVE METABOLIC PANEL - Abnormal; Notable for the following components:  Result Value   AST 13 (*)    All other components within normal limits  CBC WITH DIFFERENTIAL/PLATELET - Abnormal; Notable for the following components:   Hemoglobin 10.9 (*)    HCT 34.6 (*)    All other components within normal limits  I-STAT CG4 LACTIC ACID, ED - Abnormal; Notable for the following components:   Lactic Acid, Venous 0.34 (*)    All other components within normal limits  I-STAT BETA HCG BLOOD, ED ([facility], [facility], [facility] ONLY)    EKG None  Radiology No results found.  Procedures Procedures (including critical care time)  Medications Ordered in ED Medications - No data to display   Initial Impression / Assessment and Plan / ED Course  I have reviewed the triage vital signs and the nursing notes.  Pertinent labs & imaging results that were available during my care of the patient were reviewed by me and considered in my medical decision making (see chart for details).     Patient is a 27 year old female with a past medical history above who presents to the emergency department for evaluation of pain, and had nausea.  Patient was seen at urgent care yesterday for the same complaints and started on Flexeril and Mobic.  Patient return to the emergency  department today as she has had very little improvement to her symptoms despite this intervention.  Secondary to patient's arrival complaint and reported subjective fevers laboratory values were obtained prior to my evaluation.  Patient's laboratory values are very reassuring and notably patient does not have an elevated white blood cell count.  On exam patient appears uncomfortable with any movement of her head.  She has tenderness to palpation in her left trapezius and sternocleidomastoid muscles.  She has difficulty with looking towards the right more than the left.  Negative Kernig and Brudzinski signs.  She has no appreciable neurological deficits on exam, but does endorse intermittent tingling in her left pinky.  Given patient's presentation I believe she is most likely suffering from torticollis at this time.  I believe that her headache is most likely a tension type headache given its bandlike nature and the radiation up from her neck.  She has no appreciable neurological deficits or history of trauma that would necessitate advanced imaging studies at this time.  Due to patient's lack of improvement with Flexeril and Mobic, I will discontinue patient's Flexeril and start her on Valium.  Patient given information to follow-up with a primary care physician in the next few days for reevaluation.  She was also given strict return precautions should her symptoms acutely worsen or if any new, concerning symptoms should arise.  My suspicion for an emergent cause of patient's neck pain and headache is low at this time.  She had a gradual onset of her headache making SAH unlikely.  No history of trauma to the head or neck to suggest other intercranial hemorrhage or skull fractures.  She has no elevated white blood cell count, is afebrile, and has no meningeal signs on exam to suggest meningitis at this time.  The care of this patient was discussed with my attending physician Dr. Jacqulyn Bath, who voices agreement with  work-up and ED disposition.  Final Clinical Impressions(s) / ED Diagnoses   Final diagnoses:  Torticollis    ED Discharge Orders         Ordered    diazepam (VALIUM) 5 MG tablet  Every 12 hours PRN     11/18/17 2246  Ilina Xu, Winfield Rast, MD 11/19/17 1109    Maia Plan, MD 11/19/17 [phone]

## 2017-12-06 NOTE — Telephone Encounter (Signed)
Opened in error

## 2018-02-28 ENCOUNTER — Ambulatory Visit (HOSPITAL_COMMUNITY)
Admission: EM | Admit: 2018-02-28 | Discharge: 2018-02-28 | Disposition: A | Discharge status: Home / Self Care | Payer: Self-pay | Attending: Family Medicine | Admitting: Family Medicine

## 2018-02-28 ENCOUNTER — Encounter (HOSPITAL_COMMUNITY): Payer: Self-pay | Admitting: Emergency Medicine

## 2018-02-28 ENCOUNTER — Ambulatory Visit (INDEPENDENT_AMBULATORY_CARE_PROVIDER_SITE_OTHER): Payer: Self-pay

## 2018-02-28 DIAGNOSIS — M2391 Unspecified internal derangement of right knee: Secondary | ICD-10-CM

## 2018-02-28 DIAGNOSIS — M25561 Pain in right knee: Secondary | ICD-10-CM

## 2018-02-28 MED ORDER — IBUPROFEN 800 MG PO TABS: 800.0000 mg | ORAL_TABLET | Freq: Three times a day (TID) | ORAL | 0 refills | Status: AC

## 2018-02-28 NOTE — ED Provider Notes (Signed)
[facility]    CSN: [id number] Arrival date & time: 02/28/18  0908     History   Chief Complaint Chief Complaint  Patient presents with  . Knee Injury    HPI [name] [name] is a 28 y.o. female.   HPI States that she jumped off her porch to chase her cat, and she landed on the ground she felt a sudden impact with severe pain in her right knee.  She states ever since then she has had pain with weightbearing.  A "crunching" feeling with range of motion.  Swelling.  She states that she has had problems with her left knee and had to have surgery for a torn tendon.  She did not fall.  She has not taken any medication.  Past Medical History:  Diagnosis Date  . Arthritis   . Bipolar 1 disorder (HCC)   . Depression   . Family history of ovarian cancer   . Gallstones and inflammation of gallbladder with obstruction 10/19/2015  . Hypertension   . Suicidal overdose [facility])     Patient Active Problem List   Diagnosis Date Noted  . Genetic testing 10/08/2017  . Family history of ovarian cancer   . Cholelithiasis 11/04/2015  . Environmental allergies 05/28/2014  . Depression 04/29/2012    Past Surgical History:  Procedure Laterality Date  . CHOLECYSTECTOMY N/A 11/04/2015   Procedure: LAPAROSCOPIC CHOLECYSTECTOMY WITH INTRAOPERATIVE CHOLANGIOGRAM;  Surgeon: Ovidio Kin, MD;  Location: [facility] OR;  Service: General;  Laterality: N/A;    OB History    Gravida  1   Para  1   Term  1   Preterm  0   AB  0   Living  1     SAB  0   TAB  0   Ectopic  0   Multiple  0   Live Births  1            Home Medications    Prior to Admission medications   Medication Sig Start Date End Date Taking? Authorizing Provider  acetaminophen (TYLENOL) 500 MG tablet Take 1,000 mg by mouth every 6 (six) hours as needed for moderate pain.    [provider]  albuterol (PROVENTIL HFA;VENTOLIN HFA) 108 (90 Base) MCG/ACT inhaler Inhale 2 puffs into the lungs every 6  (six) hours as needed for wheezing or shortness of breath.    [provider]  diazepam (VALIUM) 5 MG tablet Take 1 tablet (5 mg total) by mouth every 12 (twelve) hours as needed for muscle spasms. 11/18/17   Rozier, Winfield Rast, MD  meloxicam (MOBIC) 7.5 MG tablet Take 1 tablet (7.5 mg total) by mouth daily. Take with food 11/17/17   Allegra Grana, FNP  ondansetron (ZOFRAN-ODT) 4 MG disintegrating tablet Take 1 tablet (4 mg total) by mouth every 8 (eight) hours as needed for nausea or vomiting. 08/22/17   Mardella Layman, MD    Family History Family History  Problem Relation Age of Onset  . Heart attack Mother   . Ovarian cancer Mother   . Hypertension Father     Social History Social History   Tobacco Use  . Smoking status: Former Smoker    Packs/day: 0.50    Years: 1.00    Pack years: 0.50    Types: Cigarettes  . Smokeless tobacco: Never Used  . Tobacco comment: Quit 7 years ago  Substance Use Topics  . Alcohol use: Not Currently    Alcohol/week: 1.0 standard drinks  Types: 1 Cans of beer per week    Comment: vodka 2-3 times weekly  . Drug use: Not Currently    Types: Marijuana    Comment: Occasional     Allergies   Patient has no known allergies.   Review of Systems Review of Systems  Constitutional: Negative for chills and fever.  HENT: Negative for ear pain and sore throat.   Eyes: Negative for pain and visual disturbance.  Respiratory: Negative for cough and shortness of breath.   Cardiovascular: Negative for chest pain and palpitations.  Gastrointestinal: Negative for abdominal pain and vomiting.  Genitourinary: Negative for dysuria and hematuria.  Musculoskeletal: Positive for arthralgias and gait problem. Negative for back pain.  Skin: Negative for color change and rash.  Neurological: Negative for seizures and syncope.  All other systems reviewed and are negative.    Physical Exam Triage Vital Signs ED Triage Vitals  Enc Vitals Group      BP 02/28/18 0929 (!) 144/89     Pulse Rate 02/28/18 0929 81     Resp 02/28/18 0929 18     Temp 02/28/18 0929 98.4 F (36.9 C)     Temp Source 02/28/18 0929 Oral     SpO2 02/28/18 0929 100 %     Weight --      Height --      Head Circumference --      Peak Flow --      Pain Score 02/28/18 0935 7     Pain Loc --      Pain Edu? --      Excl. in GC? --    No data found.  Updated Vital Signs BP (!) 144/89 (BP Location: Left Arm)   Pulse 81   Temp 98.4 F (36.9 C) (Oral)   Resp 18   LMP 01/31/2018   SpO2 100%   Visual Acuity Right Eye Distance:   Left Eye Distance:   Bilateral Distance:    Right Eye Near:   Left Eye Near:    Bilateral Near:     Physical Exam Constitutional:      General: She is not in acute distress.    Appearance: She is well-developed.  HENT:     Head: Normocephalic and atraumatic.  Eyes:     Conjunctiva/sclera: Conjunctivae normal.     Pupils: Pupils are equal, round, and reactive to light.  Neck:     Musculoskeletal: Normal range of motion.  Cardiovascular:     Rate and Rhythm: Normal rate.  Pulmonary:     Effort: Pulmonary effort is normal. No respiratory distress.  Abdominal:     General: There is no distension.     Palpations: Abdomen is soft.  Musculoskeletal: Normal range of motion.     Comments: Antalgic gait.  Patient has no obvious effusion.  There is slight warmth.  Tenderness in the medial and lateral joint lines.  Patient has guarding and resistance to me attempting a medial joint ligament strain.  Pain with patellofemoral grind testing.  She can flex to 90 and extend fully.  No instability to drawer testing.  Skin:    General: Skin is warm and dry.  Neurological:     Mental Status: She is alert.      UC Treatments / Results  Labs (all labs ordered are listed, but only abnormal results are displayed) Labs Reviewed - No data to display  EKG None  Radiology Dg Knee Complete 4 Views Right  Result Date:  02/28/2018 CLINICAL  DATA:  Pain following twisting type injury EXAM: RIGHT KNEE - COMPLETE 4+ VIEW COMPARISON:  None. FINDINGS: Frontal, lateral, bilateral oblique, and sunrise patellar images were obtained. There is no appreciable fracture or dislocation. No joint effusion. There is no appreciable joint space narrowing. There is slight spurring along the posterior patella. There is also spurring along the anterior superior patella. No erosive change. IMPRESSION: Minimal patellofemoral joint osteoarthritic change. Spur along the anterior superior patella may indicate distal quadriceps tendinosis. No fracture or joint effusion. Electronically Signed   By: Bretta BangWilliam  Woodruff III M.D.   On: 02/28/2018 10:31    Procedures Procedures (including critical care time)  Medications Ordered in UC Medications - No data to display  Initial Impression / Assessment and Plan / UC Course  I have reviewed the triage vital signs and the nursing notes.  Pertinent labs & imaging results that were available during my care of the patient were reviewed by me and considered in my medical decision making (see chart for details).     Discussed likely strain.  Possible internal derangement.  Will treat conservatively and follow up ortho if fails to imporve Final Clinical Impressions(s) / UC Diagnoses   Final diagnoses:  None   Discharge Instructions   None    ED Prescriptions    None     Controlled Substance Prescriptions [place] Controlled Substance Registry consulted? Not Applicable   Eustace MooreNelson,  Sue, MD 02/28/18 1041

## 2018-02-28 NOTE — ED Triage Notes (Signed)
PT C/O: right knee pain... sts she slipped on the porch and twisted knee... sts she was able to catch herself before hitting the floor  Sts she went to the gym afterwards and was not able to workout d/t pain  Pain increases w/activity and reports some selling  TAKING MEDS: none  A&O x4... NAD... Ambulatory

## 2018-02-28 NOTE — Discharge Instructions (Addendum)
Wear brace while up Take the ibuprofen 3 x a day with food Ice to knee 20 min every few hours Take brace off and do gently motion exercises 2 x a day Follow up with orthopedic next week

## 2018-05-25 IMAGING — DX DG CHEST 2V
2 series · 2 of 2 positions shown · non-contrast
Comparison: 07/27/2013

CLINICAL DATA: Chest pain

EXAM:
CHEST  2 VIEW

[w chest pa]
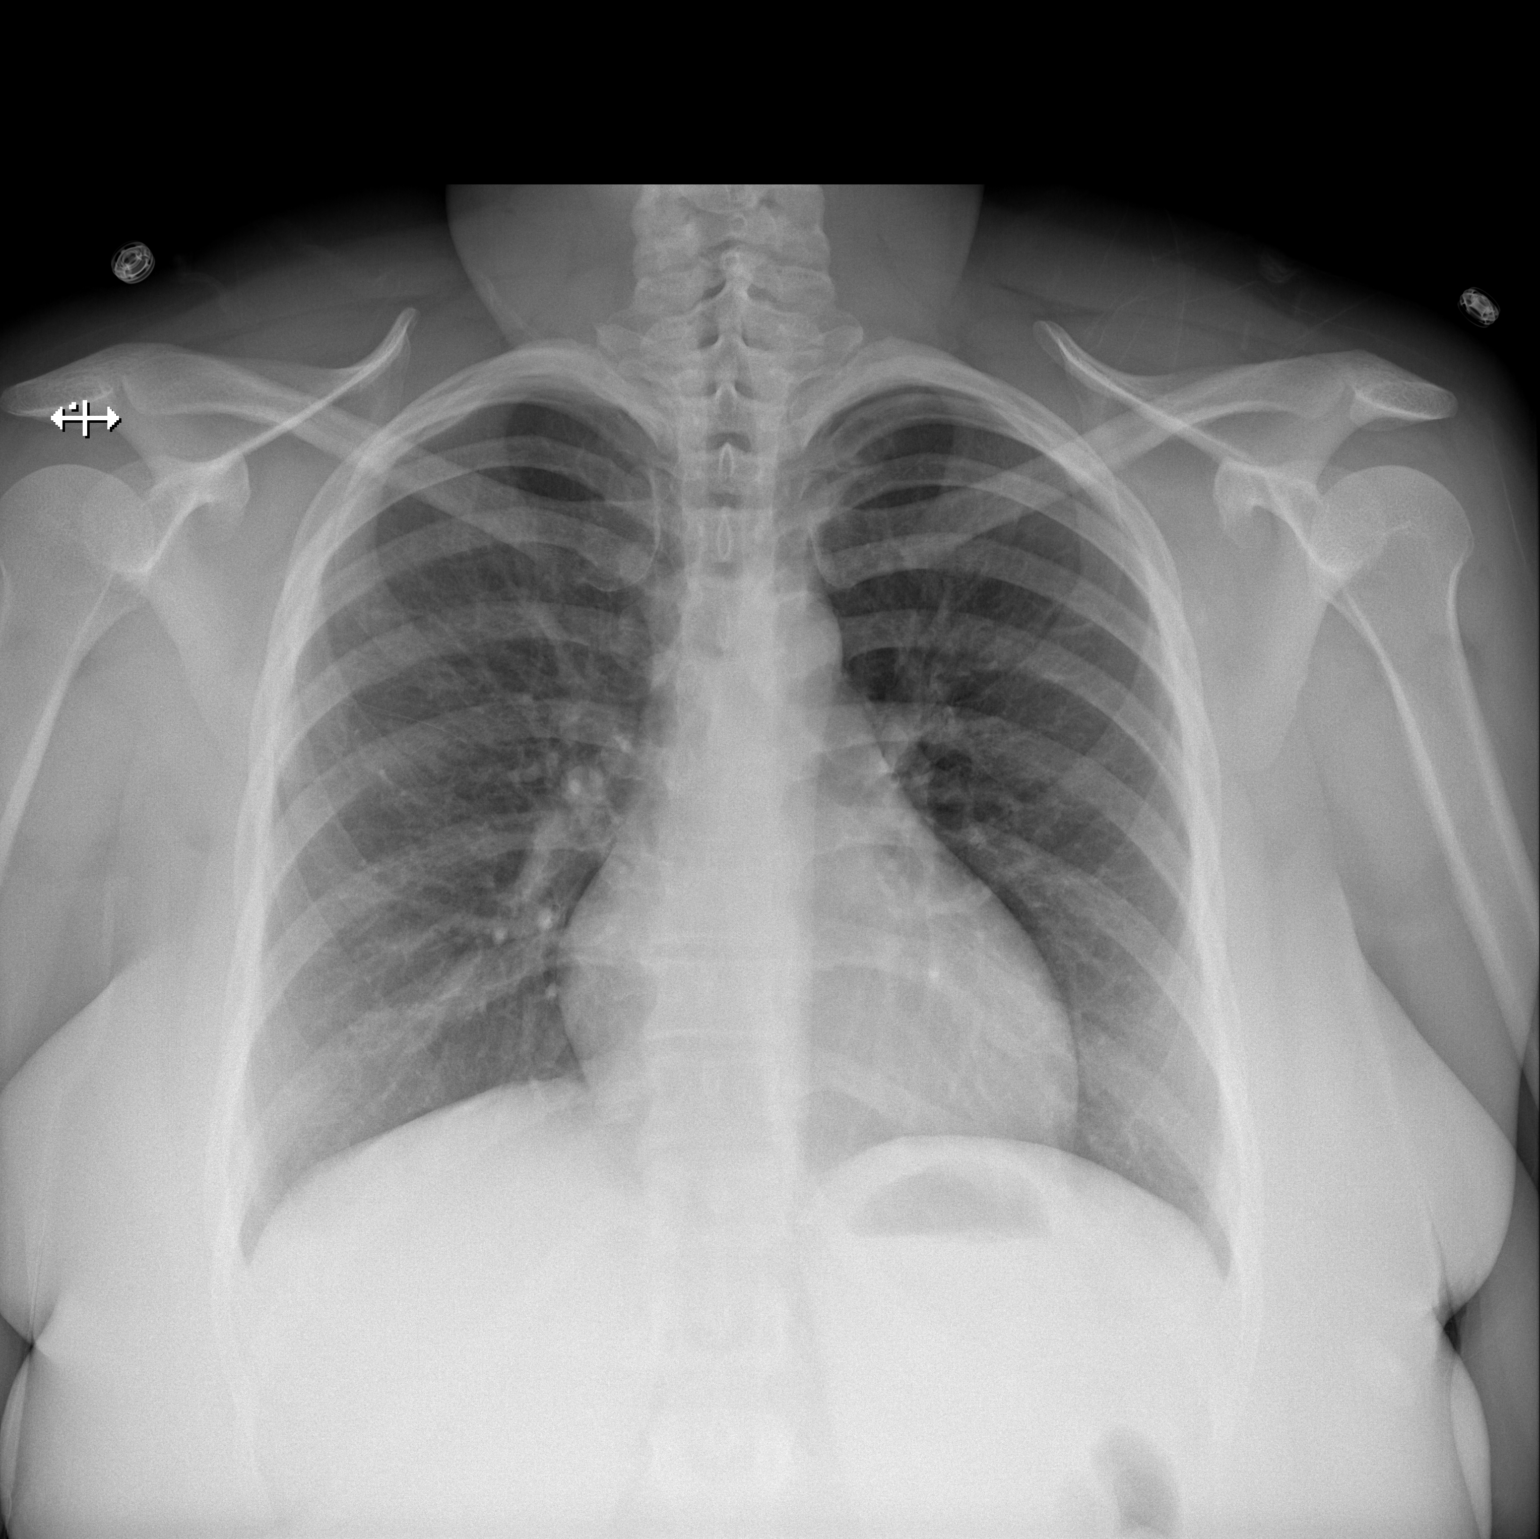

[w chest lat]
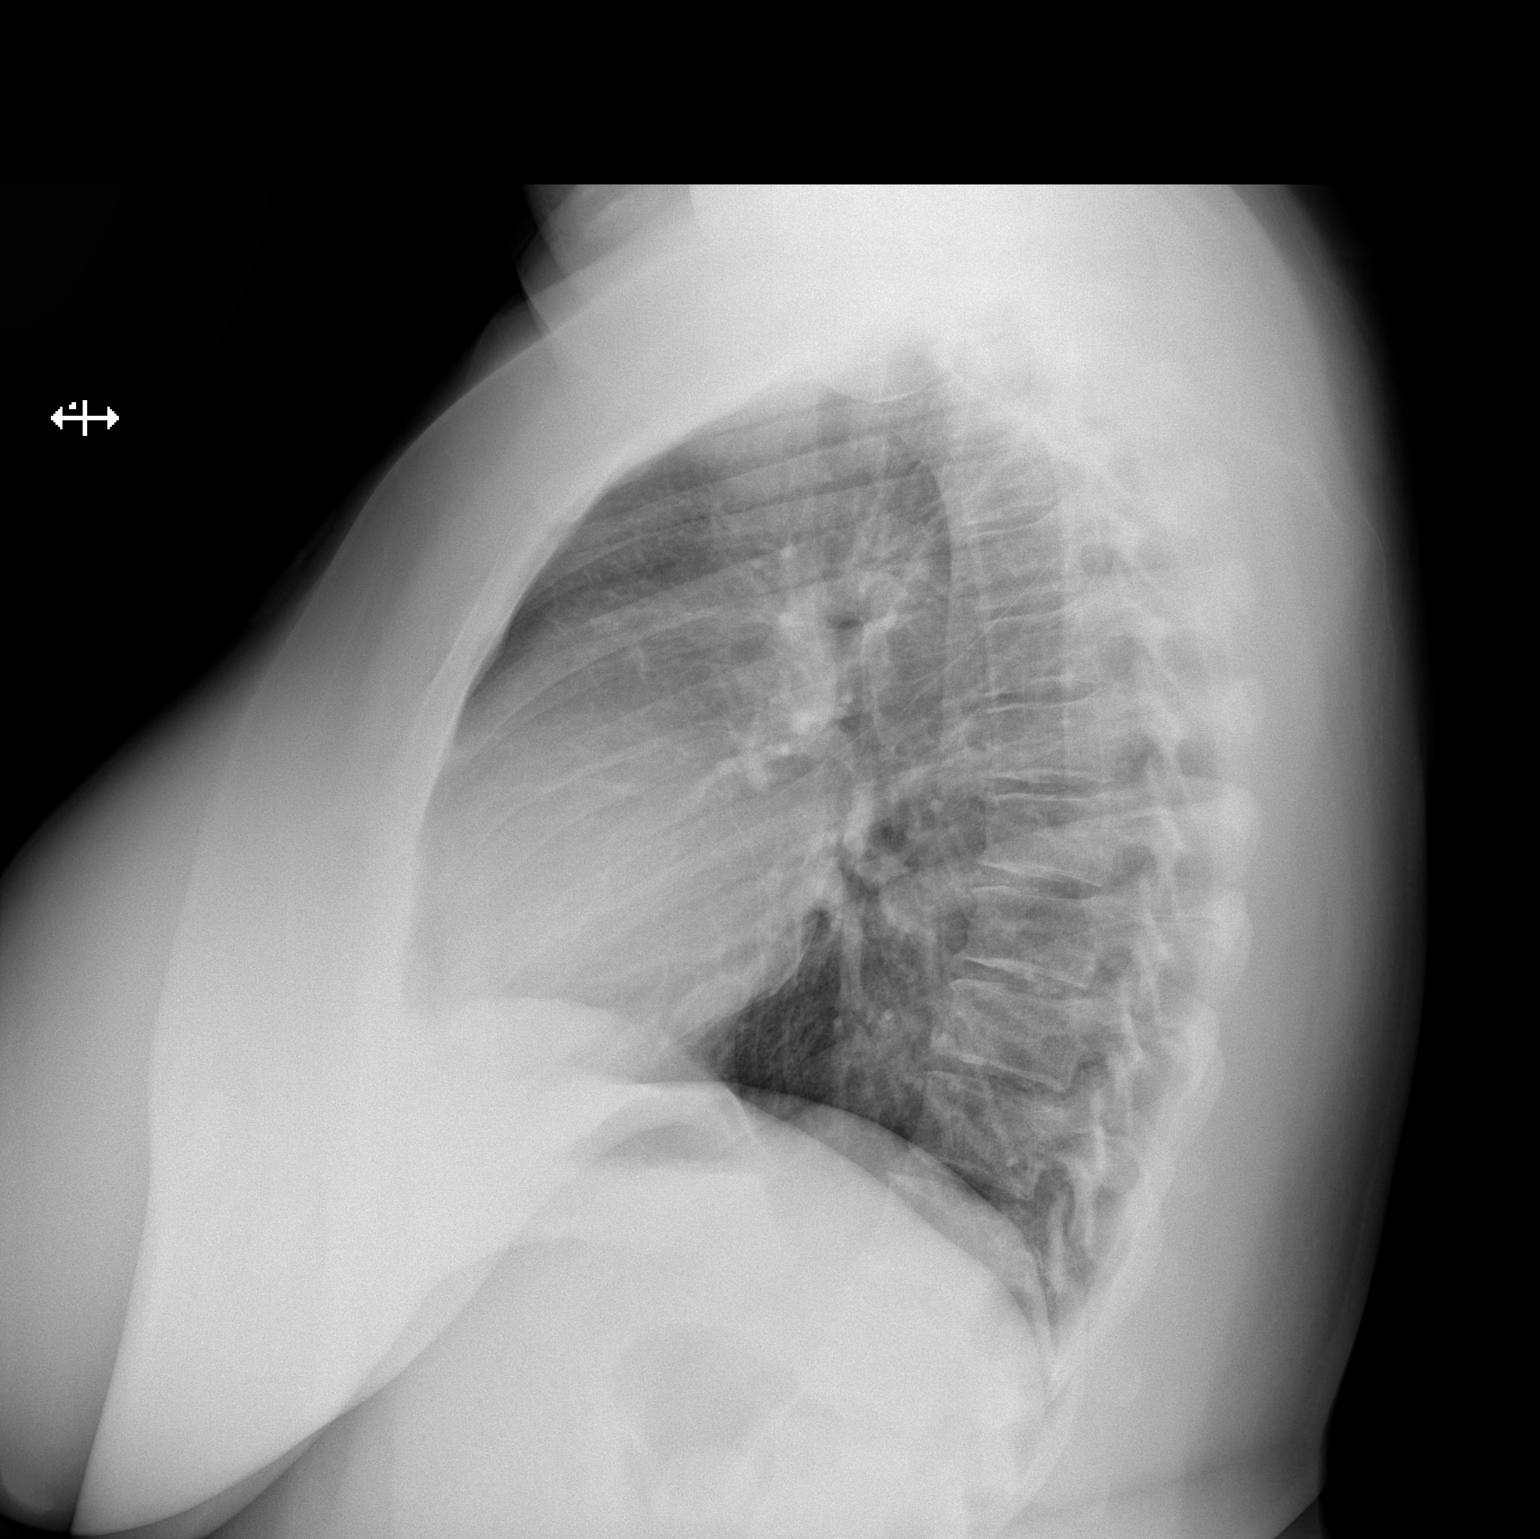

[2 of 2 positions shown; findings below may reference images not displayed]

FINDINGS: Normal mediastinum and cardiac silhouette. Normal pulmonary
vasculature. No evidence of effusion, infiltrate, or pneumothorax.
No acute bony abnormality.
IMPRESSION: No acute cardiopulmonary process.

## 2018-09-28 ENCOUNTER — Other Ambulatory Visit: Payer: Self-pay

## 2018-09-28 ENCOUNTER — Ambulatory Visit (HOSPITAL_COMMUNITY)
Admission: EM | Admit: 2018-09-28 | Discharge: 2018-09-28 | Disposition: A | Discharge status: Home / Self Care | Payer: Medicaid Other | Attending: Family Medicine | Admitting: Family Medicine

## 2018-09-28 ENCOUNTER — Encounter (HOSPITAL_COMMUNITY): Payer: Self-pay | Admitting: Emergency Medicine

## 2018-09-28 ENCOUNTER — Encounter (HOSPITAL_COMMUNITY): Payer: Self-pay

## 2018-09-28 ENCOUNTER — Emergency Department (HOSPITAL_COMMUNITY): Payer: Medicaid Other

## 2018-09-28 ENCOUNTER — Emergency Department (HOSPITAL_COMMUNITY)
Admission: EM | Admit: 2018-09-28 | Discharge: 2018-09-29 | Disposition: A | Discharge status: Home / Self Care | Payer: Medicaid Other | Attending: Emergency Medicine | Admitting: Emergency Medicine

## 2018-09-28 DIAGNOSIS — J029 Acute pharyngitis, unspecified: Secondary | ICD-10-CM | POA: Diagnosis not present

## 2018-09-28 DIAGNOSIS — Z8249 Family history of ischemic heart disease and other diseases of the circulatory system: Secondary | ICD-10-CM | POA: Insufficient documentation

## 2018-09-28 DIAGNOSIS — R079 Chest pain, unspecified: Secondary | ICD-10-CM | POA: Insufficient documentation

## 2018-09-28 DIAGNOSIS — Z20822 Contact with and (suspected) exposure to covid-19: Secondary | ICD-10-CM

## 2018-09-28 DIAGNOSIS — M1990 Unspecified osteoarthritis, unspecified site: Secondary | ICD-10-CM | POA: Diagnosis not present

## 2018-09-28 DIAGNOSIS — S29012A Strain of muscle and tendon of back wall of thorax, initial encounter: Secondary | ICD-10-CM

## 2018-09-28 DIAGNOSIS — R070 Pain in throat: Secondary | ICD-10-CM | POA: Diagnosis present

## 2018-09-28 DIAGNOSIS — Y929 Unspecified place or not applicable: Secondary | ICD-10-CM | POA: Insufficient documentation

## 2018-09-28 DIAGNOSIS — Z8041 Family history of malignant neoplasm of ovary: Secondary | ICD-10-CM | POA: Diagnosis not present

## 2018-09-28 DIAGNOSIS — Z20828 Contact with and (suspected) exposure to other viral communicable diseases: Secondary | ICD-10-CM | POA: Insufficient documentation

## 2018-09-28 DIAGNOSIS — Z9049 Acquired absence of other specified parts of digestive tract: Secondary | ICD-10-CM | POA: Diagnosis not present

## 2018-09-28 DIAGNOSIS — M542 Cervicalgia: Secondary | ICD-10-CM | POA: Insufficient documentation

## 2018-09-28 DIAGNOSIS — R109 Unspecified abdominal pain: Secondary | ICD-10-CM | POA: Diagnosis not present

## 2018-09-28 DIAGNOSIS — Z791 Long term (current) use of non-steroidal anti-inflammatories (NSAID): Secondary | ICD-10-CM | POA: Diagnosis not present

## 2018-09-28 DIAGNOSIS — F319 Bipolar disorder, unspecified: Secondary | ICD-10-CM | POA: Diagnosis not present

## 2018-09-28 DIAGNOSIS — Y939 Activity, unspecified: Secondary | ICD-10-CM | POA: Insufficient documentation

## 2018-09-28 DIAGNOSIS — Z87891 Personal history of nicotine dependence: Secondary | ICD-10-CM | POA: Insufficient documentation

## 2018-09-28 DIAGNOSIS — R6889 Other general symptoms and signs: Secondary | ICD-10-CM | POA: Diagnosis present

## 2018-09-28 DIAGNOSIS — I10 Essential (primary) hypertension: Secondary | ICD-10-CM | POA: Insufficient documentation

## 2018-09-28 DIAGNOSIS — X58XXXA Exposure to other specified factors, initial encounter: Secondary | ICD-10-CM | POA: Insufficient documentation

## 2018-09-28 DIAGNOSIS — Y999 Unspecified external cause status: Secondary | ICD-10-CM | POA: Insufficient documentation

## 2018-09-28 NOTE — ED Provider Notes (Signed)
[facility]    CSN: [id number] Arrival date & time: 09/28/18  1716      History   Chief Complaint Chief Complaint  Patient presents with  . Sore Throat  . Flank Pain    Right    HPI [name] [name] is a 28 y.o. female.   Patient presents with feeling like she needs to cough phlegm up from her chest but she has no cough or phlegm.  She denies sore throat; she refuses strep swab.  She denies fever, chills, difficulty swallowing, cough, shortness of breath, abdominal pain, vomiting, diarrhea, rash, or other symptoms.   LMP: 09/02/2018    The history is provided by the patient.    Past Medical History:  Diagnosis Date  . Arthritis   . Bipolar 1 disorder (HCC)   . Depression   . Family history of ovarian cancer   . Gallstones and inflammation of gallbladder with obstruction 10/19/2015  . Hypertension   . Suicidal overdose [facility])     Patient Active Problem List   Diagnosis Date Noted  . Genetic testing 10/08/2017  . Family history of ovarian cancer   . Cholelithiasis 11/04/2015  . Environmental allergies 05/28/2014  . Depression 04/29/2012    Past Surgical History:  Procedure Laterality Date  . CHOLECYSTECTOMY N/A 11/04/2015   Procedure: LAPAROSCOPIC CHOLECYSTECTOMY WITH INTRAOPERATIVE CHOLANGIOGRAM;  Surgeon: Ovidio Kin, MD;  Location: [facility] OR;  Service: General;  Laterality: N/A;    OB History    Gravida  1   Para  1   Term  1   Preterm  0   AB  0   Living  1     SAB  0   TAB  0   Ectopic  0   Multiple  0   Live Births  1            Home Medications    Prior to Admission medications   Medication Sig Start Date End Date Taking? Authorizing Provider  acetaminophen (TYLENOL) 500 MG tablet Take 1,000 mg by mouth every 6 (six) hours as needed for moderate pain.    [provider]  albuterol (PROVENTIL HFA;VENTOLIN HFA) 108 (90 Base) MCG/ACT inhaler Inhale 2 puffs into the lungs every 6 (six) hours as needed for wheezing or  shortness of breath.    [provider]  ibuprofen (ADVIL,MOTRIN) 800 MG tablet Take 1 tablet (800 mg total) by mouth 3 (three) times daily. 02/28/18   Eustace Moore, MD    Family History Family History  Problem Relation Age of Onset  . Heart attack Mother   . Ovarian cancer Mother   . Hypertension Father     Social History Social History   Tobacco Use  . Smoking status: Former Smoker    Packs/day: 0.50    Years: 1.00    Pack years: 0.50    Types: Cigarettes  . Smokeless tobacco: Never Used  . Tobacco comment: Quit 7 years ago  Substance Use Topics  . Alcohol use: Not Currently    Alcohol/week: 1.0 standard drinks    Types: 1 Cans of beer per week    Comment: vodka 2-3 times weekly  . Drug use: Not Currently    Types: Marijuana    Comment: Occasional     Allergies   Patient has no known allergies.   Review of Systems Review of Systems  Constitutional: Negative for chills and fever.  HENT: Negative for congestion, ear pain, rhinorrhea and sore throat.  Eyes: Negative for pain and visual disturbance.  Respiratory: Negative for cough and shortness of breath.   Cardiovascular: Negative for chest pain and palpitations.  Gastrointestinal: Negative for abdominal pain and vomiting.  Genitourinary: Negative for dysuria and hematuria.  Musculoskeletal: Negative for arthralgias and back pain.  Skin: Negative for color change and rash.  Neurological: Negative for seizures and syncope.  All other systems reviewed and are negative.    Physical Exam Triage Vital Signs ED Triage Vitals  Enc Vitals Group     BP 09/28/18 1830 (!) 131/92     Pulse Rate 09/28/18 1830 77     Resp 09/28/18 1830 16     Temp 09/28/18 1830 99.1 F (37.3 C)     Temp Source 09/28/18 1830 Oral     SpO2 09/28/18 1830 100 %     Weight --      Height --      Head Circumference --      Peak Flow --      Pain Score 09/28/18 1833 6     Pain Loc --      Pain Edu? --      Excl. in  [place]? --    No data found.  Updated Vital Signs BP (!) 131/92 (BP Location: Left Arm)   Pulse 77   Temp 99.1 F (37.3 C) (Oral)   Resp 16   LMP 09/02/2018   SpO2 100%   Visual Acuity Right Eye Distance:   Left Eye Distance:   Bilateral Distance:    Right Eye Near:   Left Eye Near:    Bilateral Near:     Physical Exam Vitals signs and nursing note reviewed.  Constitutional:      General: She is not in acute distress.    Appearance: She is well-developed. She is not ill-appearing.  HENT:     Head: Normocephalic and atraumatic.     Right Ear: Tympanic membrane normal.     Left Ear: Tympanic membrane normal.     Nose: Nose normal.     Mouth/Throat:     Mouth: Mucous membranes are moist.     Pharynx: Oropharynx is clear.  Eyes:     Conjunctiva/sclera: Conjunctivae normal.  Neck:     Musculoskeletal: Neck supple.  Cardiovascular:     Rate and Rhythm: Normal rate and regular rhythm.     Heart sounds: No murmur.  Pulmonary:     Effort: Pulmonary effort is normal. No respiratory distress.     Breath sounds: Normal breath sounds.  Abdominal:     General: Bowel sounds are normal.     Palpations: Abdomen is soft.     Tenderness: There is no abdominal tenderness. There is no right CVA tenderness, left CVA tenderness, guarding or rebound.  Skin:    General: Skin is warm and dry.     Findings: No rash.  Neurological:     Mental Status: She is alert.      UC Treatments / Results  Labs (all labs ordered are listed, but only abnormal results are displayed) Labs Reviewed  NOVEL CORONAVIRUS, NAA (HOSP ORDER, SEND-OUT TO REF LAB; TAT 18-24 HRS)    EKG   Radiology No results found.  Procedures Procedures (including critical care time)  Medications Ordered in UC Medications - No data to display  Initial Impression / Assessment and Plan / UC Course  I have reviewed the triage vital signs and the nursing notes.  Pertinent labs & imaging results that were  available  during my care of the patient were reviewed by me and considered in my medical decision making (see chart for details).    Suspected COVID.  Patient is well-appearing and her exam is unremarkable.  Discussed with patient at length that there are many things can be causing her symptom.  Discussed that she should continue to pursue evaluation with her PCP if her symptom continues.  COVID test performed here.  Instructed patient to self quarantine until her test results are back.  Instructed patient to go to the emergency department if she develops difficulty swallowing, high fever, shortness of breath, severe diarrhea, or other concerning symptoms.  Patient agrees with plan of care.    Final Clinical Impressions(s) / UC Diagnoses   Final diagnoses:  Suspected Covid-19 Virus Infection     Discharge Instructions     Your COVID test is pending.  You should self quarantine until your test result is back and is negative.    Go to the emergency department if you develop shortness of breath, high fever, severe diarrhea, or other concerning symptoms.       ED Prescriptions    None     Controlled Substance Prescriptions [place] Controlled Substance Registry consulted? Not Applicable   Mickie Bailate, Marian Meneely H, NP 09/28/18 1859

## 2018-09-28 NOTE — ED Triage Notes (Signed)
Pt presents with sore throat and right side flank pain.

## 2018-09-28 NOTE — Discharge Instructions (Addendum)
Your COVID test is pending.  You should self quarantine until your test result is back and is negative.   ° °Go to the emergency department if you develop shortness of breath, high fever, severe diarrhea, or other concerning symptoms.   ° ° ° °
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
## 2018-09-29 LAB — TROPONIN I (HIGH SENSITIVITY): Troponin I (High Sensitivity): 2 ng/L (ref <18)

## 2018-09-29 LAB — CBC
HCT: 37 % (ref 36.0–46.0)
Hemoglobin: 11.8 g/dL — ABNORMAL LOW (ref 12.0–15.0)
MCH: 28.6 pg (ref 26.0–34.0)
MCHC: 31.9 g/dL (ref 30.0–36.0)
MCV: 89.6 fL (ref 80.0–100.0)
Platelets: 258 10*3/uL (ref 150–400)
RBC: 4.13 MIL/uL (ref 3.87–5.11)
RDW: 12.6 % (ref 11.5–15.5)
WBC: 7.2 10*3/uL (ref 4.0–10.5)
nRBC: 0 % (ref 0.0–0.2)

## 2018-09-29 LAB — BASIC METABOLIC PANEL
Anion gap: 9 (ref 5–15)
BUN: 10 mg/dL (ref 6–20)
CO2: 23 mmol/L (ref 22–32)
Calcium: 9.1 mg/dL (ref 8.9–10.3)
Chloride: 103 mmol/L (ref 98–111)
Creatinine, Ser: 0.67 mg/dL (ref 0.44–1.00)
GFR calc Af Amer: >60 mL/min (ref >60)
GFR calc non Af Amer: >60 mL/min (ref >60)
Glucose, Bld: 85 mg/dL (ref 70–99)
Potassium: 3.9 mmol/L (ref 3.5–5.1)
Sodium: 135 mmol/L (ref 135–145)

## 2018-09-29 LAB — I-STAT BETA HCG BLOOD, ED (MC, WL, AP ONLY): I-stat hCG, quantitative: <5 m[IU]/mL (ref <5)

## 2018-09-29 MED ORDER — KETOROLAC TROMETHAMINE 60 MG/2ML IM SOLN
30.0000 mg | Freq: Once | INTRAMUSCULAR | Status: AC
  Administered 2018-09-29: 04:00:00 30 mg via INTRAMUSCULAR
  Filled 2018-09-29: qty 2

## 2018-09-29 NOTE — Discharge Instructions (Signed)
You may use over-the-counter Motrin (Ibuprofen), Acetaminophen (Tylenol), topical muscle creams such as SalonPas, Icy Hot, Bengay, etc. Please stretch, apply heat, and have massage therapy for additional assistance. ° °

## 2018-09-29 NOTE — ED Notes (Signed)
Discharge instructions including muscular pain management discussed with pt. Pt verbalized understanding with no questions at this time.

## 2018-09-29 NOTE — ED Notes (Signed)
ED Provider at bedside. 

## 2018-09-29 NOTE — ED Notes (Signed)
NT drawing labs. 

## 2018-09-29 NOTE — ED Provider Notes (Signed)
[facility] Provider Note  CSN: [id number] Arrival date & time: 09/28/18 2012  Chief Complaint(s) Chest Pain  HPI [name] [name] is a 28 y.o. female   [name] history is provided by [name] [name].  Chest Pain Pain location:  L chest Pain quality: stabbing   Pain severity:  Moderate Onset quality:  Gradual Duration:  2 days Timing:  Constant Progression:  Waxing and waning Chronicity:  Recurrent Relieved by:  Nothing Worsened by:  Nothing Associated symptoms: back pain (pain started in back then moved to chest)   Associated symptoms: no cough, no fever, no headache, no heartburn, no nausea and no shortness of breath     Past Medical History Past Medical History:  Diagnosis Date   Arthritis    Bipolar 1 disorder (HCC)    Depression    Family history of ovarian cancer    Gallstones and inflammation of gallbladder with obstruction 10/19/2015   Hypertension    Suicidal overdose [facility](HCC)    [name] Active Problem List   Diagnosis Date Noted   Genetic testing 10/08/2017   Family history of ovarian cancer    Cholelithiasis 11/04/2015   Environmental allergies 05/28/2014   Depression 04/29/2012   Home Medication(s) Prior to Admission medications   Medication Sig Start Date End Date Taking? Authorizing Provider  acetaminophen (TYLENOL) 500 MG tablet Take 1,000 mg by mouth every 6 (six) hours as needed for moderate pain.   Yes [provider]  albuterol (PROVENTIL HFA;VENTOLIN HFA) 108 (90 Base) MCG/ACT inhaler Inhale 2 puffs into [name] lungs every 6 (six) hours as needed for wheezing or shortness of breath.   Yes [provider]  ibuprofen (ADVIL,MOTRIN) 800 MG tablet Take 1 tablet (800 mg total) by mouth 3 (three) times daily. [name] not taking: Reported on 09/29/2018 02/28/18   Eustace MooreNelson, Yvonne Sue, MD                Past Surgical History Past Surgical History:  Procedure Laterality Date   CHOLECYSTECTOMY N/A 11/04/2015   Procedure: LAPAROSCOPIC CHOLECYSTECTOMY WITH INTRAOPERATIVE CHOLANGIOGRAM;  Surgeon: Ovidio Kinavid Newman, MD;  [facility];  Service: General;  Laterality: N/A;   Family History Family History  Problem Relation Age of Onset   Heart attack Mother    Ovarian cancer Mother    Hypertension Father     Social History Social History   Tobacco Use   Smoking status: Former Smoker    Packs/day: 0.50    Years: 1.00    Pack years: 0.50    Types: Cigarettes   Smokeless tobacco: Never Used   Tobacco comment: Quit 7 years ago  Substance Use Topics   Alcohol use: Not Currently    Alcohol/week: 1.0 standard drinks    Types: 1 Cans of beer per week    Comment: vodka 2-3 times weekly   Drug use: Not Currently    Types: Marijuana    Comment: Occasional   Allergies [name] has no known allergies.  Review of Systems Review of Systems  Constitutional: Negative for fever.  Respiratory: Negative for cough and shortness of breath.   Cardiovascular: Positive for chest pain.  Gastrointestinal: Negative for heartburn and nausea.  Musculoskeletal: Positive for back pain (pain started in back then moved to chest).  Neurological: Negative for headaches.   All other systems are reviewed and are negative for acute change except as noted in [name] HPI  Physical Exam Vital Signs  I have reviewed [name] triage vital signs BP (!) 148/95  Pulse 60    Temp 98.7 F (37.1 C) (Oral)    Resp 17    Ht 5\' 6"  (1.676 m)    Wt 97.1 kg    LMP 09/02/2018    SpO2 100%    BMI 34.54 kg/m   Physical Exam Vitals signs reviewed.  Constitutional:      General: [name] [name] is not in acute distress.    Appearance: [name] [name] is well-developed. [name] [name] is not diaphoretic.  HENT:     Head: Normocephalic and atraumatic.     Nose: Nose normal.  Eyes:     General: No scleral icterus.       Right eye: No discharge.         Left eye: No discharge.     Conjunctiva/sclera: Conjunctivae normal.     Pupils: Pupils are equal, round, and reactive to light.  Neck:     Musculoskeletal: Normal range of motion and neck supple.  Cardiovascular:     Rate and Rhythm: Normal rate and regular rhythm.     Heart sounds: No murmur. No friction rub. No gallop.   Pulmonary:     Effort: Pulmonary effort is normal. No respiratory distress.     Breath sounds: Normal breath sounds. No stridor. No rales.  Abdominal:     General: There is no distension.     Palpations: Abdomen is soft.     Tenderness: There is no abdominal tenderness.  Musculoskeletal:     Cervical back: [name] [name] exhibits tenderness and spasm.       Back:     Comments: Palpation of [name] left shoulder girdle muscles reproduces [name] [name] chest pain.  Skin:    General: Skin is warm and dry.     Findings: No erythema or rash.  Neurological:     Mental Status: [name] [name] is alert and oriented to person, place, and time.     ED Results and Treatments Labs (all labs ordered are listed, but only abnormal results are displayed) Labs Reviewed  CBC - Abnormal; Notable for [name] following components:      Result Value   Hemoglobin 11.8 (*)    All other components within normal limits  BASIC METABOLIC PANEL  I-STAT BETA HCG BLOOD, ED ([facility], [facility], [facility] ONLY)  TROPONIN I (HIGH SENSITIVITY)  TROPONIN I (HIGH SENSITIVITY)                                                                                                                         EKG  EKG Interpretation  Date/Time:  Sunday September 29 2018 02:47:35 EDT Ventricular Rate:  64 PR Interval:    QRS Duration: 87 QT Interval:  416 QTC Calculation: 430 R Axis:   61 Text Interpretation:  Sinus rhythm Borderline T wave abnormalities NO STEMI. Confirmed by Drema Pry [phone]) on 09/29/2018 3:00:12 AM      Radiology Dg Chest 2 View  Result Date: 09/28/2018 CLINICAL DATA:  Left-sided chest pain. EXAM: CHEST - 2 VIEW  COMPARISON:  Chest radiograph 08/22/2017 FINDINGS: Stable cardiac and mediastinal contours. No consolidative pulmonary opacities. No pleural effusion or pneumothorax. Regional skeleton is unremarkable. IMPRESSION: No acute cardiopulmonary process. Electronically Signed   By: Lovey Newcomer M.D.   On: 09/28/2018 21:50    Pertinent labs & imaging results that were available during my care of [name] [name] were reviewed by me and considered in my medical decision making (see chart for details).  Medications Ordered in ED Medications  ketorolac (TORADOL) injection 30 mg (30 mg Intramuscular Given 09/29/18 0414)                                                                                                                                    Procedures Procedures  (including critical care time)  Medical Decision Making / ED Course I have reviewed [name] nursing notes for this encounter and [name] [name]'s prior records (if available in EHR or on provided paperwork).   [name] [name] was evaluated in Emergency Department on 09/29/2018 for [name] symptoms described in [name] history of present illness. [name] [name] was evaluated in [name] context of [name] global COVID-19 pandemic, which necessitated consideration that [name] [name] might be at risk for infection with [name] SARS-CoV-2 virus that causes COVID-19. Institutional protocols and algorithms that pertain to [name] evaluation of patients at risk for COVID-19 are in a state of rapid change based on information released by regulatory bodies including [name] CDC and federal and state organizations. These policies and algorithms were followed during [name] [name]'s care in [name] ED.  More than 24 hours of left-sided chest and back pain most suspicious for muscle strain/spasm.  Completely reproduced with palpation. Highly atypical for ACS.  EKG without acute ischemic changes or evidence of pericarditis.  Troponin negative more than 24 hours after pain onset.  No need for repeat.  Low suspicion  for pulmonary embolism.  Presentation not classic for aortic dissection or esophageal perforation. Chest x-ray without evidence suggestive of pneumonia, pneumothorax, pneumomediastinum.  No abnormal contour of [name] mediastinum to suggest dissection. No evidence of acute injuries.   [name] [name] appears reasonably screened and/or stabilized for discharge and I doubt any other medical condition or other [facility] requiring further screening, evaluation, or treatment in [name] ED at this time prior to discharge.  [name] [name] is safe for discharge with strict return precautions.       Final Clinical Impression(s) / ED Diagnoses Final diagnoses:  Muscle strain of left upper back, initial encounter     [name] [name] appears reasonably screened and/or stabilized for discharge and I doubt any other medical condition or other [facility] requiring further screening, evaluation, or treatment in [name] ED at this time prior to discharge.  Disposition: Discharge  Condition: Good  I have discussed [name] results, Dx and Tx plan with [name] [name] who expressed understanding and agree(s) with [name] plan. Discharge instructions discussed at great length. [name] [name] was given strict return precautions who verbalized  understanding of [name] instructions. No further questions at time of discharge.    ED Discharge Orders    None        Follow Up: Primary care provider  Schedule an appointment as soon as possible for a visit  If symptoms do not improve or  worsen     This chart was dictated using voice recognition software.  Despite best efforts to proofread,  errors can occur which can change [name] documentation meaning.   Nira Connardama, Riki Gehring Eduardo, MD 09/29/18 [phone]

## 2018-09-30 ENCOUNTER — Encounter (HOSPITAL_COMMUNITY): Payer: Self-pay

## 2018-09-30 LAB — NOVEL CORONAVIRUS, NAA (HOSP ORDER, SEND-OUT TO REF LAB; TAT 18-24 HRS): SARS-CoV-2, NAA: NOT DETECTED

## 2018-10-03 ENCOUNTER — Emergency Department (HOSPITAL_COMMUNITY): Payer: No Typology Code available for payment source

## 2018-10-03 ENCOUNTER — Encounter (HOSPITAL_COMMUNITY): Payer: Self-pay | Admitting: Emergency Medicine

## 2018-10-03 ENCOUNTER — Emergency Department (HOSPITAL_COMMUNITY)
Admission: EM | Admit: 2018-10-03 | Discharge: 2018-10-03 | Disposition: A | Discharge status: Home / Self Care | Payer: No Typology Code available for payment source | Attending: Emergency Medicine | Admitting: Emergency Medicine

## 2018-10-03 ENCOUNTER — Other Ambulatory Visit: Payer: Self-pay

## 2018-10-03 DIAGNOSIS — I10 Essential (primary) hypertension: Secondary | ICD-10-CM | POA: Insufficient documentation

## 2018-10-03 DIAGNOSIS — Z79899 Other long term (current) drug therapy: Secondary | ICD-10-CM | POA: Insufficient documentation

## 2018-10-03 DIAGNOSIS — Y9389 Activity, other specified: Secondary | ICD-10-CM | POA: Insufficient documentation

## 2018-10-03 DIAGNOSIS — M79601 Pain in right arm: Secondary | ICD-10-CM | POA: Insufficient documentation

## 2018-10-03 DIAGNOSIS — Y999 Unspecified external cause status: Secondary | ICD-10-CM | POA: Diagnosis not present

## 2018-10-03 DIAGNOSIS — Z87891 Personal history of nicotine dependence: Secondary | ICD-10-CM | POA: Insufficient documentation

## 2018-10-03 DIAGNOSIS — Y92410 Unspecified street and highway as the place of occurrence of the external cause: Secondary | ICD-10-CM | POA: Insufficient documentation

## 2018-10-03 DIAGNOSIS — M25561 Pain in right knee: Secondary | ICD-10-CM | POA: Diagnosis present

## 2018-10-03 MED ORDER — NAPROXEN 500 MG PO TABS: 500.0000 mg | ORAL_TABLET | Freq: Two times a day (BID) | ORAL | 0 refills | Status: AC

## 2018-10-03 MED ORDER — METHOCARBAMOL 500 MG PO TABS: 500.0000 mg | ORAL_TABLET | Freq: Two times a day (BID) | ORAL | 0 refills | Status: AC

## 2018-10-03 MED ORDER — OXYCODONE-ACETAMINOPHEN 5-325 MG PO TABS
2.0000 | ORAL_TABLET | Freq: Once | ORAL | Status: AC
  Administered 2018-10-03: 2 via ORAL
  Filled 2018-10-03: qty 2
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
## 2018-10-03 NOTE — Discharge Instructions (Signed)
Medications sent to your pharmacy-- take as directed.  Can also use heat/ice on affected areas to help with pain. Follow-up with your primary care doctor. Return to the ED for new or worsening symptoms.

## 2018-11-25 ENCOUNTER — Telehealth (INDEPENDENT_AMBULATORY_CARE_PROVIDER_SITE_OTHER): Payer: Medicaid Other | Admitting: Primary Care

## 2018-11-25 DIAGNOSIS — Z7689 Persons encountering health services in other specified circumstances: Secondary | ICD-10-CM

## 2018-11-25 DIAGNOSIS — IMO0001 Reserved for inherently not codable concepts without codable children: Secondary | ICD-10-CM

## 2018-11-25 DIAGNOSIS — Z9109 Other allergy status, other than to drugs and biological substances: Secondary | ICD-10-CM

## 2018-11-25 DIAGNOSIS — J9801 Acute bronchospasm: Secondary | ICD-10-CM | POA: Diagnosis not present

## 2018-11-25 DIAGNOSIS — F640 Transsexualism: Secondary | ICD-10-CM | POA: Diagnosis not present

## 2018-11-25 MED ORDER — CETIRIZINE HCL 10 MG PO TABS: 10.0000 mg | ORAL_TABLET | Freq: Every day | ORAL | 11 refills | Status: AC

## 2018-11-25 MED ORDER — ALBUTEROL SULFATE HFA 108 (90 BASE) MCG/ACT IN AERS: 2.0000 | INHALATION_SPRAY | Freq: Four times a day (QID) | RESPIRATORY_TRACT | 1 refills | Status: AC | PRN
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
## 2019-01-01 ENCOUNTER — Telehealth (INDEPENDENT_AMBULATORY_CARE_PROVIDER_SITE_OTHER): Payer: Self-pay

## 2019-01-01 NOTE — Telephone Encounter (Signed)
Patient is request a referral to a allergy and asthma specialty clinic states her allergies are not getting better.  Please advice [phone]  Thank you Whitney Post

## 2019-01-01 NOTE — Telephone Encounter (Signed)
Sent to PCP ?

## 2019-07-02 IMAGING — DX DG KNEE COMPLETE 4+V*L*
4 series · 4 of 4 positions shown · non-contrast
Comparison: None.

CLINICAL DATA: Left knee pain.  Fall several weeks ago.

EXAM:
LEFT KNEE - COMPLETE 4+ VIEW

[knee ap]
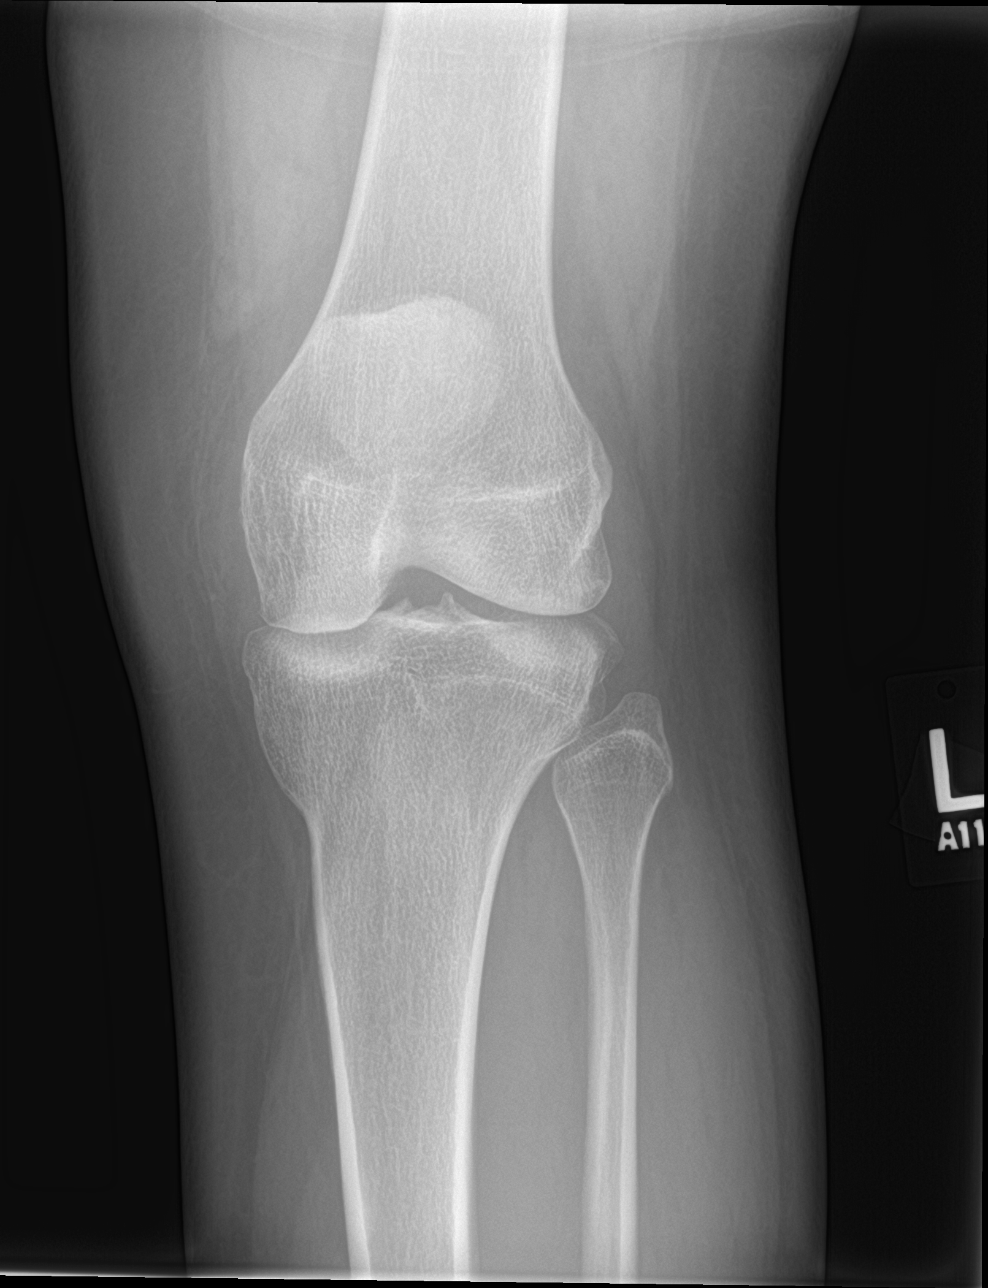

[knee obl (1 of 2)]
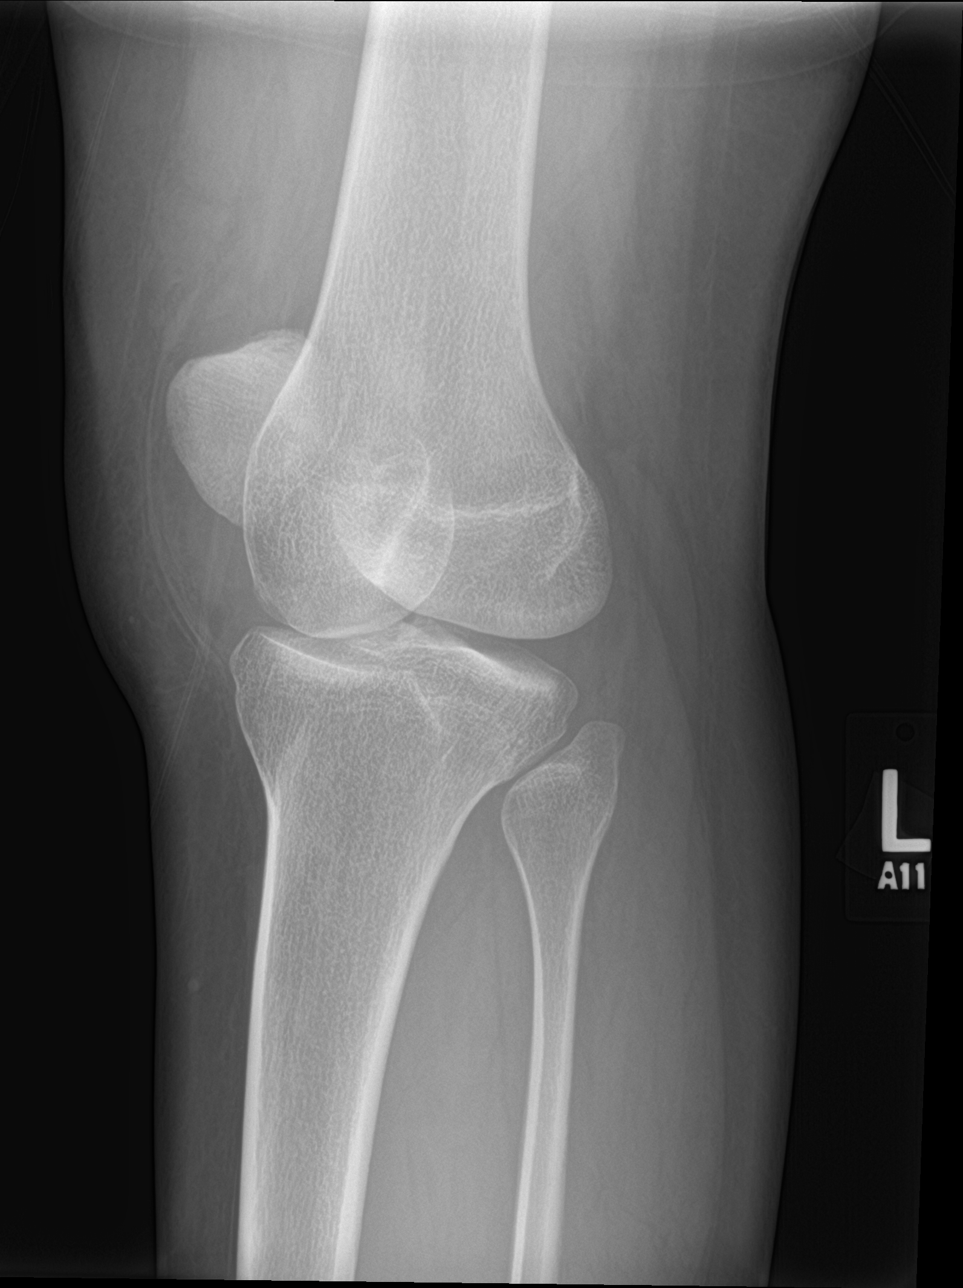

[knee obl (2 of 2)]
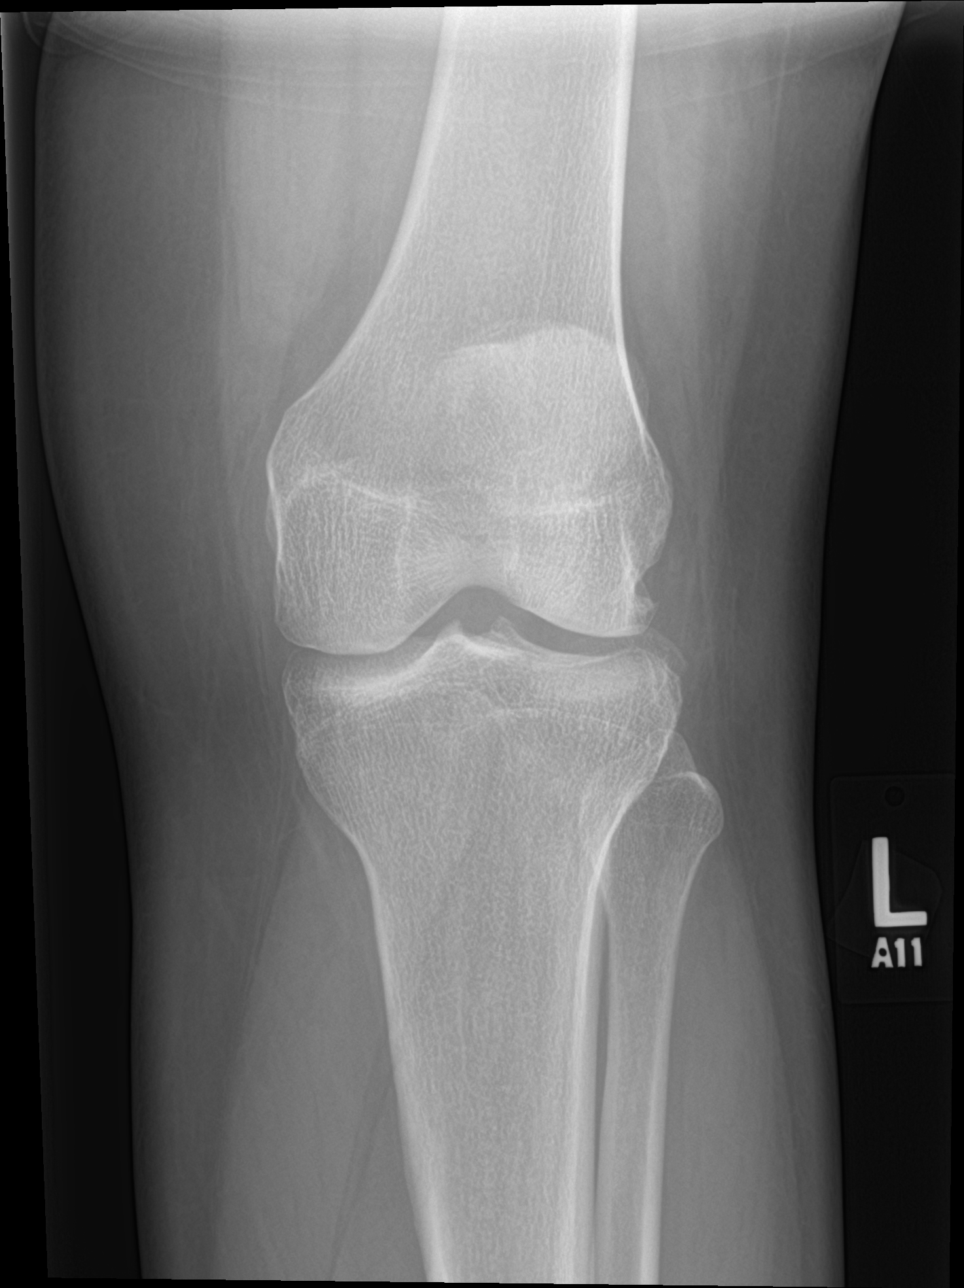

[knee lat]
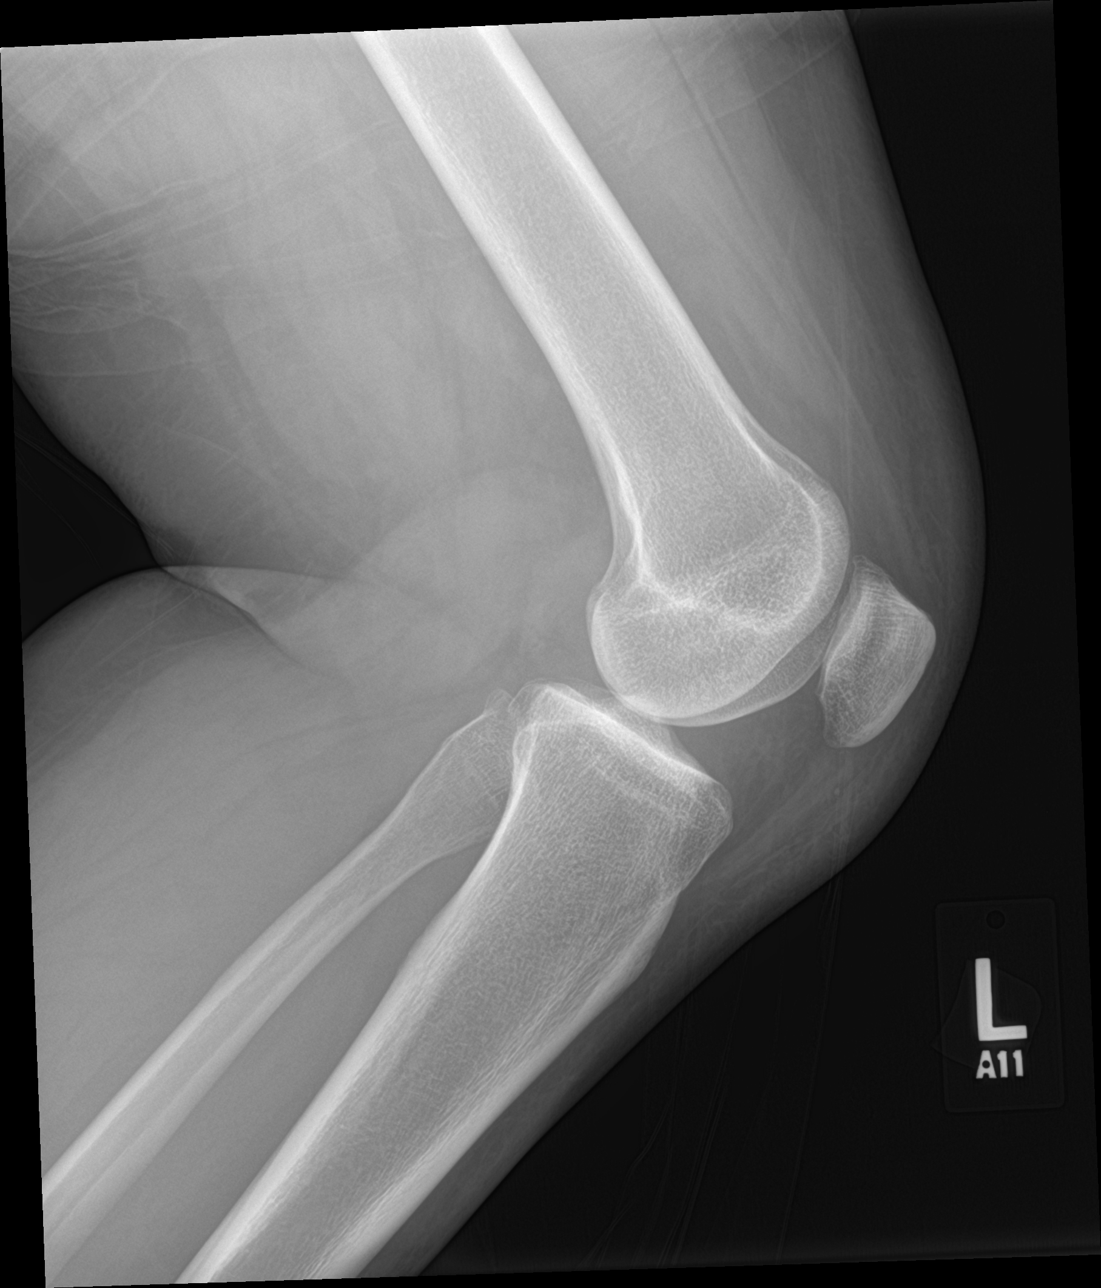

[4 of 4 positions shown; findings below may reference images not displayed]

FINDINGS: No evidence of fracture, dislocation, or joint effusion. No evidence
of arthropathy or other focal bone abnormality. Soft tissues are
unremarkable.
IMPRESSION: No fracture, dislocation or arthropathy of the left knee. No
suprapatellar effusion.
